# Patient Record
Sex: Female | Born: 1965 | Hispanic: Yes | Marital: Married | Smoking: Never smoker
Health system: Southern US, Community
[De-identification: ages and names within clinical notes are randomized; demographics above are authoritative.]

## PROBLEM LIST (undated history)

## (undated) DIAGNOSIS — M329 Systemic lupus erythematosus, unspecified: Secondary | ICD-10-CM

## (undated) DIAGNOSIS — N289 Disorder of kidney and ureter, unspecified: Secondary | ICD-10-CM

## (undated) DIAGNOSIS — IMO0002 Reserved for concepts with insufficient information to code with codable children: Secondary | ICD-10-CM

## (undated) DIAGNOSIS — Z970 Presence of artificial eye: Secondary | ICD-10-CM

---

## 2007-12-07 DIAGNOSIS — E669 Obesity, unspecified: Secondary | ICD-10-CM | POA: Insufficient documentation

## 2007-12-07 DIAGNOSIS — I10 Essential (primary) hypertension: Secondary | ICD-10-CM | POA: Insufficient documentation

## 2008-02-12 ENCOUNTER — Ambulatory Visit: Payer: Self-pay

## 2010-03-20 ENCOUNTER — Other Ambulatory Visit: Payer: Self-pay | Admitting: Family Medicine

## 2018-07-11 ENCOUNTER — Other Ambulatory Visit: Payer: Self-pay | Admitting: Internal Medicine

## 2018-07-11 DIAGNOSIS — Z1231 Encounter for screening mammogram for malignant neoplasm of breast: Secondary | ICD-10-CM

## 2019-09-20 ENCOUNTER — Other Ambulatory Visit: Payer: Self-pay | Admitting: Internal Medicine

## 2019-09-20 DIAGNOSIS — Z1231 Encounter for screening mammogram for malignant neoplasm of breast: Secondary | ICD-10-CM

## 2019-10-07 DIAGNOSIS — N184 Chronic kidney disease, stage 4 (severe): Secondary | ICD-10-CM | POA: Insufficient documentation

## 2019-11-01 ENCOUNTER — Other Ambulatory Visit: Payer: Self-pay

## 2019-11-01 ENCOUNTER — Ambulatory Visit
Admission: RE | Admit: 2019-11-01 | Discharge: 2019-11-01 | Disposition: A | Discharge status: Home / Self Care | Payer: BC Managed Care – PPO | Source: Ambulatory Visit | Attending: Internal Medicine | Admitting: Internal Medicine

## 2019-11-01 DIAGNOSIS — Z1231 Encounter for screening mammogram for malignant neoplasm of breast: Secondary | ICD-10-CM | POA: Diagnosis present

## 2020-01-17 ENCOUNTER — Other Ambulatory Visit: Payer: Self-pay | Admitting: Nephrology

## 2020-01-17 DIAGNOSIS — N184 Chronic kidney disease, stage 4 (severe): Secondary | ICD-10-CM

## 2020-01-28 ENCOUNTER — Other Ambulatory Visit: Payer: Self-pay

## 2020-01-28 DIAGNOSIS — Z1231 Encounter for screening mammogram for malignant neoplasm of breast: Secondary | ICD-10-CM

## 2020-05-07 DIAGNOSIS — N049 Nephrotic syndrome with unspecified morphologic changes: Secondary | ICD-10-CM | POA: Insufficient documentation

## 2020-06-09 ENCOUNTER — Other Ambulatory Visit: Payer: Self-pay

## 2020-06-09 ENCOUNTER — Inpatient Hospital Stay
Admission: EM | Admit: 2020-06-09 | Discharge: 2020-06-22 | DRG: 640 | Disposition: A | Discharge status: Home Health | Payer: BC Managed Care – PPO | Attending: Internal Medicine | Admitting: Internal Medicine

## 2020-06-09 ENCOUNTER — Emergency Department: Payer: BC Managed Care – PPO

## 2020-06-09 DIAGNOSIS — G4700 Insomnia, unspecified: Secondary | ICD-10-CM | POA: Diagnosis present

## 2020-06-09 DIAGNOSIS — N185 Chronic kidney disease, stage 5: Secondary | ICD-10-CM | POA: Diagnosis not present

## 2020-06-09 DIAGNOSIS — J181 Lobar pneumonia, unspecified organism: Secondary | ICD-10-CM | POA: Diagnosis not present

## 2020-06-09 DIAGNOSIS — Z970 Presence of artificial eye: Secondary | ICD-10-CM | POA: Diagnosis not present

## 2020-06-09 DIAGNOSIS — R569 Unspecified convulsions: Secondary | ICD-10-CM | POA: Diagnosis present

## 2020-06-09 DIAGNOSIS — D631 Anemia in chronic kidney disease: Secondary | ICD-10-CM | POA: Diagnosis present

## 2020-06-09 DIAGNOSIS — R739 Hyperglycemia, unspecified: Secondary | ICD-10-CM | POA: Diagnosis present

## 2020-06-09 DIAGNOSIS — N179 Acute kidney failure, unspecified: Secondary | ICD-10-CM | POA: Diagnosis present

## 2020-06-09 DIAGNOSIS — M329 Systemic lupus erythematosus, unspecified: Secondary | ICD-10-CM | POA: Diagnosis not present

## 2020-06-09 DIAGNOSIS — Y92003 Bedroom of unspecified non-institutional (private) residence as the place of occurrence of the external cause: Secondary | ICD-10-CM

## 2020-06-09 DIAGNOSIS — D638 Anemia in other chronic diseases classified elsewhere: Secondary | ICD-10-CM | POA: Diagnosis not present

## 2020-06-09 DIAGNOSIS — D72829 Elevated white blood cell count, unspecified: Secondary | ICD-10-CM | POA: Diagnosis not present

## 2020-06-09 DIAGNOSIS — J189 Pneumonia, unspecified organism: Secondary | ICD-10-CM | POA: Diagnosis present

## 2020-06-09 DIAGNOSIS — M3214 Glomerular disease in systemic lupus erythematosus: Secondary | ICD-10-CM | POA: Diagnosis present

## 2020-06-09 DIAGNOSIS — I248 Other forms of acute ischemic heart disease: Secondary | ICD-10-CM | POA: Diagnosis present

## 2020-06-09 DIAGNOSIS — Z6830 Body mass index (BMI) 30.0-30.9, adult: Secondary | ICD-10-CM

## 2020-06-09 DIAGNOSIS — N186 End stage renal disease: Secondary | ICD-10-CM | POA: Diagnosis present

## 2020-06-09 DIAGNOSIS — W19XXXA Unspecified fall, initial encounter: Secondary | ICD-10-CM | POA: Diagnosis present

## 2020-06-09 DIAGNOSIS — E878 Other disorders of electrolyte and fluid balance, not elsewhere classified: Secondary | ICD-10-CM | POA: Diagnosis present

## 2020-06-09 DIAGNOSIS — E871 Hypo-osmolality and hyponatremia: Principal | ICD-10-CM | POA: Diagnosis present

## 2020-06-09 DIAGNOSIS — F13288 Sedative, hypnotic or anxiolytic dependence with other sedative, hypnotic or anxiolytic-induced disorder: Secondary | ICD-10-CM | POA: Diagnosis not present

## 2020-06-09 DIAGNOSIS — T502X5A Adverse effect of carbonic-anhydrase inhibitors, benzothiadiazides and other diuretics, initial encounter: Secondary | ICD-10-CM | POA: Diagnosis present

## 2020-06-09 DIAGNOSIS — I120 Hypertensive chronic kidney disease with stage 5 chronic kidney disease or end stage renal disease: Secondary | ICD-10-CM | POA: Diagnosis present

## 2020-06-09 DIAGNOSIS — E872 Acidosis: Secondary | ICD-10-CM | POA: Diagnosis present

## 2020-06-09 DIAGNOSIS — Z20822 Contact with and (suspected) exposure to covid-19: Secondary | ICD-10-CM | POA: Diagnosis present

## 2020-06-09 DIAGNOSIS — Y999 Unspecified external cause status: Secondary | ICD-10-CM | POA: Diagnosis not present

## 2020-06-09 DIAGNOSIS — R630 Anorexia: Secondary | ICD-10-CM | POA: Diagnosis present

## 2020-06-09 DIAGNOSIS — R410 Disorientation, unspecified: Secondary | ICD-10-CM | POA: Diagnosis not present

## 2020-06-09 DIAGNOSIS — Z8616 Personal history of COVID-19: Secondary | ICD-10-CM | POA: Diagnosis not present

## 2020-06-09 DIAGNOSIS — N2581 Secondary hyperparathyroidism of renal origin: Secondary | ICD-10-CM | POA: Diagnosis present

## 2020-06-09 DIAGNOSIS — R011 Cardiac murmur, unspecified: Secondary | ICD-10-CM | POA: Diagnosis not present

## 2020-06-09 DIAGNOSIS — F13239 Sedative, hypnotic or anxiolytic dependence with withdrawal, unspecified: Secondary | ICD-10-CM | POA: Diagnosis not present

## 2020-06-09 HISTORY — DX: Presence of artificial eye: Z97.0

## 2020-06-09 HISTORY — DX: Systemic lupus erythematosus, unspecified: M32.9

## 2020-06-09 HISTORY — DX: Reserved for concepts with insufficient information to code with codable children: IMO0002

## 2020-06-09 HISTORY — DX: Disorder of kidney and ureter, unspecified: N28.9

## 2020-06-09 LAB — CBC WITH DIFFERENTIAL/PLATELET
Abs Immature Granulocytes: 0.14 10*3/uL — ABNORMAL HIGH (ref 0.00–0.07)
Basophils Absolute: 0 10*3/uL (ref 0.0–0.1)
Basophils Relative: 0 %
Eosinophils Absolute: 0 10*3/uL (ref 0.0–0.5)
Eosinophils Relative: 0 %
HCT: 24 % — ABNORMAL LOW (ref 36.0–46.0)
Hemoglobin: 8.8 g/dL — ABNORMAL LOW (ref 12.0–15.0)
Immature Granulocytes: 1 %
Lymphocytes Relative: 3 %
Lymphs Abs: 0.4 10*3/uL — ABNORMAL LOW (ref 0.7–4.0)
MCH: 27.3 pg (ref 26.0–34.0)
MCHC: 36.7 g/dL — ABNORMAL HIGH (ref 30.0–36.0)
MCV: 74.5 fL — ABNORMAL LOW (ref 80.0–100.0)
Monocytes Absolute: 0.5 10*3/uL (ref 0.1–1.0)
Monocytes Relative: 4 %
Neutro Abs: 11.7 10*3/uL — ABNORMAL HIGH (ref 1.7–7.7)
Neutrophils Relative %: 92 %
Platelets: 440 10*3/uL — ABNORMAL HIGH (ref 150–400)
RBC: 3.22 MIL/uL — ABNORMAL LOW (ref 3.87–5.11)
RDW: 11.5 % (ref 11.5–15.5)
WBC: 12.7 10*3/uL — ABNORMAL HIGH (ref 4.0–10.5)
nRBC: 0 % (ref 0.0–0.2)

## 2020-06-09 LAB — COMPREHENSIVE METABOLIC PANEL
ALT: 12 U/L (ref 0–44)
AST: 28 U/L (ref 15–41)
Albumin: 2.3 g/dL — ABNORMAL LOW (ref 3.5–5.0)
Alkaline Phosphatase: 87 U/L (ref 38–126)
Anion gap: 18 — ABNORMAL HIGH (ref 5–15)
BUN: 111 mg/dL — ABNORMAL HIGH (ref 6–20)
CO2: 18 mmol/L — ABNORMAL LOW (ref 22–32)
Calcium: 6.9 mg/dL — ABNORMAL LOW (ref 8.9–10.3)
Chloride: 69 mmol/L — ABNORMAL LOW (ref 98–111)
Creatinine, Ser: 6.24 mg/dL — ABNORMAL HIGH (ref 0.44–1.00)
GFR, Estimated: 7 mL/min — ABNORMAL LOW (ref >60)
Glucose, Bld: 150 mg/dL — ABNORMAL HIGH (ref 70–99)
Potassium: 4.4 mmol/L (ref 3.5–5.1)
Sodium: 105 mmol/L — CL (ref 135–145)
Total Bilirubin: 0.6 mg/dL (ref 0.3–1.2)
Total Protein: 6.5 g/dL (ref 6.5–8.1)

## 2020-06-09 LAB — RESP PANEL BY RT-PCR (FLU A&B, COVID) ARPGX2
Influenza A by PCR: NEGATIVE
Influenza B by PCR: NEGATIVE
SARS Coronavirus 2 by RT PCR: NEGATIVE

## 2020-06-09 LAB — TROPONIN I (HIGH SENSITIVITY)
Troponin I (High Sensitivity): 20 ng/L — ABNORMAL HIGH (ref <18)
Troponin I (High Sensitivity): 16 ng/L (ref <18)

## 2020-06-09 LAB — CBC
HCT: 22.3 % — ABNORMAL LOW (ref 36.0–46.0)
Hemoglobin: 8.1 g/dL — ABNORMAL LOW (ref 12.0–15.0)
MCH: 27.5 pg (ref 26.0–34.0)
MCHC: 36.3 g/dL — ABNORMAL HIGH (ref 30.0–36.0)
MCV: 75.6 fL — ABNORMAL LOW (ref 80.0–100.0)
Platelets: 416 10*3/uL — ABNORMAL HIGH (ref 150–400)
RBC: 2.95 MIL/uL — ABNORMAL LOW (ref 3.87–5.11)
RDW: 11.6 % (ref 11.5–15.5)
WBC: 12.1 10*3/uL — ABNORMAL HIGH (ref 4.0–10.5)
nRBC: 0 % (ref 0.0–0.2)

## 2020-06-09 LAB — SODIUM: Sodium: 107 mmol/L — CL (ref 135–145)

## 2020-06-09 LAB — LACTIC ACID, PLASMA: Lactic Acid, Venous: 1.9 mmol/L (ref 0.5–1.9)

## 2020-06-09 LAB — OSMOLALITY: Osmolality: 259 mOsm/kg — ABNORMAL LOW (ref 275–295)

## 2020-06-09 LAB — CREATININE, SERUM
Creatinine, Ser: 5.88 mg/dL — ABNORMAL HIGH (ref 0.44–1.00)
GFR, Estimated: 8 mL/min — ABNORMAL LOW (ref >60)

## 2020-06-09 MED ORDER — HEPARIN SODIUM (PORCINE) 5000 UNIT/ML IJ SOLN
5000.0000 [IU] | Freq: Three times a day (TID) | INTRAMUSCULAR | Status: DC
  Administered 2020-06-09 – 2020-06-22 (×36): 5000 [IU] via SUBCUTANEOUS
  Filled 2020-06-09 (×36): qty 1

## 2020-06-09 MED ORDER — SODIUM CHLORIDE 3 % IV BOLUS
250.0000 mL | Freq: Once | INTRAVENOUS | Status: DC
  Filled 2020-06-09: qty 500

## 2020-06-09 MED ORDER — POLYETHYLENE GLYCOL 3350 17 G PO PACK
17.0000 g | PACK | Freq: Every day | ORAL | Status: DC | PRN
  Administered 2020-06-13 – 2020-06-14 (×2): 17 g via ORAL
  Filled 2020-06-09 (×2): qty 1

## 2020-06-09 MED ORDER — LORAZEPAM 2 MG/ML IJ SOLN: 1.0000 mg | INTRAMUSCULAR | Status: DC | PRN

## 2020-06-09 MED ORDER — SODIUM CHLORIDE 0.9 % IV SOLN: INTRAVENOUS | Status: DC

## 2020-06-09 MED ORDER — SODIUM CHLORIDE 3 % IV BOLUS
100.0000 mL | Freq: Once | INTRAVENOUS | Status: AC
  Administered 2020-06-09: 100 mL via INTRAVENOUS
  Filled 2020-06-09: qty 500

## 2020-06-09 MED ORDER — ACETAMINOPHEN 325 MG PO TABS
650.0000 mg | ORAL_TABLET | ORAL | Status: DC | PRN
  Administered 2020-06-12: 650 mg via ORAL
  Filled 2020-06-09: qty 2

## 2020-06-09 MED ORDER — DOCUSATE SODIUM 100 MG PO CAPS
100.0000 mg | ORAL_CAPSULE | Freq: Two times a day (BID) | ORAL | Status: DC | PRN
  Administered 2020-06-12 – 2020-06-13 (×2): 100 mg via ORAL
  Filled 2020-06-09 (×2): qty 1

## 2020-06-09 MED ORDER — ONDANSETRON HCL 4 MG/2ML IJ SOLN
4.0000 mg | Freq: Four times a day (QID) | INTRAMUSCULAR | Status: DC | PRN
  Administered 2020-06-13: 4 mg via INTRAVENOUS
  Filled 2020-06-09: qty 2

## 2020-06-09 MED ORDER — LACTATED RINGERS IV BOLUS
1000.0000 mL | Freq: Once | INTRAVENOUS | Status: AC
  Administered 2020-06-09: 1000 mL via INTRAVENOUS

## 2020-06-09 NOTE — ED Triage Notes (Addendum)
Pt arrived via [facility] from home with c/o seizure. Per EMS, pt seen recently for respiratory infection and was given new medication. Per EMS, pt daughter states pt has not been acting the same since taking it (2 weeks).   Per pt daughter, pt was getting ready to get up and had a seizure that lasted 2-3 minutes from a sitting up position. Per pt daughter, pt eyes rolled back and both arms started shaking. Per daughter, pt was very sleepy afterwards. Per pt daughter, pt is acting more normal at this time  Per EMS, VSS  Per EMS, pt has hx of lupus and kidney disease.

## 2020-06-09 NOTE — ED Provider Notes (Signed)
[facility] Emergency Department Provider Note ____________________________________________   Event Date/Time   First MD Initiated Contact with Patient 06/09/20 1751     (approximate)  I have reviewed the triage vital signs and the nursing notes.  HISTORY  Chief Complaint Seizures   HPI Carol Hicks is a 55 y.o. femalewho presents to the ED for evaluation of seizure versus syncope.  Chart review indicates recent admission 1 month ago at [facility] due to increased swelling, found to have significant proteinuria, and had a renal biopsy with subsequent diagnosis of lupus.  She was started on Bumex, CellCept Diagnosed with an acute COVID-19 infection during this hospitalization. Later started on prednisone (10 days) and Plaquenil 3-4 days ago by nephrologist due to patient calling in with polyarthralgias and swelling.  Patient presents to the ED via EMS from home due to seizure versus syncope.  History obtained by daughter at the bedside, and patient.  Daughter reports that patient was lying supine, sat up on the side of the side of the bed and promptly fell backwards, trembling/shaking to her bilateral upper extremities symmetrically, and her eyes rolled back in her head.  Daughter reports this lasted 2-3 minutes, self resolved, and patient was sleepy afterwards for about 5-10 minutes, before returning to baseline.  Daughter reports that patient is at baseline now.  Patient reports that she has no recollection of what happened.  She reports that she has had emesis and loose stools over the past 24-36 hours.  She reports poor sleep, sleeping 1-2 hours per night for the past 2 weeks.  Denies fevers.  This has never happened before.  No past medical history on file.  Patient Active Problem List   Diagnosis Date Noted  . Hyponatremia 06/09/2020    No past surgical history on file.  Prior to Admission medications   Not on File    Allergies Patient has no known  allergies.  No family history on file.  Social History    Review of Systems  Constitutional: No fever/chills Eyes: No visual changes. ENT: No sore throat. Cardiovascular: Denies chest pain. Respiratory: Denies shortness of breath. Gastrointestinal: No abdominal pain.  No nausea, no vomiting.  No diarrhea.  No constipation. Genitourinary: Negative for dysuria. Musculoskeletal: Negative for back pain. Skin: Negative for rash. Neurological: Negative for headaches, focal weakness or numbness.  Positive for seizure versus syncope.   ____________________________________________   PHYSICAL EXAM:  VITAL SIGNS: Vitals:   06/09/20 1748 06/09/20 1830  BP: 137/75 134/63  Pulse:  87  Resp:  (!) 25  Temp:    SpO2:  100%     Constitutional: Alert and oriented. Well appearing and in no acute distress. Eyes: Conjunctivae are normal. PERRL. EOMI. Head: Atraumatic. Nose: No congestion/rhinnorhea. Mouth/Throat: Mucous membranes are dry.  Oropharynx non-erythematous. Small amount of blood noted in the right side of the tongue, but no significant laceration or active bleeding. Neck: No stridor. No cervical spine tenderness to palpation. Cardiovascular: Normal rate, regular rhythm. Grossly normal heart sounds.  Good peripheral circulation. Respiratory: Normal respiratory effort.  No retractions. Lungs CTAB. Gastrointestinal: Soft , nondistended, nontender to palpation. No CVA tenderness. Musculoskeletal:  No joint effusions. No signs of acute trauma. Pitting edema to bilateral lower extremities up to the knees symmetrically, no overlying skin changes. Neurologic:   No gross focal neurologic deficits are appreciated.  Cranial nerves II through XII intact 5/5 strength and sensation in all 4 extremities Skin:  Skin is warm, dry and intact. No  rash noted. Psychiatric: Mood and affect are normal. Speech and behavior are normal.  ____________________________________________   LABS (all  labs ordered are listed, but only abnormal results are displayed)  Labs Reviewed  COMPREHENSIVE METABOLIC PANEL - Abnormal; Notable for the following components:      Result Value   Sodium 105 (*)    Chloride 69 (*)    CO2 18 (*)    Glucose, Bld 150 (*)    BUN 111 (*)    Creatinine, Ser 6.24 (*)    Calcium 6.9 (*)    Albumin 2.3 (*)    GFR, Estimated 7 (*)    Anion gap 18 (*)    All other components within normal limits  CBC WITH DIFFERENTIAL/PLATELET - Abnormal; Notable for the following components:   WBC 12.7 (*)    RBC 3.22 (*)    Hemoglobin 8.8 (*)    HCT 24.0 (*)    MCV 74.5 (*)    MCHC 36.7 (*)    Platelets 440 (*)    Neutro Abs 11.7 (*)    Lymphs Abs 0.4 (*)    Abs Immature Granulocytes 0.14 (*)    All other components within normal limits  TROPONIN I (HIGH SENSITIVITY) - Abnormal; Notable for the following components:   Troponin I (High Sensitivity) 20 (*)    All other components within normal limits  RESP PANEL BY RT-PCR (FLU A&B, COVID) ARPGX2  LACTIC ACID, PLASMA  URINALYSIS, COMPLETE (UACMP) WITH MICROSCOPIC  HIV ANTIBODY (ROUTINE TESTING W REFLEX)  CBC  CREATININE, SERUM  CBC  BASIC METABOLIC PANEL  MAGNESIUM  PHOSPHORUS  SODIUM  OSMOLALITY  OSMOLALITY, URINE  SODIUM, URINE, RANDOM  TROPONIN I (HIGH SENSITIVITY)   ____________________________________________  12 Lead EKG   ____________________________________________  RADIOLOGY  ED MD interpretation: No evidence of acute intracranial pathology.  Official radiology report(s): CT Head Wo Contrast  Result Date: 06/09/2020 CLINICAL DATA:  Mental status change. EXAM: CT HEAD WITHOUT CONTRAST TECHNIQUE: Contiguous axial images were obtained from the base of the skull through the vertex without intravenous contrast. COMPARISON:  None. FINDINGS: Brain: The ventricles are in the midline without mass effect or shift. No extra-axial fluid collections are identified. The gray-white differentiation is  maintained. No findings for acute intracranial process such as hemorrhage or infarction. No mass lesions. The brainstem and cerebellum are unremarkable. Are few tiny scattered parenchymal calcifications which could be due to remote inflammation or infection. Vascular: Scattered age advanced vascular calcifications. No aneurysm or hyperdense vessels. Skull: No skull fracture or bone lesions. Sinuses/Orbits: Chronic appearing right-sided maxillary sinusitis with a thickened sinus wall. There is also mild right-sided ethmoid sinus disease. 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 Other: No scalp lesions or scalp hematoma. IMPRESSION: 1. No acute intracranial findings or mass lesions. 2. Chronic appearing right-sided maxillary sinusitis. Electronically Signed   By: Marijo Sanes M.D.   On: 06/09/2020 18:40    ____________________________________________   PROCEDURES and INTERVENTIONS  Procedure(s) performed (including Critical Care):  .1-3 Lead EKG Interpretation Performed by: Vladimir Crofts, MD Authorized by: Vladimir Crofts, MD     Interpretation: normal     ECG rate:  80   ECG rate assessment: normal     Rhythm: sinus rhythm     Ectopy: none     Conduction: normal    Ultrasound ED Peripheral IV (Provider)  Date/Time: 06/09/2020 7:38 PM Performed by: Vladimir Crofts, MD Authorized by: Vladimir Crofts, MD  Procedure details:    Indications: hydration and poor IV access     Skin Prep: chlorhexidine gluconate     Location: Left basilic vein.   Angiocath:  20 G   Bedside Ultrasound Guided: Yes     Images: not archived     Patient tolerated procedure without complications: Yes     Dressing applied: Yes   .Critical Care Performed by: Vladimir Crofts, MD Authorized by: Vladimir Crofts, MD   Critical care provider statement:    Critical care time (minutes):  45   Critical care was necessary to treat or prevent imminent or life-threatening  deterioration of the following conditions:  CNS failure or compromise, dehydration and metabolic crisis   Critical care was time spent personally by me on the following activities:  Discussions with consultants, evaluation of patient's response to treatment, examination of patient, ordering and performing treatments and interventions, ordering and review of laboratory studies, ordering and review of radiographic studies, pulse oximetry, re-evaluation of patient's condition, obtaining history from patient or surrogate and review of old charts    Medications  sodium chloride 3% (hypertonic) IV bolus 100 mL (100 mLs Intravenous New Bag/Given 06/09/20 1935)  docusate sodium (COLACE) capsule 100 mg (has no administration in time range)  polyethylene glycol (MIRALAX / GLYCOLAX) packet 17 g (has no administration in time range)  heparin injection 5,000 Units (has no administration in time range)  acetaminophen (TYLENOL) tablet 650 mg (has no administration in time range)  ondansetron (ZOFRAN) injection 4 mg (has no administration in time range)  lactated ringers bolus 1,000 mL (1,000 mLs Intravenous New Bag/Given 06/09/20 1809)   ____________________________________________   MDM / ED COURSE   55 year old woman with recent diagnosis of lupus presents to the ED after witnessed seizure-like activity, found to have sodium of 105 and acute renal failure requiring hypertonic saline and ICU admission.  Patient presents hemodynamically stable with normal vitals.  Exam demonstrates evidence of volume overload, but no neurovascular deficits or signs of trauma.  CT imaging of the brain without evidence of ICH, CVA or mass.  Blood work with significant metabolic derangements, most significant for acute renal failure that appears prerenal with elevated BUN, and a sodium of 105.  This is the likely source of her seizure-like activity, and she did likely have a seizure.  I discussed the case with nephrology on-call, who  recommends a small amount of hypertonic saline (50-100cc) and close monitoring of BMPs.  I then discussed the case with ICU overnight NP, who agrees to admit.  Ultrasound IV placed by me, patient received 1 L of LR and then 100 cc of hypertonic saline while in the ED.  No seizure activity while in the ED.  Clinical Course as of 06/09/20 1941  Tue Jun 09, 2020  [place] Nurse informs me of sodium of 105.  I ordered hypertonic saline.  Nurse indicates that patient does not have good IV access.  I request ultrasound machine immediately brought to the room so I can place access [DS]  1907 USIV to left upper arm [DS]  1916 I speak with Dr. Candiss Norse, nephro [DS]  1917 3% saline only 50-100cc Then NS gtt  BMP Q2H [DS]  1922 I speak with Hinton Dyer, ICU NP, who agrees to admit [DS]    Clinical Course User Index [DS] Vladimir Crofts, MD    ____________________________________________   FINAL CLINICAL IMPRESSION(S) / ED DIAGNOSES  Final diagnoses:  Seizure ([place])  Hyponatremia  Acute renal failure, unspecified acute renal failure  type [facility])     ED Discharge Orders    None       Randel Hargens Tamala Julian   Note:  This document was prepared using Dragon voice recognition software and may include unintentional dictation errors.   Vladimir Crofts, MD 06/09/20 [phone]

## 2020-06-09 NOTE — H&P (Addendum)
NAME:  Carol Hicks, MRN:  [id number], DOB:  November 29, 1965, LOS: 0 ADMISSION DATE:  06/09/2020, CONSULTATION DATE: 06/09/2020  REFERRING MD: Dr. Tamala Julian, CHIEF COMPLAINT: Seizures   History of Present Illness:  This is a 55 yo female who presented to [facility] ER on 04/5 from home with seizure activity.  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  There is concern pt may be close to ESRD and dialysis and/or kidney transplant.  During a previous hospitalization she was started on bumex 2 mg bid and sodium bicarb.  She was recently diagnosed with a viral URI with recommendations of supportive care and close outpatient follow-up on 03/25.  Pts daughter reported the pt has not been "acting the same" for about 2 weeks.  She also reported the pt went from a lying position to a seated position she had a seizure today that lasted for about 2-3 minutes. During the seizure the pts eyes rolled back and both her arms were shaking.  Following the seizure the pt appeared very sleepy for about 5-10 minutes.  EMS reported the pts vital signs were stable when they arrived.  ED Course Upon arrival to the ER pts daughter reported the pt appeared at her baseline, but the pt had no recollection of what happened prior to arrival in the ER.  Pt reported she has had emesis when taking her sodium bicarb tablet and loose stools over the past 24-36 hrs.  She also reported insomnia, on average only able to sleep 1-2 hrs per night over the past 2 weeks.  Lab results revealed Na+ 105, chloride 69, CO2 18, glucose 150, BUN 111, creatinine 6.24, calcium 6.9, anion gap 18, albumin 2.3, troponin 20, wbc 12.7, and hgb 8.8, platelets 440.  CT Head negative for acute intracranial findings or mass lesions.  She received 500 ml LR bolus.  ER physician contacted on call nephrologist Dr. Candiss Norse and he recommended the following:  3% saline 50 ml bolus x1 to raise Na+ above 110 with slow correction thereafter (goal of ~8 Meq correction in 24hrs).  Received 100 ml bolus of hypertonic saline per ER physician.  Pertinent  Medical History  COVID-19 dx Oct 2021 (received monoclonal antibody infusion) HTN  Stage IV CKD  Nephrotic Syndrome  Lupus  Pelvic Lymphadenopathy  Adiposity    Significant Hospital Events: Including procedures, antibiotic start and stop dates in addition to other pertinent events   . Pt admitted to ICU with hyponatremia Na 105 received 3% saline 50 ml bolus   Interim History / Subjective:  Resting in bed slightly confused   Objective   Blood pressure 134/63, pulse 87, temperature 98.2 F (36.8 C), temperature source Oral, resp. rate (!) 25, SpO2 100 %.       No intake or output data in the 24 hours ending 06/09/20 1930 There were no vitals filed for this visit.  Examination: General: acutely ill appearing female, resting in bed NAD HENT: supple, no JVD  Lungs: clear throughout, even, non labored  Cardiovascular: sinus rhythm, no R/G, 2+ radial/1+ distal pulses, 2+ bilateral lower extremity pitting edema  Abdomen: +BS x4, obese, soft, non tender, non distended  Extremities: normal tone, moves all extremities Neuro: confused, follows commands, awake, right pupil 2 mm reactive, left prosthetic eye  Labs/imaging that I havepersonally reviewed  (right click and "Reselect all SmartList  Selections" daily)  Lab results reviewed 06/09/2020: Na+ 105, chloride 69, CO2 18, glucose 150, BUN 111, creatinine 6.24, calcium 6.9, anion gap 18, albumin 2.3, troponin 20, wbc 12.7, and hgb 8.8, platelets 440 CT Head 06/09/2020: negative for acute intracranial findings or mass lesions  Resolved Hospital Problem list   N/A  Assessment & Plan:   Severe hyponatremia and hypochloremia likely diuretic induced Anion gap metabolic acidosis  Chronic 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   Continuous telemetry monitoring  Serial Na+ monitoring  Nephrology consulted appreciate input~3% saline 50 ml bolus x1 to raise Na+ above 110 with slow correction thereafter with goal of ~8 Meq correction in 24 hrs to avoid correcting too fast  Urine sodium, serum osmolality, and urine osmolality results pending Seizure precautions  Trend BMP  Replace electrolytes as indicated  Monitor UOP Avoid nephrotoxic medications  Hold outpatient bumex   Mildly elevated troponin likely secondary to demand ischemia in the setting of severe hyponatremia  Trend troponin   Anemia without obvious acute blood loss  Trend CBC  Will resume outpatient ferrous sulfate  Monitor for s/sx of bleeding and transfuse for hgb <7  Leukocytosis without obvious signs of infections Trend WBC and monitor fever curve  No indication for empiric abx at this time   Lupus Resume outpatient cellcept once able to tolerate po's  Hyperglycemia  CBG's ac/hs SSI   Seizure activity suspected secondary to severe hyponatremia~CT Head 04/5 negative  Acute encephalopathy  Seizure precautions  Prn ativan for seizure activity  Frequent reorientation  Best practice (right click and "Reselect all SmartList Selections" daily)  Diet:  Oral Pain/Anxiety/Delirium protocol (if indicated): No VAP protocol (if indicated): Yes DVT prophylaxis: Subcutaneous Heparin GI prophylaxis: H2B Glucose control:  SSI Yes Central venous access:  N/A Arterial line:  N/A Foley:  N/A Mobility:  bed rest  PT consulted: N/A Last date of multidisciplinary goals of care discussion [N/A] Code Status:  full code Disposition: ICU   Labs   CBC: Recent Labs  Lab 06/09/20 1747  WBC 12.7*  NEUTROABS 11.7*  HGB 8.8*  HCT 24.0*  MCV 74.5*  PLT 440*    Basic Metabolic Panel: Recent Labs  Lab 06/09/20 1747  NA 105*  K 4.4  CL 69*  CO2 18*  GLUCOSE 150*  BUN 111*  CREATININE 6.24*  CALCIUM 6.9*   GFR: CrCl cannot be calculated  (Unknown ideal weight.). Recent Labs  Lab 06/09/20 1747  WBC 12.7*  LATICACIDVEN 1.9    Liver Function Tests: Recent Labs  Lab 06/09/20 1747  AST 28  ALT 12  ALKPHOS 87  BILITOT 0.6  PROT 6.5  ALBUMIN 2.3*   No results for input(s): LIPASE, AMYLASE in the last 168 hours. No results for input(s): AMMONIA in the last 168 hours.  ABG No results found for: PHART, PCO2ART, PO2ART, HCO3, TCO2, ACIDBASEDEF, O2SAT   Coagulation Profile: No results for input(s): INR, PROTIME in the last 168 hours.  Cardiac Enzymes: No results for input(s): CKTOTAL, CKMB, CKMBINDEX, TROPONINI in the last 168 hours.  HbA1C: No results found for: HGBA1C  CBG: No results for input(s): GLUCAP in the last 168 hours.  Review of Systems: Positives in BOLD  Gen: Denies fever, chills, weight change, fatigue, night sweats HEENT: Denies blurred vision, double vision, hearing loss, tinnitus, sinus congestion, rhinorrhea, sore throat, neck stiffness, dysphagia PULM: Denies shortness of breath, cough, sputum production, hemoptysis, wheezing CV: Denies chest pain, edema, orthopnea, paroxysmal nocturnal dyspnea, palpitations GI: abdominal  pain, nausea, vomiting, loose stools, diarrhea, hematochezia, melena, constipation, change in bowel habits GU: Denies dysuria, hematuria, polyuria, oliguria, urethral discharge Endocrine: Denies hot or cold intolerance, polyuria, polyphagia or appetite change Derm: Denies rash, dry skin, scaling or peeling skin change Heme: Denies easy bruising, bleeding, bleeding gums Neuro: seizures, headache, numbness, weakness, slurred speech, loss of memory or consciousness  Past Medical History:  She,  has no past medical history on file.   Surgical History:  No past surgical history on file.   Social History:      Family History:  Her family history is not on file.   Allergies No Known Allergies   Home Medications  Prior to Admission medications   Not on File      Critical care time: 45 minutes    -Updated pts daughter at bedside regarding plan of care and pt condition all questions were answered.  Marda Stalker, [place] Pager [phone] (please enter 7 digits) PCCM Consult Pager [phone] (please enter 7 digits)

## 2020-06-09 NOTE — Consult Note (Signed)
[facility] for Electrolyte Monitoring and Replacement   Recent Labs: Potassium (mmol/L)  Date Value  06/09/2020 4.4   Calcium (mg/dL)  Date Value  06/09/2020 6.9 (L)   Albumin (g/dL)  Date Value  06/09/2020 2.3 (L)   Sodium (mmol/L)  Date Value  06/09/2020 105 (LL)    Assessment: Patient is a 55 y/o F with medical history including CKD with recent diagnosis of lupus who presented to the ED 4/5 with concern for seizure activity. Labs on admission notable for several abnormalities including severe hyponatremia with a sodium of 105, Scr 6.24. Per outside records, last labs with Na 130, Scr 5.14 on 05/29/20.   Goal of Therapy:  Electrolytes within normal limits Correct sodium no faster than 12 mmol/L/day  Plan:  --No electrolyte replacement warranted at this time --Will continue to follow along  Benita Gutter 06/09/2020 7:43 PM

## 2020-06-09 NOTE — Progress Notes (Signed)
Case discussed with Dr Tamala Julian in the ER. 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 She is followed at [facility] nephrology. Review of outpatient notes in care everywhere suggest that Carol Hicks may be close to ESRD and DIALYSIS and/or kidney transplant are being contemplated.  Carol Hicks presented to the ER for seizures. Found to have severe hyponatremia. Given 500 mL of LR so far in the emergency room.  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 Goal of ~ 8 Meq correction in 24 hrs Carol Hicks's history suggests diuretic induced hyponatremia - Na expected to correct fast with iv fluids  Full consult to follow in AM

## 2020-06-09 NOTE — ED Notes (Signed)
MD Tamala Julian notified of critical sodium level of 105. MD Tamala Julian at bedside to place ultrasound IV at this time.

## 2020-06-09 NOTE — ED Notes (Signed)
Pt unable to void on bedpan. Pt was bladder scanned. 177 ml noted in bladder. Pt was educated on possibly draining bladder with an in/out cath in order to collect specimen. Pt refused and stated "I rather wait". ICU NP and ICU RN notified.

## 2020-06-10 ENCOUNTER — Encounter: Payer: Self-pay | Admitting: Internal Medicine

## 2020-06-10 DIAGNOSIS — E871 Hypo-osmolality and hyponatremia: Secondary | ICD-10-CM | POA: Diagnosis not present

## 2020-06-10 LAB — URINALYSIS, COMPLETE (UACMP) WITH MICROSCOPIC
Bilirubin Urine: NEGATIVE
Glucose, UA: 50 mg/dL — AB
Ketones, ur: NEGATIVE mg/dL
Leukocytes,Ua: NEGATIVE
Nitrite: NEGATIVE
Protein, ur: >=300 mg/dL — AB
Specific Gravity, Urine: 1.011 (ref 1.005–1.030)
pH: 5 (ref 5.0–8.0)

## 2020-06-10 LAB — SODIUM
Sodium: 107 mmol/L — CL (ref 135–145)
Sodium: 106 mmol/L — CL (ref 135–145)
Sodium: 107 mmol/L — CL (ref 135–145)
Sodium: 109 mmol/L — CL (ref 135–145)
Sodium: 112 mmol/L — CL (ref 135–145)
Sodium: 111 mmol/L — CL (ref 135–145)
Sodium: 109 mmol/L — CL (ref 135–145)

## 2020-06-10 LAB — CBC
HCT: 21.4 % — ABNORMAL LOW (ref 36.0–46.0)
Hemoglobin: 7.9 g/dL — ABNORMAL LOW (ref 12.0–15.0)
MCH: 27.6 pg (ref 26.0–34.0)
MCHC: 36.9 g/dL — ABNORMAL HIGH (ref 30.0–36.0)
MCV: 74.8 fL — ABNORMAL LOW (ref 80.0–100.0)
Platelets: 377 10*3/uL (ref 150–400)
RBC: 2.86 MIL/uL — ABNORMAL LOW (ref 3.87–5.11)
RDW: 11.5 % (ref 11.5–15.5)
WBC: 11.7 10*3/uL — ABNORMAL HIGH (ref 4.0–10.5)
nRBC: 0 % (ref 0.0–0.2)

## 2020-06-10 LAB — OSMOLALITY, URINE: Osmolality, Ur: 252 mOsm/kg — ABNORMAL LOW (ref 300–900)

## 2020-06-10 LAB — BASIC METABOLIC PANEL
Anion gap: 17 — ABNORMAL HIGH (ref 5–15)
BUN: 104 mg/dL — ABNORMAL HIGH (ref 6–20)
CO2: 19 mmol/L — ABNORMAL LOW (ref 22–32)
Calcium: 6.8 mg/dL — ABNORMAL LOW (ref 8.9–10.3)
Chloride: 72 mmol/L — ABNORMAL LOW (ref 98–111)
Creatinine, Ser: 5.82 mg/dL — ABNORMAL HIGH (ref 0.44–1.00)
GFR, Estimated: 8 mL/min — ABNORMAL LOW (ref >60)
Glucose, Bld: 98 mg/dL (ref 70–99)
Potassium: 4.1 mmol/L (ref 3.5–5.1)
Sodium: 108 mmol/L — CL (ref 135–145)

## 2020-06-10 LAB — MAGNESIUM: Magnesium: 1.9 mg/dL (ref 1.7–2.4)

## 2020-06-10 LAB — SODIUM, URINE, RANDOM: Sodium, Ur: 15 mmol/L

## 2020-06-10 LAB — HIV ANTIBODY (ROUTINE TESTING W REFLEX)
HIV Screen 4th Generation wRfx: NONREACTIVE
HIV Screen 4th Generation wRfx: NONREACTIVE

## 2020-06-10 LAB — GLUCOSE, CAPILLARY: Glucose-Capillary: 121 mg/dL — ABNORMAL HIGH (ref 70–99)

## 2020-06-10 LAB — MRSA PCR SCREENING: MRSA by PCR: NEGATIVE

## 2020-06-10 LAB — PHOSPHORUS: Phosphorus: 10.1 mg/dL — ABNORMAL HIGH (ref 2.5–4.6)

## 2020-06-10 MED ORDER — SODIUM CHLORIDE 3 % IV SOLN
INTRAVENOUS | Status: AC
  Filled 2020-06-10 (×2): qty 500

## 2020-06-10 MED ORDER — MYCOPHENOLATE MOFETIL 250 MG PO CAPS
500.0000 mg | ORAL_CAPSULE | Freq: Two times a day (BID) | ORAL | Status: DC
  Administered 2020-06-10 – 2020-06-22 (×24): 500 mg via ORAL
  Filled 2020-06-10 (×26): qty 2

## 2020-06-10 MED ORDER — CHLORHEXIDINE GLUCONATE CLOTH 2 % EX PADS
6.0000 | MEDICATED_PAD | Freq: Every day | CUTANEOUS | Status: DC
  Administered 2020-06-10 – 2020-06-22 (×10): 6 via TOPICAL

## 2020-06-10 MED ORDER — SODIUM CHLORIDE 0.9 % IV SOLN: INTRAVENOUS | Status: DC

## 2020-06-10 MED ORDER — SODIUM CHLORIDE 3 % IV BOLUS
100.0000 mL | Freq: Once | INTRAVENOUS | Status: AC
  Administered 2020-06-10: 100 mL via INTRAVENOUS
  Filled 2020-06-10: qty 500

## 2020-06-10 NOTE — Progress Notes (Signed)
Carol Hicks notified of patients sodium of 109.

## 2020-06-10 NOTE — Progress Notes (Signed)
Jearld Adjutant Notified of patients sodium level of 112. Awaiting for orders. He will speak with Dr Candiss Norse.

## 2020-06-10 NOTE — Progress Notes (Signed)
Called lab regarding 17:00 sodium check. Per lab they will come up here to draw now.

## 2020-06-10 NOTE — Progress Notes (Signed)
PHARMACY CONSULT NOTE - FOLLOW UP  Pharmacy Consult for Electrolyte Monitoring and Replacement   Recent Labs: Potassium (mmol/L)  Date Value  06/10/2020 4.1   Magnesium (mg/dL)  Date Value  06/10/2020 1.9   Calcium (mg/dL)  Date Value  06/10/2020 6.8 (L)   Albumin (g/dL)  Date Value  06/09/2020 2.3 (L)   Phosphorus (mg/dL)  Date Value  06/10/2020 10.1 (H)   Sodium (mmol/L)  Date Value  06/10/2020 107 (LL)   Corrected Ca: 8.16  Assessment: 55 yo F presented with c/o seizure. Pt Na was found to be 105. Pharmacy has been consulted to monitor Na levels while on 3% NaCl infusion.   Goal of Therapy:  --Increase in sodium of 8 mEq in 24 hr period --If Na increases > 4 mEq/L in 2 hours or > 6 mEq/L in 4 hours call RN to stop infusion and notify MD   Date Time Na level 4/5 1747 105 4/5 2040 3% NaCl bolus given 4/5 2243 107 4/6 0032 107 4/6 0253 106 4/6 0445 3% NaCl bolus given  4/6 0454 108 4/6 0619 107 4/6 0745 3% NaCl continuous infusion started @ 0745 4/6 0855 109 4/6 1251 112  Plan:  --Na 109>112 - Continue 3% NaCl continuous infusion --Monitor sodium levels every 4 hours to ensure level does not increase by more than > 4 mEq/L in 2 hours or > 6 mEq/L in 4 hours --No additional replacement needed at this time --Monitor electrolyte levels with AM labs  Benn Moulder, PharmD Pharmacy Resident  06/10/2020 12:48 PM

## 2020-06-10 NOTE — Plan of Care (Signed)
Nutrition Education Note  RD consulted for Renal Education.   Case discussed with RN; pt and family very overwhelmed with renal diet and have multiple diet related questions.   Spoke with pt daughter and husband at bedside. They report pt was recently diagnosed with lupus and CD last month. They have been trying to follow a renal diet at home and admittedly have been restricting her diet to help with sodium, potassium, and phosphorus intake. Commonly consumed foods include [place], broccoli, chicken, rice, chicken and rice soup with tomatoes and peppers, and enchiladas. Reviewed diet recall and provided specific examples of ways to decrease sodium, potassium, and phosphorus in diet. Provided family with a lot of emotional support and reassurance. RD spent approximately 45 minutes educating family.    Provided "Chronic Kidney Disease Stages 3-5 Nutrition Therapy" to patient/family. Reviewed food groups and provided written recommended serving sizes specifically determined for patient's current nutritional status.   Explained why diet restrictions are needed and provided lists of foods to limit/avoid that are high potassium, sodium, and phosphorus. Provided specific recommendations on safer alternatives of these foods. Strongly encouraged compliance of this diet.   Discussed importance of protein intake at each meal and snack. Provided examples of how to maximize protein intake throughout the day. Discussed renal-friendly beverage options.  Encouraged pt and family to request additional RD visit as needed. RD will also refer pt for outpatient education for further support ([facility]). Teach back method used.  Expect fair to good compliance.  Current diet order is renal with 1.2 L fluid restriction, patient is consuming approximately n/a% of meals at this time. Labs and medications reviewed. No further nutrition interventions warranted at this  time. RD contact information provided. If additional nutrition issues arise, please re-consult RD.  Loistine Chance, RD, LDN, [place] Registered Dietitian II Certified Diabetes Care and Education Specialist Please refer to [facility] for RD and/or RD on-call/weekend/after hours pager

## 2020-06-10 NOTE — Progress Notes (Signed)
Order requisition for prayer. Patient was awake and daughter Wells Guiles bedside. Patient is spanish speaking, but I was able to pray with and for patient and daughter as she translated.

## 2020-06-10 NOTE — Consult Note (Signed)
[facility] Consult Note:    Date of Admission:  06/09/2020           Reason for Consult:  Hyponatremia   Referring Provider: Etheleen Nicks, MD Primary Care Provider: Pcp, No   History of Presenting Illness:  Carol Hicks is a 55 y.o. 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 She is followed at [facility] nephrology. Review of outpatient notes in care everywhere suggest that patient may be close to ESRD and Dialysis and/or kidney transplant are being contemplated.  Patient presented to the ER for seizures at home. Found to have severe hyponatremia. Given 500 mL of LR and started on 3% saline  presenting sodium of 105 BUN 111, creatinine 6.24     Review of Systems: ROS- not reliable  Past Medical History:  Diagnosis Date  . Kidney disease   . Lupus ([place])   . Prosthetic eye globe     Social History   Tobacco Use  . Smoking status: Never Smoker  . Smokeless tobacco: Never Used  Vaping Use  . Vaping Use: Never used  Substance Use Topics  . Alcohol use: Not Currently  . Drug use: Never    No family history on file.   OBJECTIVE: Blood pressure 119/67, pulse 90, temperature 98.7 F (37.1 C), resp. rate 18, height '5\' 2"'$  (1.575 m), weight 69.5 kg, SpO2 97 %.  Physical Exam   Physical Exam: General:  No acute distress, laying in the bed  HEENT  anicteric, moist oral mucous membrane  Pulm/lungs  normal breathing effort, lungs are clear to auscultation  CVS/Heart  regular rhythm, no rub or gallop  Abdomen:   Soft, nontender  Extremities:  No peripheral edema  Neurologic:  Alert, oriented to self, able to follow commands  Skin:  No acute rashes     Lab Results Lab Results   Component Value Date   WBC 11.7 (H) 06/10/2020   HGB 7.9 (L) 06/10/2020   HCT 21.4 (L) 06/10/2020   MCV 74.8 (L) 06/10/2020   PLT 377 06/10/2020    Lab Results  Component Value Date   CREATININE 5.82 (H) 06/10/2020   BUN 104 (H) 06/10/2020   NA 107 (LL) 06/10/2020   K 4.1 06/10/2020   CL 72 (L) 06/10/2020   CO2 19 (L) 06/10/2020    Lab Results  Component Value Date   ALT 12 06/09/2020   AST 28 06/09/2020   ALKPHOS 87 06/09/2020   BILITOT 0.6 06/09/2020     Microbiology: Recent Results (from the past 240 hour(s))  Resp Panel by RT-PCR (Flu A&B, Covid) Nasopharyngeal Swab     Status: None   Collection Time: 06/09/20  7:37 PM   Specimen: Nasopharyngeal Swab; Nasopharyngeal(NP) swabs in vial transport medium  Result Value Ref Range Status   SARS Coronavirus 2 by RT PCR NEGATIVE NEGATIVE Final    Comment: (NOTE) SARS-CoV-2 target nucleic acids are NOT DETECTED.  The SARS-CoV-2 RNA is generally detectable in upper respiratory specimens during the acute phase of infection. The lowest concentration of SARS-CoV-2 viral copies this assay can detect is 138 copies/mL. A negative result does not preclude SARS-Cov-2 infection and should not be used as the sole basis for treatment or other patient management  decisions. A negative result may occur with  improper specimen collection/handling, submission of specimen other than nasopharyngeal swab, presence of viral mutation(s) within the areas targeted by this assay, and inadequate number of viral copies(<138 copies/mL). A negative result must be combined with clinical observations, patient history, and epidemiological information. The expected result is Negative.  Fact Sheet for Patients:  EntrepreneurPulse.com.au  Fact Sheet for Healthcare Providers:  IncredibleEmployment.be  This test is no t yet approved or cleared by the [place] FDA and  has been authorized for detection and/or  diagnosis of SARS-CoV-2 by FDA under an Emergency Use Authorization (EUA). This EUA will remain  in effect (meaning this test can be used) for the duration of the COVID-19 declaration under Section 564(b)(1) of the Act, 21 U.S.C.section 360bbb-3(b)(1), unless the authorization is terminated  or revoked sooner.       Influenza A by PCR NEGATIVE NEGATIVE Final   Influenza B by PCR NEGATIVE NEGATIVE Final    Comment: (NOTE) The Xpert Xpress SARS-CoV-2/FLU/RSV plus assay is intended as an aid in the diagnosis of influenza from Nasopharyngeal swab specimens and should not be used as a sole basis for treatment. Nasal washings and aspirates are unacceptable for Xpert Xpress SARS-CoV-2/FLU/RSV testing.  Fact Sheet for Patients: EntrepreneurPulse.com.au  Fact Sheet for Healthcare Providers: IncredibleEmployment.be  This test is not yet approved or cleared by the [place] FDA and has been authorized for detection and/or diagnosis of SARS-CoV-2 by FDA under an Emergency Use Authorization (EUA). This EUA will remain in effect (meaning this test can be used) for the duration of the COVID-19 declaration under Section 564(b)(1) of the Act, 21 U.S.C. section 360bbb-3(b)(1), unless the authorization is terminated or revoked.  Performed at [facility], [address] [place], [place] [address]   MRSA PCR Screening     Status: None   Collection Time: 06/10/20 12:24 AM   Specimen: Nasopharyngeal  Result Value Ref Range Status   MRSA by PCR NEGATIVE NEGATIVE Final    Comment:        The GeneXpert MRSA Assay (FDA approved for NASAL specimens only), is one component of a comprehensive MRSA colonization surveillance program. It is not intended to diagnose MRSA infection nor to guide or monitor treatment for MRSA infections. Performed at [facility], [place], [place] [address]     Medications: Scheduled  Meds: . Chlorhexidine Gluconate Cloth  6 each Topical Q0600  . heparin  5,000 Units Subcutaneous Q8H   Continuous Infusions: . sodium chloride (hypertonic) 30 mL/hr at 06/10/20 0745   PRN Meds:.acetaminophen, docusate sodium, LORazepam, ondansetron (ZOFRAN) IV, polyethylene glycol  No Known Allergies  Urinalysis: Recent Labs    06/10/20 0606  COLORURINE YELLOW*  LABSPEC 1.011  PHURINE 5.0  GLUCOSEU 50*  HGBUR MODERATE*  BILIRUBINUR NEGATIVE  KETONESUR NEGATIVE  PROTEINUR >=300*  NITRITE NEGATIVE  LEUKOCYTESUR NEGATIVE      Imaging: CT Head Wo Contrast  Result Date: 06/09/2020 CLINICAL DATA:  Mental status change. EXAM: CT HEAD WITHOUT CONTRAST TECHNIQUE: Contiguous axial images were obtained from the base of the skull through the vertex without intravenous contrast. COMPARISON:  None. FINDINGS: Brain: The ventricles are in the midline without mass effect or shift. No extra-axial fluid collections are identified. The gray-white differentiation is maintained. No findings for acute intracranial process such as hemorrhage or infarction. No mass lesions. The brainstem and cerebellum are unremarkable. Are few tiny scattered parenchymal calcifications which could be due to remote inflammation or infection.  Vascular: Scattered age advanced vascular calcifications. No aneurysm or hyperdense vessels. Skull: No skull fracture or bone lesions. Sinuses/Orbits: Chronic appearing right-sided maxillary sinusitis with a thickened sinus wall. There is also mild right-sided ethmoid sinus disease. 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 Other: No scalp lesions or scalp hematoma. IMPRESSION: 1. No acute intracranial findings or mass lesions. 2. Chronic appearing right-sided maxillary sinusitis. Electronically Signed   By: Marijo Sanes M.D.   On: 06/09/2020 18:40      Assessment/Plan:  BANDI RENSLOW is a 55 y.o. 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 EM with membranous pattern and extraglomerular immune deposits as well as some inflammation and fibrosis in the interstitium by EM. Favoring autoimmune process."  Was on Cell cept, plaquenil and prednisone as outpatient  -will restart cellcept and monitor No uremic symptoms Electrolytes and Volume status are acceptable No acute indication for Dialysis at present    Johnni Wunschel 06/10/20

## 2020-06-10 NOTE — Progress Notes (Signed)
NAME:  Carol Hicks, MRN:  [id number], DOB:  1965/05/24, LOS: 1 ADMISSION DATE:  06/09/2020, CONSULTATION DATE: 06/09/2020  REFERRING MD: Dr. Tamala Julian, CHIEF COMPLAINT: Seizures   History of Present Illness:  This is a 55 yo female who presented to [facility] ER on 04/5 from home with seizure activity.  Upon review of records pt has a hx of lupus and is currently being worked up for possible lupus nephritis at [facility], and recently started CellCept 250 mg 2 capsules bid on 03/18.  There is concern pt may be close to ESRD and dialysis and/or kidney transplant.  During a previous hospitalization she was started on bumex 2 mg bid and sodium bicarb.  She was recently diagnosed with a viral URI with recommendations of supportive care and close outpatient follow-up on 03/25.  Pts daughter reported the pt has not been "acting the same" for about 2 weeks.  She also reported the pt went from a lying position to a seated position she had a seizure today that lasted for about 2-3 minutes. During the seizure the pts eyes rolled back and both her arms were shaking.  Following the seizure the pt appeared very sleepy for about 5-10 minutes.  EMS reported the pts vital signs were stable when they arrived.  ED Course Upon arrival to the ER pts daughter reported the pt appeared at her baseline, but the pt had no recollection of what happened prior to arrival in the ER.  Pt reported she has had emesis when taking her sodium bicarb tablet and loose stools over the past 24-36 hrs.  She also reported insomnia, on average only able to sleep 1-2 hrs per night over the past 2 weeks.  Lab results revealed Na+ 105, chloride 69, CO2 18, glucose 150, BUN 111, creatinine 6.24, calcium 6.9, anion gap 18, albumin 2.3, troponin 20, wbc 12.7, and hgb 8.8, platelets 440.  CT Head negative for acute intracranial findings or mass lesions.  She received 500 ml LR bolus.  ER physician contacted on call nephrologist Dr. Candiss Norse and he recommended the following:  3% saline 50 ml bolus x1 to raise Na+ above 110 with slow correction thereafter (goal of ~8 Meq correction in 24hrs).  Received 100 ml bolus of hypertonic saline per ER physician.  Pertinent  Medical History  COVID-19 dx Oct 2021 (received monoclonal antibody infusion) HTN  Stage IV CKD  Nephrotic Syndrome  Lupus  Pelvic Lymphadenopathy  Adiposity    Significant Hospital Events: Including procedures, antibiotic start and stop dates in addition to other pertinent events   . Pt admitted to ICU with hyponatremia Na 105 received 3% saline 50 ml bolus  06/10/20: Remained on 3% Hypertonic saline, latest Na 112 this afternoon (Na 105 upon presentation @ 18:00 yesterday); per discussion with Nephrology will hold 3% Hypertonic saline and follow correction with PO intake  Interim History / Subjective:  -Remained on 3% Hypertonic saline this morning, latest Na 112 this afternoon (goal correction of ~ 8 mEq in 24 hrs) -Nephrology following, discussed with Dr. Candiss Norse, will hold 3% Hypertonic Saline for now and follow correction with PO intake -Pt denies all complaints, awake, oriented to self and place -Afebrile, Hemodynamically stable -Plan to restart Cellcept for PMHx of Lupus Nephritis -Pt, husband, and daughter were updated at bedside utilizing interpreter  Objective   Blood pressure 119/67, pulse 90, temperature 98.7 F (37.1 C), resp. rate 18, height '5\' 2"'$  (1.575 m), weight 69.5 kg, SpO2 97 %.  Intake/Output Summary (Last 24 hours) at 06/10/2020 1013 Last data filed at 06/10/2020 0600 Gross per 24 hour  Intake 363.07 ml  Output 1301 ml  Net -937.93 ml   Filed Weights   06/09/20 1952 06/10/20 0000 06/10/20 0500  Weight: 65.8 kg 69.5 kg 69.5 kg    Examination: General: Acutely ill appearing female, sitting up in bed, eating and drinking, in NAD HENT: Atraumatic, normocephalic, neck supple, no JVD Lungs: Clear to auscultation bilaterally, no wheezing or rales noted, even,  nonlabored, normal effort Cardiovascular: Regular rate and rhythm, S1-S2, no murmurs, rubs, gallops, 2+ radial pulses, 1+ distal pulses, 2+ bilateral lower extremity edema  Abdomen: Soft, nontender, nondistended, no guarding or rebound tenderness, bowel sounds positive x4 Extremities: Normal bulk and tone, no deformities, moves all extremities to command Neuro: Awake and alert, oriented to self and place, speech clear, no focal deficits, right pupil 2 mm reactive, left prosthetic eye  Labs/imaging that I havepersonally reviewed  (right click and "Reselect all SmartList Selections" daily)  Lab results reviewed 06/10/2020: Na 112, Bicarb 19, BUN 104, Cr. 5.82, Ca 6.8, WBC 11.7, Hgb 7.9 CT Head 06/09/2020: negative for acute intracranial findings or mass lesions  Resolved Hospital Problem list   N/A  Assessment & Plan:   Severe hyponatremia and hypochloremia likely diuretic induced Anion gap metabolic acidosis  Chronic 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  -Continuous telemetry monitoring  -Serial Na+ monitoring q4h -Nephrology consulted appreciate input -Goal Na correction is 8 mEq in 24 hrs  -Pt's Na has corrected up to 112 (7 point correction), dicussed with Dr. Candiss Norse, will hold 3% Hypertonic saline for now and follow correction with PO intake -Urine sodium, serum osmolality, and urine osmolality results pending -Seizure precautions  -Trend BMP  -Replace electrolytes as indicated  -Monitor UOP -Avoid nephrotoxic medications  -Hold outpatient bumex   Mildly elevated troponin likely secondary to demand ischemia in the setting of severe hyponatremia  -Trend troponin (20 ~ 16)  Anemia without obvious acute blood loss  -Trend CBC  -Will resume outpatient ferrous sulfate  -Monitor for s/sx of bleeding and transfuse for hgb <7  Leukocytosis without obvious signs of infections -Trend WBC and monitor fever curve  -No indication for empiric abx at this time  -Urinalysis  not consistent with UTI -CXR without evidence of pneumonia  Lupus -Resume outpatient cellcept once able to tolerate po's  Hyperglycemia  -CBG's ac/hs -SSI  -Follow ICU Hypo/hyperglycemia protocol  Seizure activity suspected secondary to severe hyponatremia~CT Head 04/5 negative  Acute encephalopathy  -Seizure precautions  -Prn ativan for seizure activity  -Frequent reorientation   Best practice (right click and "Reselect all SmartList Selections" daily)  Diet:  Oral Pain/Anxiety/Delirium protocol (if indicated): No VAP protocol (if indicated): No DVT prophylaxis: Subcutaneous Heparin GI prophylaxis: H2B Glucose control:  SSI Yes Central venous access:  N/A Arterial line:  N/A Foley:  N/A Mobility:  bed rest  PT consulted: N/A Last date of multidisciplinary goals of care discussion [06/10/20] Code Status:  full code Disposition: ICU   Updated pt's husband and daughter at bedside 06/10/20 utilizing interpreter  Labs   CBC: Recent Labs  Lab 06/09/20 1747 06/09/20 1937 06/10/20 0454  WBC 12.7* 12.1* 11.7*  NEUTROABS 11.7*  --   --   HGB 8.8* 8.1* 7.9*  HCT 24.0* 22.3* 21.4*  MCV 74.5* 75.6* 74.8*  PLT 440* 416* [id number]    Basic Metabolic Panel: Recent Labs  Lab 06/09/20 1747 06/09/20 1937 06/09/20 2243 06/10/20  [id number] 06/10/20 0253 06/10/20 0454 06/10/20 0619 06/10/20 0855  NA 105*  --    < > 107* 106* 108* 107* 109*  K 4.4  --   --   --   --  4.1  --   --   CL 69*  --   --   --   --  72*  --   --   CO2 18*  --   --   --   --  19*  --   --   GLUCOSE 150*  --   --   --   --  98  --   --   BUN 111*  --   --   --   --  104*  --   --   CREATININE 6.24* 5.88*  --   --   --  5.82*  --   --   CALCIUM 6.9*  --   --   --   --  6.8*  --   --   MG  --   --   --   --   --  1.9  --   --   PHOS  --   --   --   --   --  10.1*  --   --    < > = values in this interval not displayed.   GFR: Estimated Creatinine Clearance: 10.2 mL/min (A) (by C-G formula based on SCr of 5.82  mg/dL (H)). Recent Labs  Lab 06/09/20 1747 06/09/20 1937 06/10/20 0454  WBC 12.7* 12.1* 11.7*  LATICACIDVEN 1.9  --   --     Liver Function Tests: Recent Labs  Lab 06/09/20 1747  AST 28  ALT 12  ALKPHOS 87  BILITOT 0.6  PROT 6.5  ALBUMIN 2.3*   No results for input(s): LIPASE, AMYLASE in the last 168 hours. No results for input(s): AMMONIA in the last 168 hours.  ABG No results found for: PHART, PCO2ART, PO2ART, HCO3, TCO2, ACIDBASEDEF, O2SAT   Coagulation Profile: No results for input(s): INR, PROTIME in the last 168 hours.  Cardiac Enzymes: No results for input(s): CKTOTAL, CKMB, CKMBINDEX, TROPONINI in the last 168 hours.  HbA1C: No results found for: HGBA1C  CBG: Recent Labs  Lab 06/10/20 0019  GLUCAP 121*    Review of Systems: Positives in BOLD  Gen: Denies fever, chills, weight change, fatigue, night sweats Pt currently denies all complaints HEENT: Denies blurred vision, double vision, hearing loss, tinnitus, sinus congestion, rhinorrhea, sore throat, neck stiffness, dysphagia PULM: Denies shortness of breath, cough, sputum production, hemoptysis, wheezing CV: Denies chest pain, edema, orthopnea, paroxysmal nocturnal dyspnea, palpitations GI: abdominal pain, nausea, vomiting, loose stools, diarrhea, hematochezia, melena, constipation, change in bowel habits GU: Denies dysuria, hematuria, polyuria, oliguria, urethral discharge Endocrine: Denies hot or cold intolerance, polyuria, polyphagia or appetite change Derm: Denies rash, dry skin, scaling or peeling skin change Heme: Denies easy bruising, bleeding, bleeding gums Neuro: seizures, headache, numbness, weakness, slurred speech, loss of memory or consciousness  Past Medical History:  She,  has a past medical history of Kidney disease, Lupus ([place]), and Prosthetic eye globe.   Surgical History:  History reviewed. No pertinent surgical history.   Social History:   reports that she has never smoked. She  has never used smokeless tobacco. She reports previous alcohol use. She reports that she does not use drugs.   Family History:  Her family history is not on file.   Allergies No Known Allergies  Home Medications  Prior to Admission medications   Not on File     Critical care time: 40 minutes    Darel Hong, [facility] [place] Pager: [phone]

## 2020-06-10 NOTE — Progress Notes (Signed)
Pts Na+ 106 despite continuous NS '@50'$  ml/hr.  Therefore, will administer 100 ml bolus of 3% hypertonic saline x1 dose and continue serial Na+ rechecks q2hrs for now.  Marda Stalker, [place] Pager [phone] (please enter 7 digits) PCCM Consult Pager [phone] (please enter 7 digits)

## 2020-06-10 NOTE — Progress Notes (Signed)
Hinton Dyer NP aware of critical NA+ 106.

## 2020-06-10 NOTE — Progress Notes (Signed)
Bladder scanned patient 296 ml. Patient offered bedpan, 250 ml output.

## 2020-06-10 NOTE — Progress Notes (Signed)
Patient confused throughout the shift. She is on room air, no complaints of shortness of breath or pain. Patient placed on renal diet with 1200 fluid restriction per Dr. Candiss Norse. Dietician consult placed. Bladder scanned patient 310. Will re-scan at 18:00. Per Dr.Singh ordered to stop 3% saline. Sodium checks q4. Continue to assess.

## 2020-06-10 NOTE — Progress Notes (Signed)
Hinton Dyer NP aware of critical NA+ of 108.

## 2020-06-10 NOTE — Progress Notes (Signed)
Per Carol Hicks and Dr Candiss Norse stopped 3% saline. Patient eating and drinking. Will evaluate again during next sodium check. Continue to assess.

## 2020-06-10 NOTE — Plan of Care (Signed)
Patient will remain seizure free and Na will normalize.

## 2020-06-10 NOTE — Progress Notes (Signed)
Bladder scan 310. Per Jearld Adjutant. Check again at 18:00. In and out cath once bladder scan shows over 400 ml.

## 2020-06-11 ENCOUNTER — Inpatient Hospital Stay: Payer: BC Managed Care – PPO

## 2020-06-11 DIAGNOSIS — R569 Unspecified convulsions: Secondary | ICD-10-CM

## 2020-06-11 DIAGNOSIS — E871 Hypo-osmolality and hyponatremia: Secondary | ICD-10-CM | POA: Diagnosis not present

## 2020-06-11 LAB — GLUCOSE, CAPILLARY
Glucose-Capillary: 90 mg/dL (ref 70–99)
Glucose-Capillary: 100 mg/dL — ABNORMAL HIGH (ref 70–99)

## 2020-06-11 LAB — CBC
HCT: 20.8 % — ABNORMAL LOW (ref 36.0–46.0)
Hemoglobin: 7.6 g/dL — ABNORMAL LOW (ref 12.0–15.0)
MCH: 27.6 pg (ref 26.0–34.0)
MCHC: 36.5 g/dL — ABNORMAL HIGH (ref 30.0–36.0)
MCV: 75.6 fL — ABNORMAL LOW (ref 80.0–100.0)
Platelets: 321 10*3/uL (ref 150–400)
RBC: 2.75 MIL/uL — ABNORMAL LOW (ref 3.87–5.11)
RDW: 11.8 % (ref 11.5–15.5)
WBC: 22.9 10*3/uL — ABNORMAL HIGH (ref 4.0–10.5)
nRBC: 0 % (ref 0.0–0.2)

## 2020-06-11 LAB — RENAL FUNCTION PANEL
Albumin: 2 g/dL — ABNORMAL LOW (ref 3.5–5.0)
Anion gap: 16 — ABNORMAL HIGH (ref 5–15)
BUN: 117 mg/dL — ABNORMAL HIGH (ref 6–20)
CO2: 18 mmol/L — ABNORMAL LOW (ref 22–32)
Calcium: 6.6 mg/dL — ABNORMAL LOW (ref 8.9–10.3)
Chloride: 75 mmol/L — ABNORMAL LOW (ref 98–111)
Creatinine, Ser: 6.23 mg/dL — ABNORMAL HIGH (ref 0.44–1.00)
GFR, Estimated: 7 mL/min — ABNORMAL LOW (ref >60)
Glucose, Bld: 100 mg/dL — ABNORMAL HIGH (ref 70–99)
Phosphorus: 10 mg/dL — ABNORMAL HIGH (ref 2.5–4.6)
Potassium: 4 mmol/L (ref 3.5–5.1)
Sodium: 109 mmol/L — CL (ref 135–145)

## 2020-06-11 LAB — HEPATIC FUNCTION PANEL
ALT: 14 U/L (ref 0–44)
AST: 24 U/L (ref 15–41)
Albumin: 2.1 g/dL — ABNORMAL LOW (ref 3.5–5.0)
Alkaline Phosphatase: 82 U/L (ref 38–126)
Bilirubin, Direct: <0.1 mg/dL (ref 0.0–0.2)
Total Bilirubin: 0.6 mg/dL (ref 0.3–1.2)
Total Protein: 5.7 g/dL — ABNORMAL LOW (ref 6.5–8.1)

## 2020-06-11 LAB — SODIUM
Sodium: 110 mmol/L — CL (ref 135–145)
Sodium: 111 mmol/L — CL (ref 135–145)
Sodium: 111 mmol/L — CL (ref 135–145)
Sodium: 111 mmol/L — CL (ref 135–145)
Sodium: 112 mmol/L — CL (ref 135–145)
Sodium: 114 mmol/L — CL (ref 135–145)

## 2020-06-11 LAB — CBC WITH DIFFERENTIAL/PLATELET
Abs Immature Granulocytes: 0.1 10*3/uL — ABNORMAL HIGH (ref 0.00–0.07)
Basophils Absolute: 0 10*3/uL (ref 0.0–0.1)
Basophils Relative: 0 %
Eosinophils Absolute: 0 10*3/uL (ref 0.0–0.5)
Eosinophils Relative: 0 %
HCT: 21.3 % — ABNORMAL LOW (ref 36.0–46.0)
Hemoglobin: 7.8 g/dL — ABNORMAL LOW (ref 12.0–15.0)
Immature Granulocytes: 1 %
Lymphocytes Relative: 4 %
Lymphs Abs: 0.8 10*3/uL (ref 0.7–4.0)
MCH: 27.7 pg (ref 26.0–34.0)
MCHC: 36.6 g/dL — ABNORMAL HIGH (ref 30.0–36.0)
MCV: 75.5 fL — ABNORMAL LOW (ref 80.0–100.0)
Monocytes Absolute: 0.4 10*3/uL (ref 0.1–1.0)
Monocytes Relative: 2 %
Neutro Abs: 17.7 10*3/uL — ABNORMAL HIGH (ref 1.7–7.7)
Neutrophils Relative %: 93 %
Platelets: 365 10*3/uL (ref 150–400)
RBC: 2.82 MIL/uL — ABNORMAL LOW (ref 3.87–5.11)
RDW: 11.7 % (ref 11.5–15.5)
WBC: 19.1 10*3/uL — ABNORMAL HIGH (ref 4.0–10.5)
nRBC: 0 % (ref 0.0–0.2)

## 2020-06-11 LAB — MAGNESIUM: Magnesium: 2 mg/dL (ref 1.7–2.4)

## 2020-06-11 LAB — PROCALCITONIN: Procalcitonin: 4 ng/mL

## 2020-06-11 MED ORDER — SODIUM CHLORIDE 0.9 % IV SOLN
1.0000 g | INTRAVENOUS | Status: AC
  Administered 2020-06-11 – 2020-06-15 (×5): 1 g via INTRAVENOUS
  Filled 2020-06-11 (×5): qty 1

## 2020-06-11 MED ORDER — SODIUM CHLORIDE 0.9 % IV SOLN
500.0000 mg | INTRAVENOUS | Status: AC
  Administered 2020-06-11 – 2020-06-15 (×5): 500 mg via INTRAVENOUS
  Filled 2020-06-11 (×5): qty 500

## 2020-06-11 MED ORDER — SODIUM CHLORIDE 3 % IV SOLN
INTRAVENOUS | Status: DC
  Filled 2020-06-11: qty 500

## 2020-06-11 MED ORDER — SEVELAMER CARBONATE 800 MG PO TABS
800.0000 mg | ORAL_TABLET | Freq: Three times a day (TID) | ORAL | Status: DC
  Administered 2020-06-11 – 2020-06-22 (×25): 800 mg via ORAL
  Filled 2020-06-11 (×25): qty 1

## 2020-06-11 MED ORDER — SODIUM CHLORIDE 3 % IV BOLUS
50.0000 mL | Freq: Once | INTRAVENOUS | Status: AC
  Administered 2020-06-11: 50 mL via INTRAVENOUS
  Filled 2020-06-11: qty 500

## 2020-06-11 MED ORDER — SODIUM CHLORIDE 3 % IV SOLN
INTRAVENOUS | Status: DC
  Filled 2020-06-11 (×7): qty 500

## 2020-06-11 NOTE — Progress Notes (Signed)
PHARMACY CONSULT NOTE - FOLLOW UP  Pharmacy Consult for Electrolyte Monitoring and Replacement   Recent Labs: Potassium (mmol/L)  Date Value  06/11/2020 4.0   Magnesium (mg/dL)  Date Value  06/11/2020 2.0   Calcium (mg/dL)  Date Value  06/11/2020 6.6 (L)   Albumin (g/dL)  Date Value  06/11/2020 2.1 (L)   Phosphorus (mg/dL)  Date Value  06/11/2020 10.0 (H)   Sodium (mmol/L)  Date Value  06/11/2020 111 (LL)   Corrected Ca: 8.2  Assessment: 56 yo F presented with c/o seizure. Pt Na was found to be 105. Pharmacy has been consulted to monitor Na levels while on 3% NaCl infusion.   Goal of Therapy:  --Increase in sodium of 8 mEq in 24 hr period --If Na increases > 4 mEq/L in 2 hours or > 6 mEq/L in 4 hours call RN to stop infusion and notify MD   Date Time Na level 4/5 1747 105 4/5 2040 3% NaCl bolus given 4/5 2243 107 4/6 0032 107 4/6 0253 106 4/6 0445 3% NaCl bolus given  4/6 0454 108 4/6 0619 107 4/6 0745 3% NaCl continuous infusion started @ 0745 4/6 0855 109 4/6 1251 112 4/6 1939 111 4/6 2227 109 4/7 0257 110 4/7 0639 111 4/7 1054 111 4/7 - 3% NaCl continuous infusion was stopped sometime last night... restarted @ 1230 @ 20 mL/hr 4/7  1531 111  Plan:  --Na 111 - Continue 3% NaCl continuous infusion @ 20 mL/hr --Monitor sodium levels every 4 hours to ensure level does not increase by more than > 4 mEq/L in 2 hours or > 6 mEq/L in 4 hours --No additional replacement needed at this time --Monitor electrolyte levels with AM labs  Dorothe Pea, PharmD 06/11/2020 4:41 PM

## 2020-06-11 NOTE — Progress Notes (Signed)
Checked in with patient and family together today. They have requested a priest to come for " prayer for the sick" I have contacted Father Evette Doffing, Doctor, hospital). Will follow up.

## 2020-06-11 NOTE — Progress Notes (Signed)
PHARMACY CONSULT NOTE - FOLLOW UP  Pharmacy Consult for Electrolyte Monitoring and Replacement   Recent Labs: Potassium (mmol/L)  Date Value  06/11/2020 4.0   Magnesium (mg/dL)  Date Value  06/11/2020 2.0   Calcium (mg/dL)  Date Value  06/11/2020 6.6 (L)   Albumin (g/dL)  Date Value  06/11/2020 2.0 (L)   Phosphorus (mg/dL)  Date Value  06/11/2020 10.0 (H)   Sodium (mmol/L)  Date Value  06/11/2020 111 (LL)   Corrected Ca: 8.2  Assessment: 55 yo F presented with c/o seizure. Pt Na was found to be 105. Pharmacy has been consulted to monitor Na levels while on 3% NaCl infusion.   Goal of Therapy:  --Increase in sodium of 8 mEq in 24 hr period --If Na increases > 4 mEq/L in 2 hours or > 6 mEq/L in 4 hours call RN to stop infusion and notify MD   Date Time Na level 4/5 1747 105 4/5 2040 3% NaCl bolus given 4/5 2243 107 4/6 0032 107 4/6 0253 106 4/6 0445 3% NaCl bolus given  4/6 0454 108 4/6 0619 107 4/6 0745 3% NaCl continuous infusion started @ 0745 4/6 0855 109 4/6 1251 112 4/6 1939 111 4/6 2227 109 4/7 0257 110 4/7 0639 111 4/7 1054 111 4/7 - 3% NaCl continuous infusion was stopped sometime last night... restarted @ 1230 @ 20 mL/hr  Plan:  --Na 111 - Continue 3% NaCl continuous infusion @ 20 mL/hr --Monitor sodium levels every 4 hours to ensure level does not increase by more than > 4 mEq/L in 2 hours or > 6 mEq/L in 4 hours --No additional replacement needed at this time --Monitor electrolyte levels with AM labs  Benn Moulder, PharmD Pharmacy Resident  06/11/2020 7:31 AM

## 2020-06-11 NOTE — Progress Notes (Addendum)
NAME:  Carol Hicks, MRN:  [id number], DOB:  1966/02/19, LOS: 2 ADMISSION DATE:  06/09/2020, CONSULTATION DATE: 06/09/2020  REFERRING MD: Dr. Tamala Julian, CHIEF COMPLAINT: Seizures   History of Present Illness:  This is a 55 yo female who presented to [facility] ER on 04/5 from home with seizure activity.  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  There is concern pt may be close to ESRD and dialysis and/or kidney transplant.  During a previous hospitalization she was started on bumex 2 mg bid and sodium bicarb.  She was recently diagnosed with a viral URI with recommendations of supportive care and close outpatient follow-up on 03/25.  Pts daughter reported the pt has not been "acting the same" for about 2 weeks.  She also reported the pt went from a lying position to a seated position she had a seizure today that lasted for about 2-3 minutes. During the seizure the pts eyes rolled back and both her arms were shaking.  Following the seizure the pt appeared very sleepy for about 5-10 minutes.  EMS reported the pts vital signs were stable when they arrived.  ED Course Upon arrival to the ER pts daughter reported the pt appeared at her baseline, but the pt had no recollection of what happened prior to arrival in the ER.  Pt reported she has had emesis when taking her sodium bicarb tablet and loose stools over the past 24-36 hrs.  She also reported insomnia, on average only able to sleep 1-2 hrs per night over the past 2 weeks.  Lab results revealed Na+ 105, chloride 69, CO2 18, glucose 150, BUN 111, creatinine 6.24, calcium 6.9, anion gap 18, albumin 2.3, troponin 20, wbc 12.7, and hgb 8.8, platelets 440.  CT Head negative for acute intracranial findings or mass lesions.  She received 500 ml LR bolus.  ER physician contacted on call nephrologist Dr. Candiss Norse and he recommended the following:  3% saline 50 ml bolus x1 to raise Na+ above 110 with slow correction thereafter (goal of ~8 Meq correction in 24hrs).  Received 100 ml bolus of hypertonic saline per ER physician.  Pertinent  Medical History  COVID-19 dx Oct 2021 (received monoclonal antibody infusion) HTN  Stage IV CKD  Nephrotic Syndrome  Lupus  Pelvic Lymphadenopathy  Adiposity    Cultures:  06/11/20: Blood culture x2>> 06/11/20: Sputum>> 06/11/20: Strep pneumo urinary antigen>> 06/11/20: Legionella urinary antigen>> 06/11/20: Mycoplasma pneumoniae >> 06/11/20: Pneumocystis PCR>> 06/11/20: Urine>>  Antimicrobials:  Azithromycin 4/7>> Ceftriaxone 4/7>>  Significant Hospital Events: Including procedures, antibiotic start and stop dates in addition to other pertinent events   . Pt admitted to ICU with hyponatremia Na 105 received 3% saline 50 ml bolus  06/10/20: Remained on 3% Hypertonic saline, latest Na 112 this afternoon (Na 105 upon presentation @ 18:00 yesterday); per discussion with Nephrology will hold 3% Hypertonic saline and follow correction with PO intake 06/11/20: Serum Na remains low, placed on NS overnight. Placing back on 3% Saline by Nephrology, increasing WBC 22.9, CXR with left basilar opacity concerning for atelectasis vs. PNA, placed on empiric Azithromycin + Rocephin, cultures obtained  Interim History / Subjective:  -Serum Na remained low overnight (Na 109), was placed on NS  -Pt placed back on 3% Hypertonic saline this morning by Nephrology -Discussed with Dr. Candiss Norse, goal Na correction over next 24  hrs is 8-10 mEq (approx. 120) -Pt noted to have increasing Leukocytosis 22.9 (previously 11), T max overnight 100F; UA not concerning for UTI, CXR obtained and shows Left basilar infiltrate concerning for Atelectasis vs Pneumonia -Workup for PNA, place on empiric Azithromycin + Rocepin -On room air, hemodynamically stable. -Denies all complaints (no chest pain, SOB, cough, fever, chills, HA, dizziness) -Ate  breakfast this morning -Pt and husband interviewed/updated utilizing interpreter  Objective   Blood pressure 129/66, pulse 95, temperature 99.1 F (37.3 C), temperature source Oral, resp. rate 18, height '5\' 2"'$  (1.575 m), weight 73.2 kg, SpO2 98 %.        Intake/Output Summary (Last 24 hours) at 06/11/2020 0859 Last data filed at 06/11/2020 [id number] Gross per 24 hour  Intake 618.8 ml  Output 750 ml  Net -131.2 ml   Filed Weights   06/10/20 0000 06/10/20 0500 06/11/20 0500  Weight: 69.5 kg 69.5 kg 73.2 kg    Examination: General: Acutely ill-appearing female, sitting in bed, on room air, no acute distress HENT: Atraumatic, normal, neck supple, no JVD Lungs: Clear to auscultation bilaterally, no wheezing or rales noted, even, nonlabored, normal effort Cardiovascular: Regular rate and rhythm, S1-S2, no murmurs, rubs, gallops, 2+ radial pulses, 1+ distal pulses   Abdomen: Soft, nontender, nondistended, no guarding or rebound tenderness, bowel sounds positive x4 Extremities: Normal bulk and tone, no deformities, no edema Neuro: Awake and alert, oriented to self, place, time, follows commands, no focal deficits, speech clear, right pupil 2 mm reactive, left prosthetic eye  Labs/imaging that I havepersonally reviewed  (right click and "Reselect all SmartList Selections" daily)  Lab results reviewed 04/672022: Na 112, Bicarb 19, BUN 104, Cr. 5.82, Ca 6.8, WBC 11.7, Hgb 7.9 CXR 06/11/20>>Nonspecific left basilar airspace opacity obscures the left hemidiaphragm. Differential considerations include atelectasis, layering pleural effusion and/or infiltrate. 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 Candiss Norse, goal Na correction over next 24 hrs is 8-10 mEq (approx. 120 by tomorrow); placed back on 3% Hypertonic Saline @ 20 ml/hr -Seizure precautions  -Trend BMP  -Replace electrolytes as indicated  -Monitor UOP -Avoid nephrotoxic medications  -Hold outpatient bumex   Mildly elevated troponin likely secondary to demand ischemia in the setting of severe hyponatremia  -Trend troponin (20 ~ 16)  Anemia without obvious acute blood loss  -Trend CBC  -Will resume outpatient ferrous sulfate  -Monitor for s/sx of bleeding and transfuse for hgb <7  Leukocytosis Concern for Left Basilar Pneumonia on CXR 06/11/20 -Monitor fever curve  -Trend WBC's -Check Procalcitonin -Urinalysis not consistent with UTI -Obtain CXR>> shows nonspecific Left basilar opacity concerning for atelectasis vs. Infiltrate -Will obtain sputum culture, blood culture, Strep Pneumo and Legionella urinary antigens, Mycoplasma Pneumoniae, and Pneumocystis PCR -Start empiric Azithromycin + Rocephin pending cultures and sensitivities -Abdominal exam is benign  Lupus -Resume outpatient cellcept   Hyperglycemia  -CBG's ac/hs -SSI  -Follow ICU Hypo/hyperglycemia protocol  Seizure activity suspected secondary to severe hyponatremia~CT Head 04/5 negative  Acute encephalopathy  -Seizure precautions  -Prn ativan for seizure activity  -Frequent reorientation   Best practice (right click and "Reselect all SmartList Selections" daily)  Diet:  Oral Pain/Anxiety/Delirium protocol (if indicated):  No VAP protocol (if indicated): No DVT prophylaxis: Subcutaneous Heparin GI prophylaxis: H2B Glucose control:  SSI Yes Central venous access:  N/A Arterial line:  N/A Foley:  N/A Mobility:  bed rest  PT consulted: N/A Last date of multidisciplinary goals of care discussion [06/11/20] Code Status:  full code Disposition: ICU   Updated pt's husband at  bedside 06/11/20 utilizing interpreter  Labs   CBC: Recent Labs  Lab 06/09/20 1747 06/09/20 1937 06/10/20 0454 06/11/20 0257  WBC 12.7* 12.1* 11.7* 22.9*  NEUTROABS 11.7*  --   --   --   HGB 8.8* 8.1* 7.9* 7.6*  HCT 24.0* 22.3* 21.4* 20.8*  MCV 74.5* 75.6* 74.8* 75.6*  PLT 440* 416* 377 [id number]    Basic Metabolic Panel: Recent Labs  Lab 06/09/20 1747 06/09/20 1937 06/09/20 2243 06/10/20 0454 06/10/20 0619 06/10/20 1251 06/10/20 1939 06/10/20 2227 06/11/20 0257 06/11/20 0639  NA 105*  --    < > 108*   < > 112* 111* 109* 109*  110* 111*  K 4.4  --   --  4.1  --   --   --   --  4.0  --   CL 69*  --   --  72*  --   --   --   --  75*  --   CO2 18*  --   --  19*  --   --   --   --  18*  --   GLUCOSE 150*  --   --  98  --   --   --   --  100*  --   BUN 111*  --   --  104*  --   --   --   --  117*  --   CREATININE 6.24* 5.88*  --  5.82*  --   --   --   --  6.23*  --   CALCIUM 6.9*  --   --  6.8*  --   --   --   --  6.6*  --   MG  --   --   --  1.9  --   --   --   --  2.0  --   PHOS  --   --   --  10.1*  --   --   --   --  10.0*  --    < > = values in this interval not displayed.   GFR: Estimated Creatinine Clearance: 9.8 mL/min (A) (by C-G formula based on SCr of 6.23 mg/dL (H)). Recent Labs  Lab 06/09/20 1747 06/09/20 1937 06/10/20 0454 06/11/20 0257  WBC 12.7* 12.1* 11.7* 22.9*  LATICACIDVEN 1.9  --   --   --     Liver Function Tests: Recent Labs  Lab 06/09/20 1747 06/11/20 0257  AST 28  --   ALT 12  --   ALKPHOS 87  --   BILITOT 0.6  --   PROT 6.5  --   ALBUMIN 2.3* 2.0*   No results for input(s): LIPASE, AMYLASE in the last 168 hours. No results for input(s): AMMONIA in the last 168 hours.  ABG No results found for: PHART, PCO2ART, PO2ART, HCO3, TCO2, ACIDBASEDEF, O2SAT   Coagulation Profile: No results for input(s): INR, PROTIME in the last 168 hours.  Cardiac Enzymes: No results for input(s): CKTOTAL, CKMB, CKMBINDEX, TROPONINI in the last 168  hours.  HbA1C: No results found for: HGBA1C  CBG: Recent Labs  Lab 06/10/20 0019 06/11/20 0718  GLUCAP 121* 90    Review of Systems: Positives in BOLD  Gen: Denies fever, chills, weight change, fatigue, night sweats Pt currently denies all complaints HEENT: Denies blurred vision, double vision, hearing loss, tinnitus, sinus congestion, rhinorrhea, sore throat, neck stiffness, dysphagia PULM: Denies shortness of breath, cough, sputum production, hemoptysis, wheezing CV: Denies chest pain, edema, orthopnea, paroxysmal nocturnal dyspnea, palpitations GI: abdominal pain, nausea, vomiting, loose stools, diarrhea, hematochezia, melena, constipation, change in bowel habits GU: Denies dysuria, hematuria, polyuria, oliguria, urethral discharge Endocrine: Denies hot or cold intolerance, polyuria, polyphagia or appetite change Derm: Denies rash, dry skin, scaling or peeling skin change Heme: Denies easy bruising, bleeding, bleeding gums Neuro: seizures, headache, numbness, weakness, slurred speech, loss of memory or consciousness  Past Medical History:  She,  has a past medical history of Kidney disease, Lupus ([place]), and Prosthetic eye globe.   Surgical History:  History reviewed. No pertinent surgical history.   Social History:   reports that she has never smoked. She has never used smokeless tobacco. She reports previous alcohol use. She reports that she does not use drugs.   Family History:  Her family history is not on file.   Allergies No Known Allergies   Home Medications  Prior to Admission medications   Not on File     Critical care time: 35 minutes    Darel Hong, [facility] Pager: [phone]

## 2020-06-11 NOTE — Progress Notes (Signed)
[place], [place] 06/11/20  Subjective:   Hospital day # 2  Overall patient is feeling better.  Was able to eat breakfast lunch and dinner yesterday. This morning she denies any shortness of breath No leg edema  Serum sodium staying low.  Overnight team started normal saline which did not help improve serum sodium 04/06 0701 - 04/07 0700 In: 618.8 [I.V.:551.2; IV Piggyback:67.6] Out: 750 [Urine:750] Lab Results  Component Value Date   CREATININE 6.23 (H) 06/11/2020   CREATININE 5.82 (H) 06/10/2020   CREATININE 5.88 (H) 06/09/2020     Objective:  Vital signs in last 24 hours:  Temp:  [97.9 F (36.6 C)-100 F (37.8 C)] 99.1 F (37.3 C) (04/07 0400) Pulse Rate:  [85-96] 95 (04/07 0700) Resp:  [17-26] 18 (04/07 0700) BP: (113-138)/(60-76) 129/66 (04/07 0700) SpO2:  [96 %-100 %] 98 % (04/07 0700) Weight:  [73.2 kg] 73.2 kg (04/07 0500)  Weight change: 7.429 kg Filed Weights   06/10/20 0000 06/10/20 0500 06/11/20 0500  Weight: 69.5 kg 69.5 kg 73.2 kg    Intake/Output:    Intake/Output Summary (Last 24 hours) at 06/11/2020 0855 Last data filed at 06/11/2020 [id number] Gross per 24 hour  Intake 618.8 ml  Output 750 ml  Net -131.2 ml    Physical Exam: General:  No acute distress, laying in the bed  HEENT  anicteric, moist oral mucous membrane  Pulm/lungs  normal breathing effort, lungs are clear to auscultation  CVS/Heart  regular rhythm, no rub or gallop  Abdomen:   Soft, nontender  Extremities:  No peripheral edema  Neurologic:  Alert, oriented, able to follow commands  Skin:  No acute rashes    Basic Metabolic Panel:  Recent Labs  Lab 06/09/20 1747 06/09/20 1937 06/09/20 2243 06/10/20 0454 06/10/20 0619 06/10/20 1251 06/10/20 1939 06/10/20 2227 06/11/20 0257 06/11/20 0639  NA 105*  --    < > 108*   < > 112* 111* 109* 109*  110* 111*  K 4.4  --   --  4.1  --   --   --   --  4.0  --   CL 69*  --   --  72*  --   --   --   --   75*  --   CO2 18*  --   --  19*  --   --   --   --  18*  --   GLUCOSE 150*  --   --  98  --   --   --   --  100*  --   BUN 111*  --   --  104*  --   --   --   --  117*  --   CREATININE 6.24* 5.88*  --  5.82*  --   --   --   --  6.23*  --   CALCIUM 6.9*  --   --  6.8*  --   --   --   --  6.6*  --   MG  --   --   --  1.9  --   --   --   --  2.0  --   PHOS  --   --   --  10.1*  --   --   --   --  10.0*  --    < > = values in this interval not displayed.     CBC: Recent Labs  Lab 06/09/20  1747 06/09/20 1937 06/10/20 0454 06/11/20 0257  WBC 12.7* 12.1* 11.7* 22.9*  NEUTROABS 11.7*  --   --   --   HGB 8.8* 8.1* 7.9* 7.6*  HCT 24.0* 22.3* 21.4* 20.8*  MCV 74.5* 75.6* 74.8* 75.6*  PLT 440* 416* 377 321     No results found for: HEPBSAG, HEPBSAB, HEPBIGM    Microbiology:  Recent Results (from the past 240 hour(s))  Resp Panel by RT-PCR (Flu A&B, Covid) Nasopharyngeal Swab     Status: None   Collection Time: 06/09/20  7:37 PM   Specimen: Nasopharyngeal Swab; Nasopharyngeal(NP) swabs in vial transport medium  Result Value Ref Range Status   SARS Coronavirus 2 by RT PCR NEGATIVE NEGATIVE Final    Comment: (NOTE) SARS-CoV-2 target nucleic acids are NOT DETECTED.  The SARS-CoV-2 RNA is generally detectable in upper respiratory specimens during the acute phase of infection. The lowest concentration of SARS-CoV-2 viral copies this assay can detect is 138 copies/mL. A negative result does not preclude SARS-Cov-2 infection and should not be used as the sole basis for treatment or other patient management decisions. A negative result may occur with  improper specimen collection/handling, submission of specimen other than nasopharyngeal swab, presence of viral mutation(s) within the areas targeted by this assay, and inadequate number of viral copies(<138 copies/mL). A negative result must be combined with clinical observations, patient history, and epidemiological information. The  expected result is Negative.  Fact Sheet for Patients:  EntrepreneurPulse.com.au  Fact Sheet for Healthcare Providers:  IncredibleEmployment.be  This test is no t yet approved or cleared by the [place] FDA and  has been authorized for detection and/or diagnosis of SARS-CoV-2 by FDA under an Emergency Use Authorization (EUA). This EUA will remain  in effect (meaning this test can be used) for the duration of the COVID-19 declaration under Section 564(b)(1) of the Act, 21 U.S.C.section 360bbb-3(b)(1), unless the authorization is terminated  or revoked sooner.       Influenza A by PCR NEGATIVE NEGATIVE Final   Influenza B by PCR NEGATIVE NEGATIVE Final    Comment: (NOTE) The Xpert Xpress SARS-CoV-2/FLU/RSV plus assay is intended as an aid in the diagnosis of influenza from Nasopharyngeal swab specimens and should not be used as a sole basis for treatment. Nasal washings and aspirates are unacceptable for Xpert Xpress SARS-CoV-2/FLU/RSV testing.  Fact Sheet for Patients: EntrepreneurPulse.com.au  Fact Sheet for Healthcare Providers: IncredibleEmployment.be  This test is not yet approved or cleared by the [place] FDA and has been authorized for detection and/or diagnosis of SARS-CoV-2 by FDA under an Emergency Use Authorization (EUA). This EUA will remain in effect (meaning this test can be used) for the duration of the COVID-19 declaration under Section 564(b)(1) of the Act, 21 U.S.C. section 360bbb-3(b)(1), unless the authorization is terminated or revoked.  Performed at [facility], [address] [place], [place] [address]   MRSA PCR Screening     Status: None   Collection Time: 06/10/20 12:24 AM   Specimen: Nasopharyngeal  Result Value Ref Range Status   MRSA by PCR NEGATIVE NEGATIVE Final    Comment:        The GeneXpert MRSA Assay (FDA approved for NASAL specimens only),  is one component of a comprehensive MRSA colonization surveillance program. It is not intended to diagnose MRSA infection nor to guide or monitor treatment for MRSA infections. Performed at [facility], [address] [place], [place] [address]     Coagulation Studies: No  results for input(s): LABPROT, INR in the last 72 hours.  Urinalysis: Recent Labs    06/10/20 0606  COLORURINE YELLOW*  LABSPEC 1.011  PHURINE 5.0  GLUCOSEU 50*  HGBUR MODERATE*  BILIRUBINUR NEGATIVE  KETONESUR NEGATIVE  PROTEINUR >=300*  NITRITE NEGATIVE  LEUKOCYTESUR NEGATIVE      Imaging: CT Head Wo Contrast  Result Date: 06/09/2020 CLINICAL DATA:  Mental status change. EXAM: CT HEAD WITHOUT CONTRAST TECHNIQUE: Contiguous axial images were obtained from the base of the skull through the vertex without intravenous contrast. COMPARISON:  None. FINDINGS: Brain: The ventricles are in the midline without mass effect or shift. No extra-axial fluid collections are identified. The gray-white differentiation is maintained. No findings for acute intracranial process such as hemorrhage or infarction. No mass lesions. The brainstem and cerebellum are unremarkable. Are few tiny scattered parenchymal calcifications which could be due to remote inflammation or infection. Vascular: Scattered age advanced vascular calcifications. No aneurysm or hyperdense vessels. Skull: No skull fracture or bone lesions. Sinuses/Orbits: Chronic appearing right-sided maxillary sinusitis with a thickened sinus wall. There is also mild right-sided ethmoid sinus disease. 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 Other: No scalp lesions or scalp hematoma. IMPRESSION: 1. No acute intracranial findings or mass lesions. 2. Chronic appearing right-sided maxillary sinusitis. Electronically Signed   By: Marijo Sanes M.D.   On: 06/09/2020 18:40     Medications:   . sodium chloride  (hypertonic)     . Chlorhexidine Gluconate Cloth  6 each Topical Q0600  . heparin  5,000 Units Subcutaneous Q8H  . mycophenolate  500 mg Oral BID   acetaminophen, docusate sodium, LORazepam, ondansetron (ZOFRAN) IV, polyethylene glycol  Assessment/ Plan:  55 y.o. 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 EM with membranous pattern and extraglomerular immune deposits as well as some inflammation and fibrosis in the interstitium by EM. Favoring autoimmune process."  Was on Cell cept, plaquenil and prednisone as outpatient  -will restart cellcept and monitor No uremic symptoms Electrolytes and Volume status are acceptable No acute indication for Dialysis at present   #Hypertension  currently controlled Was on amlodipine, losartan and bumetanide at home  #Hyperphosphatemia likely due to secondary hyperparathyroidism We will restart sevelamer with meals   LOS: [place] 4/7/20228:55 AM  [place], [place]  Note: This note was prepared with Dragon dictation. Any transcription errors are unintentional

## 2020-06-12 DIAGNOSIS — E871 Hypo-osmolality and hyponatremia: Secondary | ICD-10-CM | POA: Diagnosis not present

## 2020-06-12 DIAGNOSIS — N179 Acute kidney failure, unspecified: Secondary | ICD-10-CM | POA: Diagnosis not present

## 2020-06-12 LAB — RENAL FUNCTION PANEL
Albumin: 2 g/dL — ABNORMAL LOW (ref 3.5–5.0)
Anion gap: 17 — ABNORMAL HIGH (ref 5–15)
BUN: 122 mg/dL — ABNORMAL HIGH (ref 6–20)
CO2: 17 mmol/L — ABNORMAL LOW (ref 22–32)
Calcium: 6.3 mg/dL — CL (ref 8.9–10.3)
Chloride: 80 mmol/L — ABNORMAL LOW (ref 98–111)
Creatinine, Ser: 5.94 mg/dL — ABNORMAL HIGH (ref 0.44–1.00)
GFR, Estimated: 8 mL/min — ABNORMAL LOW (ref >60)
Glucose, Bld: 96 mg/dL (ref 70–99)
Phosphorus: 9.9 mg/dL — ABNORMAL HIGH (ref 2.5–4.6)
Potassium: 3.9 mmol/L (ref 3.5–5.1)
Sodium: 114 mmol/L — CL (ref 135–145)

## 2020-06-12 LAB — CBC
HCT: 20 % — ABNORMAL LOW (ref 36.0–46.0)
Hemoglobin: 7.1 g/dL — ABNORMAL LOW (ref 12.0–15.0)
MCH: 27.5 pg (ref 26.0–34.0)
MCHC: 35.5 g/dL (ref 30.0–36.0)
MCV: 77.5 fL — ABNORMAL LOW (ref 80.0–100.0)
Platelets: 301 10*3/uL (ref 150–400)
RBC: 2.58 MIL/uL — ABNORMAL LOW (ref 3.87–5.11)
RDW: 11.8 % (ref 11.5–15.5)
WBC: 17.1 10*3/uL — ABNORMAL HIGH (ref 4.0–10.5)
nRBC: 0 % (ref 0.0–0.2)

## 2020-06-12 LAB — SODIUM
Sodium: 115 mmol/L — CL (ref 135–145)
Sodium: 117 mmol/L — CL (ref 135–145)
Sodium: 118 mmol/L — CL (ref 135–145)
Sodium: 123 mmol/L — ABNORMAL LOW (ref 135–145)
Sodium: 122 mmol/L — ABNORMAL LOW (ref 135–145)

## 2020-06-12 LAB — PROCALCITONIN: Procalcitonin: 3.7 ng/mL

## 2020-06-12 LAB — STREP PNEUMONIAE URINARY ANTIGEN: Strep Pneumo Urinary Antigen: NEGATIVE

## 2020-06-12 LAB — URINE CULTURE: Culture: NO GROWTH

## 2020-06-12 LAB — MAGNESIUM: Magnesium: 1.9 mg/dL (ref 1.7–2.4)

## 2020-06-12 MED ORDER — DEXMEDETOMIDINE HCL IN NACL 400 MCG/100ML IV SOLN: 0.4000 ug/kg/h | INTRAVENOUS | Status: DC

## 2020-06-12 NOTE — Progress Notes (Signed)
NAME:  Carol Hicks, MRN:  [id number], DOB:  29-Sep-1965, LOS: 3 ADMISSION DATE:  06/09/2020, CONSULTATION DATE: 06/09/2020  REFERRING MD: Dr. Tamala Julian, CHIEF COMPLAINT: Seizures   History of Present Illness:  This is a 55 yo female who presented to [facility] ER on 04/5 from home with seizure activity.  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  There is concern pt may be close to ESRD and dialysis and/or kidney transplant.  During a previous hospitalization she was started on bumex 2 mg bid and sodium bicarb.  She was recently diagnosed with a viral URI with recommendations of supportive care and close outpatient follow-up on 03/25.  Pts daughter reported the pt has not been "acting the same" for about 2 weeks.  She also reported the pt went from a lying position to a seated position she had a seizure today that lasted for about 2-3 minutes. During the seizure the pts eyes rolled back and both her arms were shaking.  Following the seizure the pt appeared very sleepy for about 5-10 minutes.  EMS reported the pts vital signs were stable when they arrived.  ED Course Upon arrival to the ER pts daughter reported the pt appeared at her baseline, but the pt had no recollection of what happened prior to arrival in the ER.  Pt reported she has had emesis when taking her sodium bicarb tablet and loose stools over the past 24-36 hrs.  She also reported insomnia, on average only able to sleep 1-2 hrs per night over the past 2 weeks.  Lab results revealed Na+ 105, chloride 69, CO2 18, glucose 150, BUN 111, creatinine 6.24, calcium 6.9, anion gap 18, albumin 2.3, troponin 20, wbc 12.7, and hgb 8.8, platelets 440.  CT Head negative for acute intracranial findings or mass lesions.  She received 500 ml LR bolus.  ER physician contacted on call nephrologist Dr. Candiss Norse and he recommended the following:  3% saline 50 ml bolus x1 to raise Na+ above 110 with slow correction thereafter (goal of ~8 Meq correction in 24hrs).  Received 100 ml bolus of hypertonic saline per ER physician.  Pertinent  Medical History  COVID-19 dx Oct 2021 (received monoclonal antibody infusion) HTN  Stage IV CKD  Nephrotic Syndrome  Lupus  Pelvic Lymphadenopathy  Adiposity    Cultures:  06/11/20: Blood culture x2>> 06/11/20: Sputum>> 06/11/20: Strep pneumo urinary antigen>> negative 06/11/20: Legionella urinary antigen>> 06/11/20: Mycoplasma pneumoniae >> 06/11/20: Pneumocystis PCR>> 06/11/20: Urine>>  Antimicrobials:  Azithromycin 4/7>> Ceftriaxone 4/7>>  Significant Hospital Events: Including procedures, antibiotic start and stop dates in addition to other pertinent events   . Pt admitted to ICU with hyponatremia Na 105 received 3% saline 50 ml bolus  06/10/20: Remained on 3% Hypertonic saline, latest Na 112 this afternoon (Na 105 upon presentation @ 18:00 yesterday); per discussion with Nephrology will hold 3% Hypertonic saline and follow correction with PO intake 06/11/20: Serum Na remains low, placed on NS overnight. Placing back on 3% Saline by Nephrology, increasing WBC 22.9, CXR with left basilar opacity concerning for atelectasis vs. PNA, placed on empiric Azithromycin + Rocephin, cultures obtained 06/11/20: Serum Na remains below 120 (115), Hypertonic saline increased by Nephrology, Strep pneumo urinary antigen negative, all other cultures still pending.   Interim History / Subjective:  -Serum Na remains low, latest check this morning 115 -  3% Hypertonic saline increased by Nephrology -Strep pneumo urinary antigen negative, all other cultures still pending, continue empiric PNA coverage with Azithromycin & Rocephin -Afebrile, hemodynamically stable, on room air, NO vasopressors -Urine output 250 ml over past 24 hrs (+590 ml since admit) -Creatinine slightly better today @ 5.94, was 6.23 yesterday -Pt resting  comfortably in bed, denies all complaints, oriented to person and place -Daughter is at bedside and feels her mother is doing better  Objective   Blood pressure (!) 105/54, pulse 76, temperature 98.3 F (36.8 C), temperature source Oral, resp. rate 15, height '5\' 2"'$  (1.575 m), weight 75 kg, SpO2 100 %.        Intake/Output Summary (Last 24 hours) at 06/12/2020 1121 Last data filed at 06/12/2020 0900 Gross per 24 hour  Intake 1766.36 ml  Output 250 ml  Net 1516.36 ml   Filed Weights   06/10/20 0500 06/11/20 0500 06/12/20 0500  Weight: 69.5 kg 73.2 kg 75 kg    Examination: General: Acutely ill-appearing female, sleeping in bed, on room air, no acute distress HENT: Atraumatic, normal, neck supple, no JVD Lungs: Clear to auscultation bilaterally, no wheezing or rales noted, even, nonlabored, normal effort Cardiovascular: Regular rate and rhythm, S1-S2, no murmurs, rubs, gallops, 2+ radial pulses, 1+ distal pulses   Abdomen: Soft, nontender, nondistended, no guarding or rebound tenderness, bowel sounds positive x4 Extremities: Normal bulk and tone, no deformities, no edema Neuro: Sleeping, arouses easily to voice, oriented to self and place, follows commands, no focal deficits, speech clear, right pupil 2 mm reactive, left prosthetic eye  Labs/imaging that I havepersonally reviewed  (right click and "Reselect all SmartList Selections" daily)  Lab results reviewed 04/8: Na 115>>117, BUN 122, Cr. 5.94, AG 17, Bicarb 17, PCT 3.70, WBC 17.1, Hgb 7.1, HCT 20, Strep pneumo urinary antigen negative CXR 06/11/20>>Nonspecific left basilar airspace opacity obscures the left hemidiaphragm. Differential considerations include atelectasis, layering pleural effusion and/or infiltrate. 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 Candiss Norse, goal Na correction over next 24 hrs is 8-10 mEq, continue 3% Hypertonic Saline until serum Na 125 -Seizure precautions  -Trend BMP  -Replace electrolytes as indicated  -Monitor UOP -Avoid nephrotoxic medications  -Hold outpatient bumex   Mildly elevated troponin likely secondary to demand ischemia in the setting of severe hyponatremia  -Trend troponin (20 ~ 16)  Anemia without obvious acute blood loss  -Trend CBC  -Will resume outpatient ferrous sulfate  -Monitor for s/sx of bleeding and transfuse for hgb <7  Leukocytosis Concern for Left Basilar Pneumonia on CXR 06/11/20 -Monitor fever curve  -Trend WBC's and Procalcitonin -Follow cultures as above (strep pneumo is negative, all other cultures still pending) -Continue Azithromycin + Rocephin pending cultures and sensitivities  Lupus -Resume outpatient cellcept   Hyperglycemia  -CBG's ac/hs -SSI  -Follow ICU Hypo/hyperglycemia protocol  Seizure activity suspected secondary to severe hyponatremia~CT Head 04/5 negative  Acute encephalopathy  -Seizure precautions  -Prn ativan for seizure activity  -Frequent reorientation   Best practice (right click and "Reselect all SmartList Selections" daily)  Diet:  Oral Pain/Anxiety/Delirium protocol (if indicated): No VAP protocol (if indicated): No DVT prophylaxis: Subcutaneous Heparin GI prophylaxis: H2B Glucose  control:  SSI Yes Central venous access:  N/A Arterial line:  N/A Foley:  N/A Mobility:  bed rest  PT consulted: N/A Last date of multidisciplinary goals of care discussion [06/12/20] Code Status:  full code Disposition: ICU   Updated pt's daughter at bedside 06/12/20   Labs   CBC: Recent Labs  Lab 06/09/20 1747 06/09/20 1937 06/10/20 0454 06/11/20 0257  06/11/20 1054 06/12/20 0313  WBC 12.7* 12.1* 11.7* 22.9* 19.1* 17.1*  NEUTROABS 11.7*  --   --   --  17.7*  --   HGB 8.8* 8.1* 7.9* 7.6* 7.8* 7.1*  HCT 24.0* 22.3* 21.4* 20.8* 21.3* 20.0*  MCV 74.5* 75.6* 74.8* 75.6* 75.5* 77.5*  PLT 440* 416* 377 321 365 [id number]    Basic Metabolic Panel: Recent Labs  Lab 06/09/20 1747 06/09/20 1937 06/09/20 2243 06/10/20 0454 06/10/20 0619 06/11/20 0257 06/11/20 0639 06/11/20 1531 06/11/20 1925 06/11/20 2223 06/12/20 0313 06/12/20 0634  NA 105*  --    < > 108*   < > 109*  110*   < > 111* 112* 114* 114* 115*  K 4.4  --   --  4.1  --  4.0  --   --   --   --  3.9  --   CL 69*  --   --  72*  --  75*  --   --   --   --  80*  --   CO2 18*  --   --  19*  --  18*  --   --   --   --  17*  --   GLUCOSE 150*  --   --  98  --  100*  --   --   --   --  96  --   BUN 111*  --   --  104*  --  117*  --   --   --   --  122*  --   CREATININE 6.24* 5.88*  --  5.82*  --  6.23*  --   --   --   --  5.94*  --   CALCIUM 6.9*  --   --  6.8*  --  6.6*  --   --   --   --  6.3*  --   MG  --   --   --  1.9  --  2.0  --   --   --   --  1.9  --   PHOS  --   --   --  10.1*  --  10.0*  --   --   --   --  9.9*  --    < > = values in this interval not displayed.   GFR: Estimated Creatinine Clearance: 10.4 mL/min (A) (by C-G formula based on SCr of 5.94 mg/dL (H)). Recent Labs  Lab 06/09/20 1747 06/09/20 1937 06/10/20 0454 06/11/20 0257 06/11/20 1054 06/12/20 0313  PROCALCITON  --   --   --   --  4.00 3.70  WBC 12.7*   < > 11.7* 22.9* 19.1* 17.1*  LATICACIDVEN 1.9  --   --   --   --   --    < > = values in this interval not displayed.    Liver Function Tests: Recent Labs  Lab 06/09/20 1747 06/11/20 0257 06/11/20 1054 06/12/20 0313  AST 28  --  24  --   ALT 12  --  14  --  ALKPHOS 87  --  82  --   BILITOT 0.6  --  0.6  --   PROT 6.5  --  5.7*  --   ALBUMIN 2.3* 2.0* 2.1* 2.0*   No results for input(s): LIPASE, AMYLASE in the last 168 hours. No results  for input(s): AMMONIA in the last 168 hours.  ABG No results found for: PHART, PCO2ART, PO2ART, HCO3, TCO2, ACIDBASEDEF, O2SAT   Coagulation Profile: No results for input(s): INR, PROTIME in the last 168 hours.  Cardiac Enzymes: No results for input(s): CKTOTAL, CKMB, CKMBINDEX, TROPONINI in the last 168 hours.  HbA1C: No results found for: HGBA1C  CBG: Recent Labs  Lab 06/10/20 0019 06/11/20 0718 06/11/20 2144  GLUCAP 121* 90 100*    Review of Systems: Positives in BOLD  Gen: Denies fever, chills, weight change, fatigue, night sweats Pt currently denies all complaints HEENT: Denies blurred vision, double vision, hearing loss, tinnitus, sinus congestion, rhinorrhea, sore throat, neck stiffness, dysphagia PULM: Denies shortness of breath, cough, sputum production, hemoptysis, wheezing CV: Denies chest pain, edema, orthopnea, paroxysmal nocturnal dyspnea, palpitations GI: abdominal pain, nausea, vomiting, loose stools, diarrhea, hematochezia, melena, constipation, change in bowel habits GU: Denies dysuria, hematuria, polyuria, oliguria, urethral discharge Endocrine: Denies hot or cold intolerance, polyuria, polyphagia or appetite change Derm: Denies rash, dry skin, scaling or peeling skin change Heme: Denies easy bruising, bleeding, bleeding gums Neuro: seizures, headache, numbness, weakness, slurred speech, loss of memory or consciousness  Past Medical History:  She,  has a past medical history of Kidney disease, Lupus ([place]), and Prosthetic eye globe.   Surgical History:  History reviewed. No pertinent surgical history.   Social History:   reports that she has never smoked. She has never used smokeless tobacco. She reports previous alcohol use. She reports that she does not use drugs.   Family History:  Her family history is not on file.   Allergies No Known Allergies   Home Medications  Prior to Admission medications   Not on File     Critical care time: 33  minutes    Darel Hong, [facility] Pager: [phone]

## 2020-06-12 NOTE — Progress Notes (Addendum)
PHARMACY CONSULT NOTE - FOLLOW UP  Pharmacy Consult for Electrolyte Monitoring and Replacement   Recent Labs: Potassium (mmol/L)  Date Value  06/12/2020 3.9   Magnesium (mg/dL)  Date Value  06/12/2020 1.9   Calcium (mg/dL)  Date Value  06/12/2020 6.3 (LL)   Albumin (g/dL)  Date Value  06/12/2020 2.0 (L)   Phosphorus (mg/dL)  Date Value  06/12/2020 9.9 (H)   Sodium (mmol/L)  Date Value  06/12/2020 115 (LL)   Corrected Ca: 7.9  Assessment: 55 yo F presented with c/o seizure. Pt Na was found to be 105. Pharmacy has been consulted to monitor Na levels while on 3% NaCl infusion.   Goal of Therapy:  --Increase in sodium of 8 mEq in 24 hr period --If Na increases > 4 mEq/L in 2 hours or > 6 mEq/L in 4 hours call RN to stop infusion and notify MD  --All other electrolytes WNL  Date Time Na level 4/5 1747 105 4/5 2040 3% NaCl bolus given 4/5 2243 107 4/6 0032 107 4/6 0253 106 4/6 0445 3% NaCl bolus given  4/6 0454 108 4/6 0619 107 4/6 0745 3% NaCl continuous infusion started @ 0745 4/6 0855 109 4/6 1251 112 4/6 1939 111 4/6 2227 109 4/7 0257 110 4/7 0639 111 4/7 1054 111 4/7 - 3% NaCl continuous infusion was stopped sometime last night... restarted @ 1230 @ 20 mL/hr 4/7  1531 111 4/7 - 3% NaCl rate increased to 45 mL/hr 4/7 1925 112 4/7 2223 114 4/8 0313 114 4/8 0634 115 4/8 1054 117 4/8 1450 118  Plan:  --Na 115>117 - Continue 3% NaCl continuous infusion @ 45 mL/hr --Monitor sodium levels every 4 hours to ensure level does not increase by more than > 4 mEq/L in 2 hours or > 6 mEq/L in 4 hours --No additional replacement needed at this time --Monitor electrolyte levels with AM labs  Benn Moulder, PharmD Pharmacy Resident  06/12/2020 8:04 AM

## 2020-06-12 NOTE — Progress Notes (Signed)
Follow up with medical staff to make sure communion was acceptable, confirmed a yes. Spoke with daughter this morning to let her know Father Evette Doffing will visit today.

## 2020-06-12 NOTE — Progress Notes (Signed)
PHARMACY CONSULT NOTE - FOLLOW UP  Pharmacy Consult for Electrolyte Monitoring and Replacement   Recent Labs: Potassium (mmol/L)  Date Value  06/12/2020 3.9   Magnesium (mg/dL)  Date Value  06/12/2020 1.9   Calcium (mg/dL)  Date Value  06/12/2020 6.3 (LL)   Albumin (g/dL)  Date Value  06/12/2020 2.0 (L)   Phosphorus (mg/dL)  Date Value  06/12/2020 9.9 (H)   Sodium (mmol/L)  Date Value  06/12/2020 123 (L)   Corrected Ca: 7.9  Assessment: 55 yo F presented with c/o seizure. Pt Na was found to be 105. Pharmacy has been consulted to monitor Na levels while on 3% NaCl infusion.   Goal of Therapy:  --Increase in sodium of 8 mEq in 24 hr period --If Na increases > 4 mEq/L in 2 hours or > 6 mEq/L in 4 hours call RN to stop infusion and notify MD  --All other electrolytes WNL  Date Time Na level 4/5 1747 105 4/5 2040 3% NaCl bolus given 4/5 2243 107 4/6 0032 107 4/6 0253 106 4/6 0445 3% NaCl bolus given  4/6 0454 108 4/6 0619 107 4/6 0745 3% NaCl continuous infusion started @ 0745 4/6 0855 109 4/6 1251 112 4/6 1939 111 4/6 2227 109 4/7 0257 110 4/7 0639 111 4/7 1054 111 4/7 - 3% NaCl continuous infusion was stopped sometime last night... restarted @ 1230 @ 20 mL/hr 4/7  1531 111 4/7 - 3% NaCl rate increased to 45 mL/hr 4/7 1925 112 4/7 2223 114 4/8 0313 114 4/8 0634 115 4/8 1054 117 4/8 1450 118 4/8       1945    123  Plan:  --Na 118>123 in last 4hr, 112>123 in last 24hr - Pausing 3% NaCl and rechecking Na at 22:00. Follow up on next level. If 3% NaCl is resumed may need to cut rate in half.   Vira Blanco, PharmD Pharmacy Resident  06/12/2020 9:20 PM

## 2020-06-12 NOTE — TOC Initial Note (Addendum)
Transition of Care [facility]) - Initial/Assessment Note    Patient Details  Name: Carol Hicks MRN: [id number] Date of Birth: 16-May-1965  Transition of Care [facility]) CM/SW Contact:    Ova Freshwater Phone Number: [phone] 06/12/2020, 4:15 PM  Clinical Narrative:                  CSW left voicemail for Tytiana Sahli (daughter) [phone].  Patient remains critically ill, cultures still pending. TOC following.        Patient Goals and CMS Choice        Expected Discharge Plan and Services                                                Prior Living Arrangements/Services                       Activities of Daily Living Home Assistive Devices/Equipment: Shower chair with back,Walker (specify type) ADL Screening (condition at time of admission) Patient's cognitive ability adequate to safely complete daily activities?: Yes Is the patient deaf or have difficulty hearing?: No Does the patient have difficulty seeing, even when wearing glasses/contacts?: No Does the patient have difficulty concentrating, remembering, or making decisions?: Yes Patient able to express need for assistance with ADLs?: Yes Does the patient have difficulty dressing or bathing?: Yes Independently performs ADLs?: No Communication: Independent Dressing (OT): Independent,Needs assistance Grooming: Independent,Needs assistance Is this a change from baseline?: Pre-admission baseline Feeding: Independent Bathing: Needs assistance Is this a change from baseline?: Pre-admission baseline Toileting: Independent In/Out Bed: Independent with device (comment) Walks in Home: Independent with device (comment) Does the patient have difficulty walking or climbing stairs?: No Weakness of Legs: Both Weakness of Arms/Hands: None  Permission Sought/Granted                  Emotional Assessment              Admission diagnosis:  Hyponatremia [E87.1] Seizure ([place])  [R56.9] Acute renal failure, unspecified acute renal failure type ([place]) [N17.9] Patient Active Problem List   Diagnosis Date Noted  . Hyponatremia 06/09/2020   PCP:  Pcp, No Pharmacy:  No Pharmacies Listed    Social Determinants of Health (SDOH) Interventions    Readmission Risk Interventions No flowsheet data found.

## 2020-06-12 NOTE — Progress Notes (Signed)
[place], [place] 06/12/20  Subjective:   Hospital day # 3  Daughter reports that patient had some confusion yesterday in the form of repetition of conversation.  Able to eat ok without nausea or vomiting S Na improving with 3%    04/07 0701 - 04/08 0700 In: 1911 [P.O.:720; I.V.:831.1; IV Piggyback:359.8] Out: 250 [Urine:250] Lab Results  Component Value Date   CREATININE 5.94 (H) 06/12/2020   CREATININE 6.23 (H) 06/11/2020   CREATININE 5.82 (H) 06/10/2020     Objective:  Vital signs in last 24 hours:  Temp:  [98.2 F (36.8 C)-98.8 F (37.1 C)] 98.3 F (36.8 C) (04/08 0400) Pulse Rate:  [73-109] 76 (04/08 0900) Resp:  [15-26] 15 (04/08 0900) BP: (94-132)/(50-94) 105/54 (04/08 0900) SpO2:  [95 %-100 %] 100 % (04/08 0900) Weight:  [75 kg] 75 kg (04/08 0500)  Weight change: 1.8 kg Filed Weights   06/10/20 0500 06/11/20 0500 06/12/20 0500  Weight: 69.5 kg 73.2 kg 75 kg    Intake/Output:    Intake/Output Summary (Last 24 hours) at 06/12/2020 0955 Last data filed at 06/12/2020 0900 Gross per 24 hour  Intake 1841.36 ml  Output 250 ml  Net 1591.36 ml    Physical Exam: General:  No acute distress, laying in the bed  HEENT  moist oral mucous membrane  Pulm/lungs  normal breathing effort, lungs are clear to auscultation  CVS/Heart  regular rhythm, no rub or gallop  Abdomen:   Soft, nontender  Extremities:  No peripheral edema  Neurologic:  resting quietly  Skin:  No acute rashes    Basic Metabolic Panel:  Recent Labs  Lab 06/09/20 1747 06/09/20 1937 06/09/20 2243 06/10/20 0454 06/10/20 0619 06/11/20 0257 06/11/20 0639 06/11/20 1531 06/11/20 1925 06/11/20 2223 06/12/20 0313 06/12/20 0634  NA 105*  --    < > 108*   < > 109*  110*   < > 111* 112* 114* 114* 115*  K 4.4  --   --  4.1  --  4.0  --   --   --   --  3.9  --   CL 69*  --   --  72*  --  75*  --   --   --   --  80*  --   CO2 18*  --   --  19*  --  18*  --   --    --   --  17*  --   GLUCOSE 150*  --   --  98  --  100*  --   --   --   --  96  --   BUN 111*  --   --  104*  --  117*  --   --   --   --  122*  --   CREATININE 6.24* 5.88*  --  5.82*  --  6.23*  --   --   --   --  5.94*  --   CALCIUM 6.9*  --   --  6.8*  --  6.6*  --   --   --   --  6.3*  --   MG  --   --   --  1.9  --  2.0  --   --   --   --  1.9  --   PHOS  --   --   --  10.1*  --  10.0*  --   --   --   --  9.9*  --    < > = values in this interval not displayed.     CBC: Recent Labs  Lab 06/09/20 1747 06/09/20 1937 06/10/20 0454 06/11/20 0257 06/11/20 1054 06/12/20 0313  WBC 12.7* 12.1* 11.7* 22.9* 19.1* 17.1*  NEUTROABS 11.7*  --   --   --  17.7*  --   HGB 8.8* 8.1* 7.9* 7.6* 7.8* 7.1*  HCT 24.0* 22.3* 21.4* 20.8* 21.3* 20.0*  MCV 74.5* 75.6* 74.8* 75.6* 75.5* 77.5*  PLT 440* 416* 377 321 365 301     No results found for: HEPBSAG, HEPBSAB, HEPBIGM    Microbiology:  Recent Results (from the past 240 hour(s))  Resp Panel by RT-PCR (Flu A&B, Covid) Nasopharyngeal Swab     Status: None   Collection Time: 06/09/20  7:37 PM   Specimen: Nasopharyngeal Swab; Nasopharyngeal(NP) swabs in vial transport medium  Result Value Ref Range Status   SARS Coronavirus 2 by RT PCR NEGATIVE NEGATIVE Final    Comment: (NOTE) SARS-CoV-2 target nucleic acids are NOT DETECTED.  The SARS-CoV-2 RNA is generally detectable in upper respiratory specimens during the acute phase of infection. The lowest concentration of SARS-CoV-2 viral copies this assay can detect is 138 copies/mL. A negative result does not preclude SARS-Cov-2 infection and should not be used as the sole basis for treatment or other patient management decisions. A negative result may occur with  improper specimen collection/handling, submission of specimen other than nasopharyngeal swab, presence of viral mutation(s) within the areas targeted by this assay, and inadequate number of viral copies(<138 copies/mL). A negative  result must be combined with clinical observations, patient history, and epidemiological information. The expected result is Negative.  Fact Sheet for Patients:  EntrepreneurPulse.com.au  Fact Sheet for Healthcare Providers:  IncredibleEmployment.be  This test is no t yet approved or cleared by the [place] FDA and  has been authorized for detection and/or diagnosis of SARS-CoV-2 by FDA under an Emergency Use Authorization (EUA). This EUA will remain  in effect (meaning this test can be used) for the duration of the COVID-19 declaration under Section 564(b)(1) of the Act, 21 U.S.C.section 360bbb-3(b)(1), unless the authorization is terminated  or revoked sooner.       Influenza A by PCR NEGATIVE NEGATIVE Final   Influenza B by PCR NEGATIVE NEGATIVE Final    Comment: (NOTE) The Xpert Xpress SARS-CoV-2/FLU/RSV plus assay is intended as an aid in the diagnosis of influenza from Nasopharyngeal swab specimens and should not be used as a sole basis for treatment. Nasal washings and aspirates are unacceptable for Xpert Xpress SARS-CoV-2/FLU/RSV testing.  Fact Sheet for Patients: EntrepreneurPulse.com.au  Fact Sheet for Healthcare Providers: IncredibleEmployment.be  This test is not yet approved or cleared by the [place] FDA and has been authorized for detection and/or diagnosis of SARS-CoV-2 by FDA under an Emergency Use Authorization (EUA). This EUA will remain in effect (meaning this test can be used) for the duration of the COVID-19 declaration under Section 564(b)(1) of the Act, 21 U.S.C. section 360bbb-3(b)(1), unless the authorization is terminated or revoked.  Performed at [facility], [address] [place], [place] [address]   MRSA PCR Screening     Status: None   Collection Time: 06/10/20 12:24 AM   Specimen: Nasopharyngeal  Result Value Ref Range Status   MRSA by PCR  NEGATIVE NEGATIVE Final    Comment:        The GeneXpert MRSA Assay (FDA approved for NASAL specimens only), is one component  of a comprehensive MRSA colonization surveillance program. It is not intended to diagnose MRSA infection nor to guide or monitor treatment for MRSA infections. Performed at [facility], [address] [place], [place] [address]   CULTURE, BLOOD (ROUTINE X 2) w Reflex to ID Panel     Status: None (Preliminary result)   Collection Time: 06/11/20 10:54 AM   Specimen: BLOOD RIGHT HAND  Result Value Ref Range Status   Specimen Description BLOOD RIGHT HAND  Final   Special Requests   Final    BOTTLES DRAWN AEROBIC AND ANAEROBIC Blood Culture results may not be optimal due to an excessive volume of blood received in culture bottles   Culture   Final    NO GROWTH < 24 HOURS Performed at [facility], [address] [place], [place] [address]    Report Status PENDING  Incomplete  CULTURE, BLOOD (ROUTINE X 2) w Reflex to ID Panel     Status: None (Preliminary result)   Collection Time: 06/11/20 11:04 AM   Specimen: BLOOD RIGHT ARM  Result Value Ref Range Status   Specimen Description BLOOD RIGHT ARM  Final   Special Requests   Final    BOTTLES DRAWN AEROBIC AND ANAEROBIC Blood Culture results may not be optimal due to an excessive volume of blood received in culture bottles   Culture   Final    NO GROWTH < 24 HOURS Performed at [facility], [address] [place], [place] [address]    Report Status PENDING  Incomplete    Coagulation Studies: No results for input(s): LABPROT, INR in the last 72 hours.  Urinalysis: Recent Labs    06/10/20 0606  COLORURINE YELLOW*  LABSPEC 1.011  PHURINE 5.0  GLUCOSEU 50*  HGBUR MODERATE*  BILIRUBINUR NEGATIVE  KETONESUR NEGATIVE  PROTEINUR >=300*  NITRITE NEGATIVE  LEUKOCYTESUR NEGATIVE      Imaging: DG Chest Port 1 View  Result Date: 06/11/2020 CLINICAL DATA:  Leukocytosis,  history of systemic lupus erythematosus EXAM: PORTABLE CHEST 1 VIEW COMPARISON:  None. FINDINGS: The right lung is clear and negative for focal airspace consolidation, pulmonary edema or suspicious pulmonary nodule. Nonspecific left basilar airspace opacity resulting in obscuration of the left hemidiaphragm. No pleural effusion or pneumothorax. Cardiac and mediastinal contours are within normal limits. No acute fracture or lytic or blastic osseous lesions. The visualized upper abdominal bowel gas pattern is unremarkable. IMPRESSION: Nonspecific left basilar airspace opacity obscures the left hemidiaphragm. Differential considerations include atelectasis, layering pleural effusion and/or infiltrate. Recommend dedicated PA and lateral chest x-ray. Electronically Signed   By: Jacqulynn Cadet M.D.   On: 06/11/2020 09:28     Medications:   . azithromycin Stopped (06/11/20 1711)  . cefTRIAXone (ROCEPHIN)  IV Stopped (06/11/20 1610)  . sodium chloride (hypertonic) 45 mL/hr at 06/12/20 0900   . Chlorhexidine Gluconate Cloth  6 each Topical Q0600  . heparin  5,000 Units Subcutaneous Q8H  . mycophenolate  500 mg Oral BID  . sevelamer carbonate  800 mg Oral TID WC   acetaminophen, docusate sodium, LORazepam, ondansetron (ZOFRAN) IV, polyethylene glycol  Assessment/ Plan:  55 y.o. 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 EM with membranous pattern and extraglomerular immune deposits as well as some inflammation and fibrosis in the interstitium by EM. Favoring autoimmune process."  Was on Cell cept, plaquenil and prednisone as outpatient  -continue cellcept and monitor No obvious uremic symptoms  will monitor closely. May need HD next week if episodes of confusion continue  #Hypertension  BP Readings from Last 1 Encounters:  06/12/20 (!) 105/54    Was on amlodipine, losartan and bumetanide at home  #Hyperphosphatemia likely due to secondary hyperparathyroidism continue sevelamer with meals   LOS: [place] 4/8/20229:55 AM  [place], [place]  Note: This note was prepared with Dragon dictation. Any transcription errors are unintentional

## 2020-06-13 DIAGNOSIS — R569 Unspecified convulsions: Secondary | ICD-10-CM | POA: Diagnosis not present

## 2020-06-13 DIAGNOSIS — N179 Acute kidney failure, unspecified: Secondary | ICD-10-CM | POA: Diagnosis not present

## 2020-06-13 DIAGNOSIS — E871 Hypo-osmolality and hyponatremia: Secondary | ICD-10-CM | POA: Diagnosis not present

## 2020-06-13 LAB — SODIUM
Sodium: 120 mmol/L — ABNORMAL LOW (ref 135–145)
Sodium: 120 mmol/L — ABNORMAL LOW (ref 135–145)
Sodium: 122 mmol/L — ABNORMAL LOW (ref 135–145)
Sodium: 122 mmol/L — ABNORMAL LOW (ref 135–145)
Sodium: 122 mmol/L — ABNORMAL LOW (ref 135–145)
Sodium: 124 mmol/L — ABNORMAL LOW (ref 135–145)

## 2020-06-13 LAB — PROCALCITONIN: Procalcitonin: 3.56 ng/mL

## 2020-06-13 LAB — RENAL FUNCTION PANEL
Albumin: 1.8 g/dL — ABNORMAL LOW (ref 3.5–5.0)
Anion gap: 15 (ref 5–15)
BUN: 117 mg/dL — ABNORMAL HIGH (ref 6–20)
CO2: 16 mmol/L — ABNORMAL LOW (ref 22–32)
Calcium: 6 mg/dL — CL (ref 8.9–10.3)
Chloride: 89 mmol/L — ABNORMAL LOW (ref 98–111)
Creatinine, Ser: 6.02 mg/dL — ABNORMAL HIGH (ref 0.44–1.00)
GFR, Estimated: 8 mL/min — ABNORMAL LOW (ref >60)
Glucose, Bld: 89 mg/dL (ref 70–99)
Phosphorus: 10 mg/dL — ABNORMAL HIGH (ref 2.5–4.6)
Potassium: 3.9 mmol/L (ref 3.5–5.1)
Sodium: 120 mmol/L — ABNORMAL LOW (ref 135–145)

## 2020-06-13 LAB — CBC
HCT: 18.8 % — ABNORMAL LOW (ref 36.0–46.0)
Hemoglobin: 6.5 g/dL — ABNORMAL LOW (ref 12.0–15.0)
MCH: 27.5 pg (ref 26.0–34.0)
MCHC: 34.6 g/dL (ref 30.0–36.0)
MCV: 79.7 fL — ABNORMAL LOW (ref 80.0–100.0)
Platelets: 296 10*3/uL (ref 150–400)
RBC: 2.36 MIL/uL — ABNORMAL LOW (ref 3.87–5.11)
RDW: 12 % (ref 11.5–15.5)
WBC: 11.3 10*3/uL — ABNORMAL HIGH (ref 4.0–10.5)
nRBC: 0 % (ref 0.0–0.2)

## 2020-06-13 LAB — HEMOGLOBIN AND HEMATOCRIT, BLOOD
HCT: 22.9 % — ABNORMAL LOW (ref 36.0–46.0)
Hemoglobin: 7.9 g/dL — ABNORMAL LOW (ref 12.0–15.0)

## 2020-06-13 LAB — LEGIONELLA PNEUMOPHILA SEROGP 1 UR AG: L. pneumophila Serogp 1 Ur Ag: NEGATIVE

## 2020-06-13 LAB — MAGNESIUM: Magnesium: 1.9 mg/dL (ref 1.7–2.4)

## 2020-06-13 LAB — ABO/RH: ABO/RH(D): O POS

## 2020-06-13 LAB — SEDIMENTATION RATE: Sed Rate: 106 mm/hr — ABNORMAL HIGH (ref 0–30)

## 2020-06-13 LAB — PREPARE RBC (CROSSMATCH)

## 2020-06-13 LAB — C-REACTIVE PROTEIN: CRP: 6.3 mg/dL — ABNORMAL HIGH (ref <1.0)

## 2020-06-13 MED ORDER — SODIUM CHLORIDE 0.9% IV SOLUTION: Freq: Once | INTRAVENOUS | Status: AC

## 2020-06-13 MED ORDER — METOCLOPRAMIDE HCL 5 MG/ML IJ SOLN
5.0000 mg | Freq: Three times a day (TID) | INTRAMUSCULAR | Status: DC
  Administered 2020-06-13 – 2020-06-22 (×24): 5 mg via INTRAVENOUS
  Filled 2020-06-13 (×24): qty 2

## 2020-06-13 MED ORDER — PANTOPRAZOLE SODIUM 40 MG IV SOLR
40.0000 mg | Freq: Every day | INTRAVENOUS | Status: DC
  Administered 2020-06-13 – 2020-06-19 (×7): 40 mg via INTRAVENOUS
  Filled 2020-06-13 (×7): qty 40

## 2020-06-13 NOTE — Progress Notes (Signed)
Severe Acute Hyponatremia Patient Na+ increased by 11 meq in 24 hour (123),  higher than goal 8-10 meq (120-122) Pharmacy consulted, 3 % NS paused, repeat Na+ 2 hours later showed slight drop into goal range of 122. - restart 3% NS at reduced rate of 20 mL/h - repeat Na+ check in 2 hours for further adjustment if needed - neurological status remains stable   [name], AGACNP-BC Acute Care Nurse Practitioner [place]   [phone] / [phone] Please see Amion for pager details.

## 2020-06-13 NOTE — Progress Notes (Addendum)
NAME:  Carol Hicks, MRN:  [id number], DOB:  Apr 13, 1965, LOS: 4 ADMISSION DATE:  06/09/2020,  INITIAL CONSULTATION DATE: 06/09/2020  REFERRING MD: Dr. Tamala Julian, CHIEF COMPLAINT: Seizures   History of Present Illness:  This is a 55 yo female who presented to [facility] ER on 04/5 from home with seizure activity.  Upon review of records pt has a hx of lupus and is currently being worked up for possible lupus nephritis at [facility], and recently started CellCept 250 mg 2 capsules bid on 03/18.  There is concern pt may be close to ESRD and dialysis and/or kidney transplant.  During a previous hospitalization she was started on bumex 2 mg bid and sodium bicarb.  She was recently diagnosed with a viral URI with recommendations of supportive care and close outpatient follow-up on 03/25.  Pts daughter reported the pt has not been "acting the same" for about 2 weeks.  She also reported the pt went from a lying position to a seated position she had a seizure today that lasted for about 2-3 minutes. During the seizure the pts eyes rolled back and both her arms were shaking.  Following the seizure the pt appeared very sleepy for about 5-10 minutes.  EMS reported the pts vital signs were stable when they arrived.  ED Course Upon arrival to the ER pts daughter reported the pt appeared at her baseline, but the pt had no recollection of what happened prior to arrival in the ER.  Pt reported she has had emesis when taking her sodium bicarb tablet and loose stools over the past 24-36 hrs.  She also reported insomnia, on average only able to sleep 1-2 hrs per night over the past 2 weeks.  Lab results revealed Na+ 105, chloride 69, CO2 18, glucose 150, BUN 111, creatinine 6.24, calcium 6.9, anion gap 18, albumin 2.3, troponin 20, wbc 12.7, and hgb 8.8, platelets 440.  CT Head negative for acute intracranial findings or mass lesions.  She received 500 ml LR bolus.  ER physician contacted on call nephrologist Dr. Candiss Norse and he recommended the  following: 3% saline 50 ml bolus x1 to raise Na+ above 110 with slow correction thereafter (goal of ~8 Meq correction in 24hrs).  Received 100 ml bolus of hypertonic saline per ER physician.  Pertinent  Medical History  COVID-19 dx Oct 2021 (received monoclonal antibody infusion) HTN  Stage IV CKD  Nephrotic Syndrome  Lupus  Pelvic Lymphadenopathy  Adiposity    Cultures:  06/11/20: Blood culture x2>> 06/11/20: Sputum>> 06/11/20: Strep pneumo urinary antigen>> negative 06/11/20: Legionella urinary antigen>> 06/11/20: Mycoplasma pneumoniae >> 06/11/20: Pneumocystis PCR>> 06/11/20: Urine>>  Antimicrobials:  Azithromycin 4/7>> Ceftriaxone 4/7>>  Significant Hospital Events: Including procedures, antibiotic start and stop dates in addition to other pertinent events   . Pt admitted to ICU with hyponatremia Na 105 received 3% saline 50 ml bolus  06/10/20: Remained on 3% Hypertonic saline, latest Na 112 this afternoon (Na 105 upon presentation @ 18:00 yesterday); per discussion with Nephrology will hold 3% Hypertonic saline and follow correction with PO intake 06/11/20: Serum Na remains low, placed on NS overnight. Placing back on 3% Saline by Nephrology, increasing WBC 22.9, CXR with left basilar opacity concerning for atelectasis vs. PNA, placed on empiric Azithromycin + Rocephin, cultures obtained 06/11/20: Serum Na remains below 120 (115), Hypertonic saline increased by Nephrology, Strep pneumo urinary antigen negative, all other cultures still pending.  06/13/2020: Patient noted to have significant anemia, consent obtained for transfusion.  Sodium  122, continue 3% saline at reduced dose of 20 mL/h.  Interim History / Subjective:  -Serum Na remains low, on upward trend latest check this morning 122 -3% Hypertonic saline decreased -Strep pneumo urinary antigen negative, pro calcitonin on downward trend but still 3.56, continue empiric PNA coverage with Azithromycin & Rocephin -Afebrile, hemodynamically  stable, on room air, NO vasopressors -Urine output 1,200 ml over past 24 hrs (+1514 ml since admit) -Creatinine slightly better today @ 5.94, was 6.23 yesterday -Pt resting comfortably in bed, denies all complaints, oriented to person and place -Updated husband and daughter at bedside - 6.5 and 18.8 H&H, consent obtained for blood transfusion  Objective   Blood pressure 118/66, pulse 74, temperature 98.5 F (36.9 C), temperature source Oral, resp. rate (!) 22, height '5\' 2"'$  (1.575 m), weight 75.9 kg, SpO2 100 %.        Intake/Output Summary (Last 24 hours) at 06/13/2020 1416 Last data filed at 06/13/2020 1100 Gross per 24 hour  Intake 1912.01 ml  Output 1200 ml  Net 712.01 ml   Filed Weights   06/11/20 0500 06/12/20 0500 06/13/20 0500  Weight: 73.2 kg 75 kg 75.9 kg    Examination: General: Acutely ill-appearing female, pale, on room air, no acute distress HENT: Atraumatic, normal, neck supple, no JVD Lungs: Clear to auscultation bilaterally, no wheezing or rales noted, even, nonlabored, normal effort Cardiovascular: Regular rate and rhythm, S1-S2, no murmurs, rubs, gallops, 2+ radial pulses, 1+ distal pulses   Abdomen: Soft, nontender, nondistended, no guarding or rebound tenderness, bowel sounds positive x4 Extremities: Normal bulk and tone, no deformities, no edema Neuro: Sleeping, arouses easily to voice, oriented to self and place, follows commands, no focal deficits, speech clear, right pupil 2 mm reactive, left prosthetic eye  Labs/imaging that I havepersonally reviewed  (right click and "Reselect all SmartList Selections" daily)  Lab results reviewed 04/9: H&H 6.5/18.8, sodium 122, sed rate 106, 117, creatinine 6.02 remainder of laboratory data as below reviewed. CXR 06/11/20>>Nonspecific left basilar airspace opacity obscures the left hemidiaphragm. Differential considerations include atelectasis, layering pleural effusion and/or infiltrate. CT Head 06/09/2020: negative for  acute intracranial findings or mass lesions  Resolved Hospital Problem list   N/A  Assessment & Plan:   Severe hyponatremia and hypochloremia likely diuretic induced Anion gap metabolic acidosis  Chronic kidney disease stage V Hx: lupus nephritis  -Continuous telemetry monitoring  -Serial Na+ monitoring q4h -Nephrology following, appreciate input -As per discussion with nephrology goal Na correction over next 24 hrs is 8-10 mEq, continue 3% Hypertonic Saline until serum Na 125 -Seizure precautions  -Trend BMP  -Replace electrolytes as indicated  -Monitor UOP -Avoid nephrotoxic medications  -Hold outpatient bumex   Mildly elevated troponin likely secondary to demand ischemia in the setting of severe hyponatremia  -Trend troponin (20 ~ 16)  Anemia without obvious acute blood loss  -Trend CBC  -Will resume outpatient ferrous sulfate  -Monitor for s/sx of bleeding -Transfusing for hemoglobin less than 7  Leukocytosis Concern for Left Basilar Pneumonia on CXR 06/11/20 -Monitor fever curve  -Trend WBC's and Procalcitonin -Procalcitonin on downward trend however still 3.56 -Follow cultures as above (strep pneumo is negative, all other cultures still pending) -Continue Azithromycin + Rocephin pending cultures and sensitivities  Lupus -Resume outpatient cellcept  -Check sed rate and CRP if elevated may need pulse steroids -Query lupus flare  Hyperglycemia  -CBG's ac/hs -SSI  -Follow ICU Hypo/hyperglycemia protocol  Seizure activity suspected secondary to severe hyponatremia~CT Head 04/5 negative  Acute encephalopathy  -Seizure precautions  -PRN ativan for seizure activity  -Frequent reorientation   Best practice (right click and "Reselect all SmartList Selections" daily)  Diet:  Oral Pain/Anxiety/Delirium protocol (if indicated): No VAP protocol (if indicated): No DVT prophylaxis: Subcutaneous Heparin GI prophylaxis: H2B Glucose control:  SSI Yes Central venous  access:  N/A Arterial line:  N/A Foley:  N/A Mobility:  bed rest  PT consulted: N/A Last date of multidisciplinary goals of care discussion [06/15/20] Code Status:  full code Disposition: ICU  Updated pt's husband and daughter at bedside 06/13/20.  Answered all of patient's questions.  Labs   CBC: Recent Labs  Lab 06/09/20 1747 06/09/20 1937 06/10/20 0454 06/11/20 0257 06/11/20 1054 06/12/20 0313 06/13/20 0442  WBC 12.7*   < > 11.7* 22.9* 19.1* 17.1* 11.3*  NEUTROABS 11.7*  --   --   --  17.7*  --   --   HGB 8.8*   < > 7.9* 7.6* 7.8* 7.1* 6.5*  HCT 24.0*   < > 21.4* 20.8* 21.3* 20.0* 18.8*  MCV 74.5*   < > 74.8* 75.6* 75.5* 77.5* 79.7*  PLT 440*   < > 377 321 365 301 296   < > = values in this interval not displayed.    Basic Metabolic Panel: Recent Labs  Lab 06/09/20 1747 06/09/20 1937 06/09/20 2243 06/10/20 0454 06/10/20 [id number] 06/11/20 0257 06/11/20 [id number] 06/12/20 0313 06/12/20 [id number] 06/12/20 2248 06/13/20 0213 06/13/20 0442 06/13/20 0638 06/13/20 1052  NA 105*  --    < > 108*   < > 109*  110*   < > 114*   < > 122* 120* 120* 120* 122*  K 4.4  --   --  4.1  --  4.0  --  3.9  --   --   --  3.9  --   --   CL 69*  --   --  72*  --  75*  --  80*  --   --   --  89*  --   --   CO2 18*  --   --  19*  --  18*  --  17*  --   --   --  16*  --   --   GLUCOSE 150*  --   --  98  --  100*  --  96  --   --   --  89  --   --   BUN 111*  --   --  104*  --  117*  --  122*  --   --   --  117*  --   --   CREATININE 6.24* 5.88*  --  5.82*  --  6.23*  --  5.94*  --   --   --  6.02*  --   --   CALCIUM 6.9*  --   --  6.8*  --  6.6*  --  6.3*  --   --   --  6.0*  --   --   MG  --   --   --  1.9  --  2.0  --  1.9  --   --   --  1.9  --   --   PHOS  --   --   --  10.1*  --  10.0*  --  9.9*  --   --   --  10.0*  --   --    < > =  values in this interval not displayed.   GFR: Estimated Creatinine Clearance: 10.3 mL/min (A) (by C-G formula based on SCr of 6.02 mg/dL (H)). Recent Labs  Lab  06/09/20 1747 06/09/20 1937 06/11/20 0257 06/11/20 1054 06/12/20 0313 06/13/20 0442  PROCALCITON  --   --   --  4.00 3.70 3.56  WBC 12.7*   < > 22.9* 19.1* 17.1* 11.3*  LATICACIDVEN 1.9  --   --   --   --   --    < > = values in this interval not displayed.    Liver Function Tests: Recent Labs  Lab 06/09/20 1747 06/11/20 0257 06/11/20 1054 06/12/20 0313 06/13/20 0442  AST 28  --  24  --   --   ALT 12  --  14  --   --   ALKPHOS 87  --  82  --   --   BILITOT 0.6  --  0.6  --   --   PROT 6.5  --  5.7*  --   --   ALBUMIN 2.3* 2.0* 2.1* 2.0* 1.8*   No results for input(s): LIPASE, AMYLASE in the last 168 hours. No results for input(s): AMMONIA in the last 168 hours.  ABG No results found for: PHART, PCO2ART, PO2ART, HCO3, TCO2, ACIDBASEDEF, O2SAT   Coagulation Profile: No results for input(s): INR, PROTIME in the last 168 hours.  Cardiac Enzymes: No results for input(s): CKTOTAL, CKMB, CKMBINDEX, TROPONINI in the last 168 hours.  HbA1C: No results found for: HGBA1C  CBG: Recent Labs  Lab 06/10/20 0019 06/11/20 0718 06/11/20 2144  GLUCAP 121* 90 100*    Review of Systems:   A 10 point review of systems was performed and it is as noted above otherwise negative.  Allergies No Known Allergies   Scheduled Meds: . Chlorhexidine Gluconate Cloth  6 each Topical Q0600  . heparin  5,000 Units Subcutaneous Q8H  . mycophenolate  500 mg Oral BID  . sevelamer carbonate  800 mg Oral TID WC   Continuous Infusions: . azithromycin Stopped (06/12/20 1719)  . cefTRIAXone (ROCEPHIN)  IV Stopped (06/12/20 1556)  . sodium chloride (hypertonic) 20 mL/hr at 06/13/20 1328   PRN Meds:.acetaminophen, docusate sodium, LORazepam, ondansetron (ZOFRAN) IV, polyethylene glycol   Level 3 follow-up    C. Derrill Kay, MD [place] [place] [facility]   *This note was dictated using voice recognition software/Dragon.  Despite best efforts to proofread, errors can occur which can change the  meaning.  Any change was purely unintentional.

## 2020-06-13 NOTE — Progress Notes (Addendum)
PHARMACY CONSULT NOTE - FOLLOW UP  Pharmacy Consult for Electrolyte Monitoring and Replacement   Recent Labs: Potassium (mmol/L)  Date Value  06/12/2020 3.9   Magnesium (mg/dL)  Date Value  06/12/2020 1.9   Calcium (mg/dL)  Date Value  06/12/2020 6.3 (LL)   Albumin (g/dL)  Date Value  06/12/2020 2.0 (L)   Phosphorus (mg/dL)  Date Value  06/12/2020 9.9 (H)   Sodium (mmol/L)  Date Value  06/12/2020 122 (L)   Corrected Ca: 7.9  Assessment: 55 yo F presented with c/o seizure. Pt Na was found to be 105. Pharmacy has been consulted to monitor Na levels while on 3% NaCl infusion.   Goal of Therapy:  --Increase in sodium of 8 mEq in 24 hr period --If Na increases > 4 mEq/L in 2 hours or > 6 mEq/L in 4 hours call RN to stop infusion and notify MD  --All other electrolytes WNL  Date Time Na level 4/5 1747 105 4/5 2040 3% NaCl bolus given 4/5 2243 107 4/6 0032 107 4/6 0253 106 4/6 0445 3% NaCl bolus given  4/6 0454 108 4/6 0619 107 4/6 0745 3% NaCl continuous infusion started @ 0745 4/6 0855 109 4/6 1251 112 4/6 1939 111 4/6 2227 109 4/7 0257 110 4/7 0639 111 4/7 1054 111 4/7 - 3% NaCl continuous infusion was stopped sometime last night... restarted @ 1230 @ 20 mL/hr 4/7  1531 111 4/7 - 3% NaCl rate increased to 45 mL/hr 4/7 1925 112 4/7 2223 114 4/8 0313 114 4/8 0634 115 4/8 1054 117 4/8 1450 118 4/8       1946    123 4/8 2248 122 4/9 0213 120 4/9 0442 120  Plan:  --Na 118>123 in last 4hr, 112>123 in last 24hr - Pausing 3% NaCl and rechecking Na at 22:00.  --Na 123>122 in last 3 hr.  NP to restart 3% NaCl at 20 ml/hr to maintain current level.  Will recheck in 2 hours.  --Na 122>120 in last 3 hr.  NP to increase 3% NaCl to 30 ml/hr and will recheck Na in 2 hours.  --Na stable for past ~3 hr.  Will continue 3% NaCl at 30 ml/hr.  Renda Rolls, PharmD, [facility] 06/13/2020 12:08 AM

## 2020-06-13 NOTE — Progress Notes (Signed)
PHARMACY CONSULT NOTE - FOLLOW UP  Pharmacy Consult for Electrolyte Monitoring and Replacement   Recent Labs: Potassium (mmol/L)  Date Value  06/13/2020 3.9   Magnesium (mg/dL)  Date Value  06/13/2020 1.9   Calcium (mg/dL)  Date Value  06/13/2020 6.0 (LL)   Albumin (g/dL)  Date Value  06/13/2020 1.8 (L)   Phosphorus (mg/dL)  Date Value  06/13/2020 10.0 (H)   Sodium (mmol/L)  Date Value  06/13/2020 120 (L)   Corrected Ca: 7.9  Assessment: 55 yo F presented with c/o seizure. Pt Na was found to be 105. Pharmacy has been consulted to monitor Na levels while on 3% NaCl infusion.   Goal of Therapy:  --Increase in sodium of 8 mEq in 24 hr period --If Na increases > 4 mEq/L in 2 hours or > 6 mEq/L in 4 hours call RN to stop infusion and notify MD  --All other electrolytes WNL   Plan:  Hypertonic saline running at 30 mL/hr. No other electrolyte replacement needed at this time. F/u with sodium level every 4 hours and BMP with AM labs.   Eleonore Chiquito, PharmD 06/13/2020 8:47 AM

## 2020-06-13 NOTE — Progress Notes (Signed)
PHARMACY CONSULT NOTE - FOLLOW UP  Pharmacy Consult for Electrolyte Monitoring and Replacement   Recent Labs: Potassium (mmol/L)  Date Value  06/13/2020 3.9   Magnesium (mg/dL)  Date Value  06/13/2020 1.9   Calcium (mg/dL)  Date Value  06/13/2020 6.0 (LL)   Albumin (g/dL)  Date Value  06/13/2020 1.8 (L)   Phosphorus (mg/dL)  Date Value  06/13/2020 10.0 (H)   Sodium (mmol/L)  Date Value  06/13/2020 122 (L)   Assessment: 55 yo F presented with c/o seizure. Pt Na was found to be 105. Pharmacy has been consulted to monitor Na levels while on 3% NaCl infusion.   Tonight, Na level unchanged from earlier this afternoon (122) with hypertonic saline rate at 66m/hr.  Goal of Therapy:  --Increase in sodium of 8 mEq in 24 hr period --If Na increases > 4 mEq/L in 2 hours or > 6 mEq/L in 4 hours call RN to stop infusion and notify MD  --All other electrolytes WNL  Plan:  F/u with sodium level every 4 hours F/u other electrolytes via BMP with AM labs   ABrendolyn Patty PharmD Clinical Pharmacist  06/13/2020   10:11 PM

## 2020-06-13 NOTE — Progress Notes (Signed)
[facility]  PROGRESS NOTE   Subjective:   Patient seen at bedside comfortable. Denies any chest pain or shortness of breath. Family is at bedside able to translate. No further evidence of seizures. Was found to have severe hyponatremia and was treated with 3% saline. She is now on 30 cc an hour 3% hypertonic saline.  Objective:  Vital signs in last 24 hours:  Temp:  [97.9 F (36.6 C)-98.8 F (37.1 C)] 98.8 F (37.1 C) (04/09 1130) Pulse Rate:  [66-90] 67 (04/09 1130) Resp:  [14-25] 20 (04/09 1130) BP: (99-133)/(53-79) 121/75 (04/09 1130) SpO2:  [94 %-100 %] 100 % (04/09 1130) Weight:  [75.9 kg] 75.9 kg (04/09 0500)  Weight change: 0.9 kg Filed Weights   06/11/20 0500 06/12/20 0500 06/13/20 0500  Weight: 73.2 kg 75 kg 75.9 kg    Intake/Output: I/O last 3 completed shifts: In: 2415.4 [P.O.:820; I.V.:1220; IV Piggyback:375.4] Out: 1300 [Urine:1300]   Intake/Output this shift:  Total I/O In: 380 [I.V.:20; Blood:360] Out: -   Physical Exam: General:  No acute distress  Head:  Normocephalic, atraumatic. Moist oral mucosal membranes  Eyes:  Anicteric  Neck:  Supple  Lungs:   Clear to auscultation, normal effort  Heart:  S1S2 no rubs  Abdomen:   Soft, nontender, bowel sounds present  Extremities:  peripheral edema.  Neurologic:  Awake, alert, following commands  Skin:  No lesions  Access:     Basic Metabolic Panel: Recent Labs  Lab 06/09/20 1747 06/09/20 1937 06/09/20 2243 06/10/20 0454 06/10/20 [id number] 06/11/20 0257 06/11/20 [id number] 06/12/20 0313 06/12/20 [id number] 06/12/20 2248 06/13/20 0213 06/13/20 0442 06/13/20 0638 06/13/20 1052  NA 105*  --    < > 108*   < > 109*  110*   < > 114*   < > 122* 120* 120* 120* 122*  K 4.4  --   --  4.1  --  4.0  --  3.9  --   --   --  3.9  --   --   CL 69*  --   --  72*  --  75*  --  80*  --   --   --  89*  --   --   CO2 18*  --   --  19*  --  18*  --  17*  --   --   --  16*  --   --   GLUCOSE 150*  --   --   98  --  100*  --  96  --   --   --  89  --   --   BUN 111*  --   --  104*  --  117*  --  122*  --   --   --  117*  --   --   CREATININE 6.24* 5.88*  --  5.82*  --  6.23*  --  5.94*  --   --   --  6.02*  --   --   CALCIUM 6.9*  --   --  6.8*  --  6.6*  --  6.3*  --   --   --  6.0*  --   --   MG  --   --   --  1.9  --  2.0  --  1.9  --   --   --  1.9  --   --   PHOS  --   --   --  10.1*  --  10.0*  --  9.9*  --   --   --  10.0*  --   --    < > = values in this interval not displayed.    Liver Function Tests: Recent Labs  Lab 06/09/20 1747 06/11/20 0257 06/11/20 1054 06/12/20 0313 06/13/20 0442  AST 28  --  24  --   --   ALT 12  --  14  --   --   ALKPHOS 87  --  82  --   --   BILITOT 0.6  --  0.6  --   --   PROT 6.5  --  5.7*  --   --   ALBUMIN 2.3* 2.0* 2.1* 2.0* 1.8*   No results for input(s): LIPASE, AMYLASE in the last 168 hours. No results for input(s): AMMONIA in the last 168 hours.  CBC: Recent Labs  Lab 06/09/20 1747 06/09/20 1937 06/10/20 0454 06/11/20 0257 06/11/20 1054 06/12/20 0313 06/13/20 0442  WBC 12.7*   < > 11.7* 22.9* 19.1* 17.1* 11.3*  NEUTROABS 11.7*  --   --   --  17.7*  --   --   HGB 8.8*   < > 7.9* 7.6* 7.8* 7.1* 6.5*  HCT 24.0*   < > 21.4* 20.8* 21.3* 20.0* 18.8*  MCV 74.5*   < > 74.8* 75.6* 75.5* 77.5* 79.7*  PLT 440*   < > 377 321 365 301 296   < > = values in this interval not displayed.    Cardiac Enzymes: No results for input(s): CKTOTAL, CKMB, CKMBINDEX, TROPONINI in the last 168 hours.  BNP: Invalid input(s): POCBNP  CBG: Recent Labs  Lab 06/10/20 0019 06/11/20 0718 06/11/20 2144  GLUCAP 121* 90 100*    Microbiology: Results for orders placed or performed during the hospital encounter of 06/09/20  Resp Panel by RT-PCR (Flu A&B, Covid) Nasopharyngeal Swab     Status: None   Collection Time: 06/09/20  7:37 PM   Specimen: Nasopharyngeal Swab; Nasopharyngeal(NP) swabs in vial transport medium  Result Value Ref Range Status    SARS Coronavirus 2 by RT PCR NEGATIVE NEGATIVE Final    Comment: (NOTE) SARS-CoV-2 target nucleic acids are NOT DETECTED.  The SARS-CoV-2 RNA is generally detectable in upper respiratory specimens during the acute phase of infection. The lowest concentration of SARS-CoV-2 viral copies this assay can detect is 138 copies/mL. A negative result does not preclude SARS-Cov-2 infection and should not be used as the sole basis for treatment or other patient management decisions. A negative result may occur with  improper specimen collection/handling, submission of specimen other than nasopharyngeal swab, presence of viral mutation(s) within the areas targeted by this assay, and inadequate number of viral copies(<138 copies/mL). A negative result must be combined with clinical observations, patient history, and epidemiological information. The expected result is Negative.  Fact Sheet for Patients:  EntrepreneurPulse.com.au  Fact Sheet for Healthcare Providers:  IncredibleEmployment.be  This test is no t yet approved or cleared by the [place] FDA and  has been authorized for detection and/or diagnosis of SARS-CoV-2 by FDA under an Emergency Use Authorization (EUA). This EUA will remain  in effect (meaning this test can be used) for the duration of the COVID-19 declaration under Section 564(b)(1) of the Act, 21 U.S.C.section 360bbb-3(b)(1), unless the authorization is terminated  or revoked sooner.       Influenza A by PCR NEGATIVE NEGATIVE Final   Influenza B by PCR NEGATIVE NEGATIVE Final    Comment: (  NOTE) The Xpert Xpress SARS-CoV-2/FLU/RSV plus assay is intended as an aid in the diagnosis of influenza from Nasopharyngeal swab specimens and should not be used as a sole basis for treatment. Nasal washings and aspirates are unacceptable for Xpert Xpress SARS-CoV-2/FLU/RSV testing.  Fact Sheet for  Patients: EntrepreneurPulse.com.au  Fact Sheet for Healthcare Providers: IncredibleEmployment.be  This test is not yet approved or cleared by the [place] FDA and has been authorized for detection and/or diagnosis of SARS-CoV-2 by FDA under an Emergency Use Authorization (EUA). This EUA will remain in effect (meaning this test can be used) for the duration of the COVID-19 declaration under Section 564(b)(1) of the Act, 21 U.S.C. section 360bbb-3(b)(1), unless the authorization is terminated or revoked.  Performed at [facility], [address] [place], [place] [address]   MRSA PCR Screening     Status: None   Collection Time: 06/10/20 12:24 AM   Specimen: Nasopharyngeal  Result Value Ref Range Status   MRSA by PCR NEGATIVE NEGATIVE Final    Comment:        The GeneXpert MRSA Assay (FDA approved for NASAL specimens only), is one component of a comprehensive MRSA colonization surveillance program. It is not intended to diagnose MRSA infection nor to guide or monitor treatment for MRSA infections. Performed at [facility], [address] [place], [place] [address]   Urine Culture     Status: None   Collection Time: 06/10/20  6:06 AM   Specimen: Urine, Random  Result Value Ref Range Status   Specimen Description   Final    URINE, RANDOM Performed at [facility], [address], [place], [place] [address]    Special Requests   Final    NONE Performed at [facility], [address] [place], [place] [address]    Culture   Final    NO GROWTH Performed at [facility], [place] [address] [place], [place] [address]    Report Status 06/12/2020 FINAL  Final  CULTURE, BLOOD (ROUTINE X 2) w Reflex to ID Panel     Status: None (Preliminary result)   Collection Time: 06/11/20 10:54 AM   Specimen: BLOOD RIGHT HAND  Result Value Ref Range Status   Specimen Description BLOOD RIGHT HAND  Final    Special Requests   Final    BOTTLES DRAWN AEROBIC AND ANAEROBIC Blood Culture results may not be optimal due to an excessive volume of blood received in culture bottles   Culture   Final    NO GROWTH 2 DAYS Performed at [facility], [address] [place], [place] [address]    Report Status PENDING  Incomplete  CULTURE, BLOOD (ROUTINE X 2) w Reflex to ID Panel     Status: None (Preliminary result)   Collection Time: 06/11/20 11:04 AM   Specimen: BLOOD RIGHT ARM  Result Value Ref Range Status   Specimen Description BLOOD RIGHT ARM  Final   Special Requests   Final    BOTTLES DRAWN AEROBIC AND ANAEROBIC Blood Culture results may not be optimal due to an excessive volume of blood received in culture bottles   Culture   Final    NO GROWTH 2 DAYS Performed at [facility], [address] [place],  [address]    Report Status PENDING  Incomplete    Coagulation Studies: No results for input(s): LABPROT, INR in the last 72 hours.  Urinalysis: No results for input(s): COLORURINE, LABSPEC, [place], [place], [place], [place],  KETONESUR, PROTEINUR, UROBILINOGEN, NITRITE, LEUKOCYTESUR in the last 72 hours.  Invalid input(s): APPERANCEUR    Imaging: No results found.   Medications:   . azithromycin Stopped (06/12/20 1719)  . cefTRIAXone (ROCEPHIN)  IV Stopped (06/12/20 1556)  . sodium chloride (hypertonic) 20 mL/hr at 06/13/20 1328   . Chlorhexidine Gluconate Cloth  6 each Topical Q0600  . heparin  5,000 Units Subcutaneous Q8H  . mycophenolate  500 mg Oral BID  . sevelamer carbonate  800 mg Oral TID WC    Assessment/ Plan:     Active Problems:   Hyponatremia  55 y.o. female with hypertension, lupus nephritis, chronic kidney disease stage V   admitted on 06/09/2020 for Hyponatremia [E87.1] Seizure ([place]) [R56.9] Acute renal failure, unspecified acute renal failure type ([place]) [N17.9]  #1 hyponatremia possibly due to overdiuresis.  She initially  had seizures on admission.  She was initiated on hypertonic saline with improvement in sodium levels to 122 today. We will continue low-dose hypertonic saline until sodium improves to over 125.  #2 acute kidney injury on chronic kidney disease.  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  She was on CellCept and Plaquenil.  #3 hypertension presently stable.  Not on any antihypertensive medications at this time.  #4 secondary hyperparathyroidism we will continue sevelamer.  Spoke to the family at bedside in detail and explained to them of the renal situation and answered all their questions.    LOS: 4 Miia Blanks 4/9/20221:29 PM

## 2020-06-14 DIAGNOSIS — N179 Acute kidney failure, unspecified: Secondary | ICD-10-CM | POA: Diagnosis not present

## 2020-06-14 DIAGNOSIS — E871 Hypo-osmolality and hyponatremia: Secondary | ICD-10-CM | POA: Diagnosis not present

## 2020-06-14 DIAGNOSIS — R569 Unspecified convulsions: Secondary | ICD-10-CM | POA: Diagnosis not present

## 2020-06-14 LAB — CBC
HCT: 23.5 % — ABNORMAL LOW (ref 36.0–46.0)
Hemoglobin: 8.5 g/dL — ABNORMAL LOW (ref 12.0–15.0)
MCH: 28.5 pg (ref 26.0–34.0)
MCHC: 36.2 g/dL — ABNORMAL HIGH (ref 30.0–36.0)
MCV: 78.9 fL — ABNORMAL LOW (ref 80.0–100.0)
Platelets: 319 10*3/uL (ref 150–400)
RBC: 2.98 MIL/uL — ABNORMAL LOW (ref 3.87–5.11)
RDW: 12.5 % (ref 11.5–15.5)
WBC: 11.5 10*3/uL — ABNORMAL HIGH (ref 4.0–10.5)
nRBC: 0 % (ref 0.0–0.2)

## 2020-06-14 LAB — BASIC METABOLIC PANEL
Anion gap: 15 (ref 5–15)
BUN: 127 mg/dL — ABNORMAL HIGH (ref 6–20)
CO2: 13 mmol/L — ABNORMAL LOW (ref 22–32)
Calcium: 6.1 mg/dL — CL (ref 8.9–10.3)
Chloride: 96 mmol/L — ABNORMAL LOW (ref 98–111)
Creatinine, Ser: 6.05 mg/dL — ABNORMAL HIGH (ref 0.44–1.00)
GFR, Estimated: 8 mL/min — ABNORMAL LOW (ref >60)
Glucose, Bld: 86 mg/dL (ref 70–99)
Potassium: 5.2 mmol/L — ABNORMAL HIGH (ref 3.5–5.1)
Sodium: 124 mmol/L — ABNORMAL LOW (ref 135–145)

## 2020-06-14 LAB — SODIUM
Sodium: 125 mmol/L — ABNORMAL LOW (ref 135–145)
Sodium: 127 mmol/L — ABNORMAL LOW (ref 135–145)
Sodium: 125 mmol/L — ABNORMAL LOW (ref 135–145)
Sodium: 126 mmol/L — ABNORMAL LOW (ref 135–145)

## 2020-06-14 LAB — MAGNESIUM: Magnesium: 1.9 mg/dL (ref 1.7–2.4)

## 2020-06-14 LAB — PARATHYROID HORMONE, INTACT (NO CA)

## 2020-06-14 LAB — PHOSPHORUS: Phosphorus: 10.2 mg/dL — ABNORMAL HIGH (ref 2.5–4.6)

## 2020-06-14 MED ORDER — SODIUM BICARBONATE 650 MG PO TABS
650.0000 mg | ORAL_TABLET | Freq: Three times a day (TID) | ORAL | Status: DC
  Administered 2020-06-14 – 2020-06-22 (×21): 650 mg via ORAL
  Filled 2020-06-14 (×21): qty 1

## 2020-06-14 MED ORDER — SODIUM CHLORIDE 1 G PO TABS
1.0000 g | ORAL_TABLET | Freq: Two times a day (BID) | ORAL | Status: DC
  Administered 2020-06-14 – 2020-06-22 (×14): 1 g via ORAL
  Filled 2020-06-14 (×17): qty 1

## 2020-06-14 MED ORDER — BISACODYL 10 MG RE SUPP
10.0000 mg | Freq: Once | RECTAL | Status: AC
  Administered 2020-06-14: 10 mg via RECTAL
  Filled 2020-06-14: qty 1

## 2020-06-14 NOTE — Progress Notes (Signed)
PHARMACY CONSULT NOTE - FOLLOW UP  Pharmacy Consult for Electrolyte Monitoring and Replacement   Recent Labs: Potassium (mmol/L)  Date Value  06/14/2020 5.2 (H)   Magnesium (mg/dL)  Date Value  06/14/2020 1.9   Calcium (mg/dL)  Date Value  06/14/2020 6.1 (LL)   Albumin (g/dL)  Date Value  06/13/2020 1.8 (L)   Phosphorus (mg/dL)  Date Value  06/14/2020 10.2 (H)   Sodium (mmol/L)  Date Value  06/14/2020 125 (L)   Corrected Ca 7.9 mg/dL  Assessment: 55 yo F presented with c/o seizure. Pt Na was found to be 105. Pharmacy has been consulted to monitor Na levels while on 3% NaCl infusion. 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   3% NaCl infusion has stopped and patient is now on NaCl tablets.  Potassium and phosphorus elevated will defer treatment to nephro.   Goal of Therapy:  --Increase in sodium of 8 mEq in 24 hr period --If Na increases > 4 mEq/L in 2 hours or > 6 mEq/L in 4 hours call RN to stop infusion and notify MD  --All other electrolytes WNL  Plan:  F/u with sodium level every 4 hours. No electrolyte replacement needed at this time.  F/u other electrolytes via BMP with AM labs   Paulina Fusi, PharmD, BCPS 06/14/2020 7:04 PM

## 2020-06-14 NOTE — Progress Notes (Signed)
PHARMACY CONSULT NOTE - FOLLOW UP  Pharmacy Consult for Electrolyte Monitoring and Replacement   Recent Labs: Potassium (mmol/L)  Date Value  06/14/2020 5.2 (H)   Magnesium (mg/dL)  Date Value  06/14/2020 1.9   Calcium (mg/dL)  Date Value  06/14/2020 6.1 (LL)   Albumin (g/dL)  Date Value  06/13/2020 1.8 (L)   Phosphorus (mg/dL)  Date Value  06/14/2020 10.2 (H)   Sodium (mmol/L)  Date Value  06/14/2020 124 (L)   Corrected Ca 7.9 mg/dL  Assessment: 55 yo F presented with c/o seizure. Pt Na was found to be 105. Pharmacy has been consulted to monitor Na levels while on 3% NaCl infusion. 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   Potassium and phosphorus elevated will defer treatment to nephro.   Goal of Therapy:  --Increase in sodium of 8 mEq in 24 hr period --If Na increases > 4 mEq/L in 2 hours or > 6 mEq/L in 4 hours call RN to stop infusion and notify MD  --All other electrolytes WNL  Plan:  F/u with sodium level every 4 hours. No electrolyte replacement needed at this time.  F/u other electrolytes via BMP with AM labs   Eleonore Chiquito, PharmD Clinical Pharmacist  06/14/2020   9:00 AM

## 2020-06-14 NOTE — Progress Notes (Signed)
PHARMACY CONSULT NOTE - FOLLOW UP  Pharmacy Consult for Electrolyte Monitoring and Replacement   Recent Labs: Potassium (mmol/L)  Date Value  06/13/2020 3.9   Magnesium (mg/dL)  Date Value  06/13/2020 1.9   Calcium (mg/dL)  Date Value  06/13/2020 6.0 (LL)   Albumin (g/dL)  Date Value  06/13/2020 1.8 (L)   Phosphorus (mg/dL)  Date Value  06/13/2020 10.0 (H)   Sodium (mmol/L)  Date Value  06/14/2020 125 (L)   Assessment: 55 yo F presented with c/o seizure. Pt Na was found to be 105. Pharmacy has been consulted to monitor Na levels while on 3% NaCl infusion.   Tonight, Na level unchanged from earlier this afternoon (122) with hypertonic saline rate at 41m/hr.  Goal of Therapy:  --Increase in sodium of 8 mEq in 24 hr period --If Na increases > 4 mEq/L in 2 hours or > 6 mEq/L in 4 hours call RN to stop infusion and notify MD  --All other electrolytes WNL  4/9 2330 Na 124 4/10 0335 Na 125, 3% NaCl running at 25 ml/hr  Plan:  F/u with sodium level every 4 hours F/u other electrolytes via BMP with AM labs  NRenda Rolls PharmD, MJohn [facility]4/12/2020 3:58 AM

## 2020-06-14 NOTE — Progress Notes (Signed)
[facility]  PROGRESS NOTE   Subjective:   Patient seen at bedside this morning in the ICU Patient's son and daughter at bedside. Patient denies any chest pain, shortness of breath or orthopnea. He ate better this morning. Has been on hypertonic saline.  Objective:  Vital signs in last 24 hours:  Temp:  [98.2 F (36.8 C)-98.7 F (37.1 C)] 98.7 F (37.1 C) (04/10 1600) Pulse Rate:  [69-94] 82 (04/10 1700) Resp:  [14-30] 17 (04/10 1700) BP: (101-132)/(54-76) 111/63 (04/10 1700) SpO2:  [97 %-100 %] 100 % (04/10 1700)  Weight change:  Filed Weights   06/11/20 0500 06/12/20 0500 06/13/20 0500  Weight: 73.2 kg 75 kg 75.9 kg    Intake/Output: I/O last 3 completed shifts: In: 2855.5 [P.O.:940; I.V.:845.5; Blood:720; IV Piggyback:350] Out: 2150 [Urine:2150]   Intake/Output this shift:  Total I/O In: 150.3 [IV Piggyback:150.3] Out: -   Physical Exam: General:  No acute distress  Head:  Normocephalic, atraumatic. Moist oral mucosal membranes  Eyes:  Anicteric  Neck:  Supple  Lungs:   Clear to auscultation, normal effort  Heart:  S1S2 no rubs  Abdomen:   Soft, nontender, bowel sounds present  Extremities:  peripheral edema.  Neurologic:  Awake, alert, following commands  Skin:  No lesions  Access:     Basic Metabolic Panel: Recent Labs  Lab 06/10/20 0454 06/10/20 0619 06/11/20 0257 06/11/20 0639 06/12/20 0313 06/12/20 9528 06/13/20 0442 06/13/20 4132 06/13/20 2330 06/14/20 0335 06/14/20 0716 06/14/20 1105 06/14/20 1503  NA 108*   < > 109*  110*   < > 114*   < > 120*   < > 124* 125* 124* 127* 125*  K 4.1  --  4.0  --  3.9  --  3.9  --   --   --  5.2*  --   --   CL 72*  --  75*  --  80*  --  89*  --   --   --  96*  --   --   CO2 19*  --  18*  --  17*  --  16*  --   --   --  13*  --   --   GLUCOSE 98  --  100*  --  96  --  89  --   --   --  86  --   --   BUN 104*  --  117*  --  122*  --  117*  --   --   --  127*  --   --   CREATININE 5.82*  --   6.23*  --  5.94*  --  6.02*  --   --   --  6.05*  --   --   CALCIUM 6.8*  --  6.6*  --  6.3*  --  6.0*  --   --   --  6.1*  --   --   MG 1.9  --  2.0  --  1.9  --  1.9  --   --   --  1.9  --   --   PHOS 10.1*  --  10.0*  --  9.9*  --  10.0*  --   --   --  10.2*  --   --    < > = values in this interval not displayed.    Liver Function Tests: Recent Labs  Lab 06/09/20 1747 06/11/20 0257 06/11/20 1054 06/12/20 4401 06/13/20 0272  AST 28  --  24  --   --   ALT 12  --  14  --   --   ALKPHOS 87  --  82  --   --   BILITOT 0.6  --  0.6  --   --   PROT 6.5  --  5.7*  --   --   ALBUMIN 2.3* 2.0* 2.1* 2.0* 1.8*   No results for input(s): LIPASE, AMYLASE in the last 168 hours. No results for input(s): AMMONIA in the last 168 hours.  CBC: Recent Labs  Lab 06/09/20 1747 06/09/20 1937 06/11/20 0257 06/11/20 1054 06/12/20 0313 06/13/20 0442 06/13/20 1608 06/14/20 0716  WBC 12.7*   < > 22.9* 19.1* 17.1* 11.3*  --  11.5*  NEUTROABS 11.7*  --   --  17.7*  --   --   --   --   HGB 8.8*   < > 7.6* 7.8* 7.1* 6.5* 7.9* 8.5*  HCT 24.0*   < > 20.8* 21.3* 20.0* 18.8* 22.9* 23.5*  MCV 74.5*   < > 75.6* 75.5* 77.5* 79.7*  --  78.9*  PLT 440*   < > 321 365 301 296  --  319   < > = values in this interval not displayed.    Cardiac Enzymes: No results for input(s): CKTOTAL, CKMB, CKMBINDEX, TROPONINI in the last 168 hours.  BNP: Invalid input(s): POCBNP  CBG: Recent Labs  Lab 06/10/20 0019 06/11/20 0718 06/11/20 2144  GLUCAP 121* 90 100*    Microbiology: Results for orders placed or performed during the hospital encounter of 06/09/20  Resp Panel by RT-PCR (Flu A&B, Covid) Nasopharyngeal Swab     Status: None   Collection Time: 06/09/20  7:37 PM   Specimen: Nasopharyngeal Swab; Nasopharyngeal(NP) swabs in vial transport medium  Result Value Ref Range Status   SARS Coronavirus 2 by RT PCR NEGATIVE NEGATIVE Final    Comment: (NOTE) SARS-CoV-2 target nucleic acids are NOT  DETECTED.  The SARS-CoV-2 RNA is generally detectable in upper respiratory specimens during the acute phase of infection. The lowest concentration of SARS-CoV-2 viral copies this assay can detect is 138 copies/mL. A negative result does not preclude SARS-Cov-2 infection and should not be used as the sole basis for treatment or other patient management decisions. A negative result may occur with  improper specimen collection/handling, submission of specimen other than nasopharyngeal swab, presence of viral mutation(s) within the areas targeted by this assay, and inadequate number of viral copies(<138 copies/mL). A negative result must be combined with clinical observations, patient history, and epidemiological information. The expected result is Negative.  Fact Sheet for Patients:  EntrepreneurPulse.com.au  Fact Sheet for Healthcare Providers:  IncredibleEmployment.be  This test is no t yet approved or cleared by the [place] FDA and  has been authorized for detection and/or diagnosis of SARS-CoV-2 by FDA under an Emergency Use Authorization (EUA). This EUA will remain  in effect (meaning this test can be used) for the duration of the COVID-19 declaration under Section 564(b)(1) of the Act, 21 U.S.C.section 360bbb-3(b)(1), unless the authorization is terminated  or revoked sooner.       Influenza A by PCR NEGATIVE NEGATIVE Final   Influenza B by PCR NEGATIVE NEGATIVE Final    Comment: (NOTE) The Xpert Xpress SARS-CoV-2/FLU/RSV plus assay is intended as an aid in the diagnosis of influenza from Nasopharyngeal swab specimens and should not be used as a sole basis for treatment. Nasal washings and aspirates are  unacceptable for Xpert Xpress SARS-CoV-2/FLU/RSV testing.  Fact Sheet for Patients: EntrepreneurPulse.com.au  Fact Sheet for Healthcare Providers: IncredibleEmployment.be  This test is not yet  approved or cleared by the [place] FDA and has been authorized for detection and/or diagnosis of SARS-CoV-2 by FDA under an Emergency Use Authorization (EUA). This EUA will remain in effect (meaning this test can be used) for the duration of the COVID-19 declaration under Section 564(b)(1) of the Act, 21 U.S.C. section 360bbb-3(b)(1), unless the authorization is terminated or revoked.  Performed at [facility], [address] [place], [place] [address]   MRSA PCR Screening     Status: None   Collection Time: 06/10/20 12:24 AM   Specimen: Nasopharyngeal  Result Value Ref Range Status   MRSA by PCR NEGATIVE NEGATIVE Final    Comment:        The GeneXpert MRSA Assay (FDA approved for NASAL specimens only), is one component of a comprehensive MRSA colonization surveillance program. It is not intended to diagnose MRSA infection nor to guide or monitor treatment for MRSA infections. Performed at [facility], [address], [place], [place] [address]   Urine Culture     Status: None   Collection Time: 06/10/20  6:06 AM   Specimen: Urine, Random  Result Value Ref Range Status   Specimen Description   Final    URINE, RANDOM Performed at [facility], [address] [place], [place] [address]    Special Requests   Final    NONE Performed at [facility], [address] [place], [place] [address]    Culture   Final    NO GROWTH Performed at [facility], [place] [address], [place], [place] [address]    Report Status 06/12/2020 FINAL  Final  CULTURE, BLOOD (ROUTINE X 2) w Reflex to ID Panel     Status: None (Preliminary result)   Collection Time: 06/11/20 10:54 AM   Specimen: BLOOD RIGHT HAND  Result Value Ref Range Status   Specimen Description BLOOD RIGHT HAND  Final   Special Requests   Final    BOTTLES DRAWN AEROBIC AND ANAEROBIC Blood Culture results may not be optimal due to an excessive volume of blood received in  culture bottles   Culture   Final    NO GROWTH 3 DAYS Performed at [facility], [address], [place], [place] [address]    Report Status PENDING  Incomplete  CULTURE, BLOOD (ROUTINE X 2) w Reflex to ID Panel     Status: None (Preliminary result)   Collection Time: 06/11/20 11:04 AM   Specimen: BLOOD RIGHT ARM  Result Value Ref Range Status   Specimen Description BLOOD RIGHT ARM  Final   Special Requests   Final    BOTTLES DRAWN AEROBIC AND ANAEROBIC Blood Culture results may not be optimal due to an excessive volume of blood received in culture bottles   Culture   Final    NO GROWTH 3 DAYS Performed at [facility], [address] [place], [place] [address]    Report Status PENDING  Incomplete    Coagulation Studies: No results for input(s): LABPROT, INR in the last 72 hours.  Urinalysis: No results for input(s): COLORURINE, LABSPEC, PHURINE, GLUCOSEU, HGBUR, BILIRUBINUR, KETONESUR, PROTEINUR, UROBILINOGEN, NITRITE, LEUKOCYTESUR in the last 72 hours.  Invalid input(s): APPERANCEUR    Imaging: No results found.   Medications:   . azithromycin 250 mL/hr at 06/14/20 1713  . cefTRIAXone (ROCEPHIN)  IV Stopped (06/14/20 1639)   . Chlorhexidine Gluconate Cloth  6 each Topical Q0600  . heparin  5,000 Units Subcutaneous Q8H  . metoCLOPramide (REGLAN) injection  5 mg Intravenous Q8H  . mycophenolate  500 mg Oral BID  . pantoprazole (PROTONIX) IV  40 mg Intravenous QHS  . sevelamer carbonate  800 mg Oral TID WC  . sodium bicarbonate  650 mg Oral TID  . sodium chloride  1 g Oral BID WC    Assessment/ Plan:     Active Problems:   Hyponatremia  55 y.o.femalewith hypertension, lupus nephritis, chronic kidney disease stage Vadmitted on 4/5/2022for: Hyponatremia [E87.1] Seizure ([place]) [R56.9] Acute renal failure, unspecified acute renal failure type ([place]) [N17.9]  #1 hyponatremia possibly due to overdiuresis.  She initially had seizures on  admission.  She was initiated on hypertonic saline with improvement in sodium levels to 127 today. We will stop hypertonic saline and start her on salt tablets.  We will continue the fluid restriction as advised previously to 1 L a day.  #2 acute kidney injury on chronic kidney disease.  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  She was on CellCept and Plaquenil.  Patient has elevated ESR and suggested to patient's daughter regarding the possibility of high-dose steroids.  But she refused and would like to follow-up with her primary nephrologist.  She has profound metabolic acidosis possibly secondary to renal disease and I would like to start her on sodium bicarbonate.  #3 hypertension presently stable.  Not on any antihypertensive medications at this time.  #4 secondary hyperparathyroidism we will continue sevelamer.  Spoke to the family at bedside in detail and explained to them of the renal situation and answered all their questions.     LOS: [place], [facility] 4/10/20226:20 PM

## 2020-06-14 NOTE — Progress Notes (Signed)
Bladder scanned patient. 193 ML, per patient she does not feel the urge to void or have a bowel movement.

## 2020-06-14 NOTE — Progress Notes (Signed)
Dr. Duwayne Heck notified of patient sodium, potassium, calcium levels. Per MD placed orders for pharmacy consult.

## 2020-06-14 NOTE — Progress Notes (Signed)
NAME:  Carol Hicks, MRN:  [id number], DOB:  21-Nov-1965, LOS: 5 ADMISSION DATE:  06/09/2020,  INITIAL CONSULTATION DATE: 06/09/2020  REFERRING MD: Dr. Tamala Julian, CHIEF COMPLAINT: Seizures   History of Present Illness:  This is a 55 yo female who presented to [facility] ER on 04/5 from home with seizure activity.  Upon review of records pt has a hx of lupus and is currently being worked up for possible lupus nephritis at [facility], and recently started CellCept 250 mg 2 capsules bid on 03/18.  There is concern pt may be close to ESRD and dialysis and/or kidney transplant.  During a previous hospitalization she was started on bumex 2 mg bid and sodium bicarb.  She was recently diagnosed with a viral URI with recommendations of supportive care and close outpatient follow-up on 03/25.  Pts daughter reported the pt has not been "acting the same" for about 2 weeks.  She also reported the pt went from a lying position to a seated position she had a seizure today that lasted for about 2-3 minutes. During the seizure the pts eyes rolled back and Hicks her arms were shaking.  Following the seizure the pt appeared very sleepy for about 5-10 minutes.  EMS reported the pts vital signs were stable when they arrived.  ED Course Upon arrival to the ER pts daughter reported the pt appeared at her baseline, but the pt had no recollection of what happened prior to arrival in the ER.  Pt reported she has had emesis when taking her sodium bicarb tablet and loose stools over the past 24-36 hrs.  She also reported insomnia, on average only able to sleep 1-2 hrs per night over the past 2 weeks.  Lab results revealed Na+ 105, chloride 69, CO2 18, glucose 150, BUN 111, creatinine 6.24, calcium 6.9, anion gap 18, albumin 2.3, troponin 20, wbc 12.7, and hgb 8.8, platelets 440.  CT Head negative for acute intracranial findings or mass lesions.  She received 500 ml LR bolus.  ER physician contacted on call nephrologist Dr. Candiss Norse and he recommended the  following: 3% saline 50 ml bolus x1 to raise Na+ above 110 with slow correction thereafter (goal of ~8 Meq correction in 24hrs).  Received 100 ml bolus of hypertonic saline per ER physician.  Pertinent  Medical History  COVID-19 dx Oct 2021 (received monoclonal antibody infusion) HTN  Stage IV CKD  Nephrotic Syndrome  Lupus  Pelvic Lymphadenopathy  Adiposity    Cultures:  06/11/20: Blood culture x2>> 06/11/20: Sputum>> 06/11/20: Strep pneumo urinary antigen>> negative 06/11/20: Legionella urinary antigen>> 06/11/20: Mycoplasma pneumoniae >> 06/11/20: Pneumocystis PCR>> 06/11/20: Urine>>  Antimicrobials:  Azithromycin 4/7>> Ceftriaxone 4/7>>  Significant Hospital Events: Including procedures, antibiotic start and stop dates in addition to other pertinent events   . Pt admitted to ICU with hyponatremia Na 105 received 3% saline 50 ml bolus  06/10/20: Remained on 3% Hypertonic saline, latest Na 112 this afternoon (Na 105 upon presentation @ 18:00 yesterday); per discussion with Nephrology will hold 3% Hypertonic saline and follow correction with PO intake 06/11/20: Serum Na remains low, placed on NS overnight. Placing back on 3% Saline by Nephrology, increasing WBC 22.9, CXR with left basilar opacity concerning for atelectasis vs. PNA, placed on empiric Azithromycin + Rocephin, cultures obtained 06/11/20: Serum Na remains below 120 (115), Hypertonic saline increased by Nephrology, Strep pneumo urinary antigen negative, all other cultures still pending.  06/13/2020: Patient noted to have significant anemia, consent obtained for transfusion.  Sodium  122, continue 3% saline at reduced dose of 20 mL/h. 06/14/2020: Transition from IV 3% hypertonic saline to p.o. saline tablets,  Interim History / Subjective:  -Serum Na remains low, on upward trend latest check this morning 122 -3% Hypertonic saline decreased -Strep pneumo urinary antigen negative, pro calcitonin on downward trend but still 3.56, continue  empiric PNA coverage with Azithromycin & Rocephin -Afebrile, hemodynamically stable, on room air, NO vasopressors -Urine output 1,200 ml over past 24 hrs (+1514 ml since admit) -Creatinine has plateaued, 6.05 today -Pt resting comfortably in bed, denies all complaints, oriented to person and place -Updated  daughter at bedside -8.5 and 23.5 H&H, after blood transfusion  Objective   Blood pressure 115/61, pulse 78, temperature 98.7 F (37.1 C), temperature source Oral, resp. rate 16, height '5\' 2"'$  (1.575 m), weight 75.9 kg, SpO2 99 %.        Intake/Output Summary (Last 24 hours) at 06/14/2020 0815 Last data filed at 06/14/2020 0700 Gross per 24 hour  Intake 2368.75 ml  Output 950 ml  Net 1418.75 ml   Filed Weights   06/11/20 0500 06/12/20 0500 06/13/20 0500  Weight: 73.2 kg 75 kg 75.9 kg    Examination: General: Acutely ill-appearing female, pale, on room air, no acute distress HENT: Atraumatic, normal, neck supple, no JVD Lungs: Clear to auscultation bilaterally, no wheezing or rales noted, even, nonlabored, normal effort Cardiovascular: Regular rate and rhythm, S1-S2, no murmurs, rubs, gallops, 2+ radial pulses, 1+ distal pulses   Abdomen: Soft, nontender, nondistended, no guarding or rebound tenderness, bowel sounds positive x4 Extremities: Normal bulk and tone, no deformities, no edema Neuro: Sleeping, arouses easily to voice, oriented to self and place, follows commands, no focal deficits, speech clear, right pupil 2 mm reactive, left prosthetic eye  Labs/imaging that I havepersonally reviewed  (right click and "Reselect all SmartList Selections" daily)  Lab results reviewed 04/9: H&H 6.5/18.8, sodium 122, sed rate 106, 117, creatinine 6.02 remainder of laboratory data as below reviewed. CXR 06/11/20>>Nonspecific left basilar airspace opacity obscures the left hemidiaphragm. Differential considerations include atelectasis, layering pleural effusion and/or infiltrate. CT Head  06/09/2020: negative for acute intracranial findings or mass lesions  Resolved Hospital Problem list   N/A  Assessment & Plan:   Severe hyponatremia and hypochloremia likely diuretic induced Anion gap metabolic acidosis  Chronic kidney disease stage V Hx: lupus nephritis  -Continuous telemetry monitoring  -Serial Na+ monitoring q4h -Nephrology following, appreciate input -Serum sodium has corrected to 127, changed to p.o. salt tablets -Seizure precautions  -Trend BMP  -Replace electrolytes as indicated  -Monitor UOP -Avoid nephrotoxic medications  -Continue to hold outpatient bumex   Mildly elevated troponin likely secondary to demand ischemia in the setting of severe hyponatremia  -Trend troponin (20 ~ 16)  Anemia without obvious acute blood loss  -Trend CBC  -Will resume outpatient ferrous sulfate  -Monitor for s/sx of bleeding -Transfusing for hemoglobin less than 7  Leukocytosis Concern for Left Basilar Pneumonia on CXR 06/11/20 -Monitor fever curve  -Trend WBC's and Procalcitonin -Procalcitonin on downward trend however still 3.56 -Follow cultures as above (strep pneumo is negative, all other cultures still pending) -Continue Azithromycin + Rocephin pending cultures and sensitivities  Lupus -Resume outpatient cellcept  -Sed rate and CRP elevated consider pulse steroids -Query lupus flare  Hyperglycemia  -CBG's ac/hs -SSI  -Follow ICU Hypo/hyperglycemia protocol  Seizure activity suspected secondary to severe hyponatremia~CT Head 04/5 negative  Acute encephalopathy  -Seizure precautions  -PRN ativan for seizure activity  -  Frequent reorientation   Best practice (right click and "Reselect all SmartList Selections" daily)  Diet:  Oral Pain/Anxiety/Delirium protocol (if indicated): No VAP protocol (if indicated): No DVT prophylaxis: Subcutaneous Heparin GI prophylaxis: H2B Glucose control:  SSI Yes Central venous access:  N/A Arterial line:  N/A Foley:   N/A Mobility:  bed rest  PT consulted: N/A Last date of multidisciplinary goals of care discussion [06/15/20] Code Status:  full code Disposition: ICU  Updated pt'sdaughter at bedside 06/14/20.  Answered all of patient's questions.  Labs   CBC: Recent Labs  Lab 06/09/20 1747 06/09/20 1937 06/11/20 0257 06/11/20 1054 06/12/20 0313 06/13/20 0442 06/13/20 1608 06/14/20 0716  WBC 12.7*   < > 22.9* 19.1* 17.1* 11.3*  --  11.5*  NEUTROABS 11.7*  --   --  17.7*  --   --   --   --   HGB 8.8*   < > 7.6* 7.8* 7.1* 6.5* 7.9* 8.5*  HCT 24.0*   < > 20.8* 21.3* 20.0* 18.8* 22.9* 23.5*  MCV 74.5*   < > 75.6* 75.5* 77.5* 79.7*  --  78.9*  PLT 440*   < > 321 365 301 296  --  319   < > = values in this interval not displayed.    Basic Metabolic Panel: Recent Labs  Lab 06/09/20 1747 06/09/20 1937 06/09/20 2243 06/10/20 0454 06/10/20 [id number] 06/11/20 0257 06/11/20 [id number] 06/12/20 0313 06/12/20 [id number] 06/13/20 0442 06/13/20 [id number] 06/13/20 1052 06/13/20 1608 06/13/20 1911 06/13/20 2330 06/14/20 0335  NA 105*  --    < > 108*   < > 109*  110*   < > 114*   < > 120*   < > 122* 122* 122* 124* 125*  K 4.4  --   --  4.1  --  4.0  --  3.9  --  3.9  --   --   --   --   --   --   CL 69*  --   --  72*  --  75*  --  80*  --  89*  --   --   --   --   --   --   CO2 18*  --   --  19*  --  18*  --  17*  --  16*  --   --   --   --   --   --   GLUCOSE 150*  --   --  98  --  100*  --  96  --  89  --   --   --   --   --   --   BUN 111*  --   --  104*  --  117*  --  122*  --  117*  --   --   --   --   --   --   CREATININE 6.24* 5.88*  --  5.82*  --  6.23*  --  5.94*  --  6.02*  --   --   --   --   --   --   CALCIUM 6.9*  --   --  6.8*  --  6.6*  --  6.3*  --  6.0*  --   --   --   --   --   --   MG  --   --   --  1.9  --  2.0  --  1.9  --  1.9  --   --   --   --   --   --  PHOS  --   --   --  10.1*  --  10.0*  --  9.9*  --  10.0*  --   --   --   --   --   --    < > = values in this interval not displayed.    GFR: Estimated Creatinine Clearance: 10.3 mL/min (A) (by C-G formula based on SCr of 6.02 mg/dL (H)). Recent Labs  Lab 06/09/20 1747 06/09/20 1937 06/11/20 1054 06/12/20 0313 06/13/20 0442 06/14/20 0716  PROCALCITON  --   --  4.00 3.70 3.56  --   WBC 12.7*   < > 19.1* 17.1* 11.3* 11.5*  LATICACIDVEN 1.9  --   --   --   --   --    < > = values in this interval not displayed.    Liver Function Tests: Recent Labs  Lab 06/09/20 1747 06/11/20 0257 06/11/20 1054 06/12/20 0313 06/13/20 0442  AST 28  --  24  --   --   ALT 12  --  14  --   --   ALKPHOS 87  --  82  --   --   BILITOT 0.6  --  0.6  --   --   PROT 6.5  --  5.7*  --   --   ALBUMIN 2.3* 2.0* 2.1* 2.0* 1.8*   CBG: Recent Labs  Lab 06/10/20 0019 06/11/20 0718 06/11/20 2144  GLUCAP 121* 90 100*    Review of Systems:   A 10 point review of systems was performed and it is as noted above otherwise negative.  Allergies No Known Allergies   Scheduled Meds: . Chlorhexidine Gluconate Cloth  6 each Topical Q0600  . heparin  5,000 Units Subcutaneous Q8H  . metoCLOPramide (REGLAN) injection  5 mg Intravenous Q8H  . mycophenolate  500 mg Oral BID  . pantoprazole (PROTONIX) IV  40 mg Intravenous QHS  . sevelamer carbonate  800 mg Oral TID WC   Continuous Infusions: . azithromycin Stopped (06/13/20 1707)  . cefTRIAXone (ROCEPHIN)  IV Stopped (06/13/20 1627)  . sodium chloride (hypertonic) 25 mL/hr at 06/14/20 0700   PRN Meds:.acetaminophen, docusate sodium, LORazepam, ondansetron (ZOFRAN) IV, polyethylene glycol   Level 3 follow-up    Patient remains stable.  Sodium now 127.  Transitioning to stepdown level care.  May transfer to [facility] in the morning if remains stable.   Renold Don, MD  PCCM   *This note was dictated using voice recognition software/Dragon.  Despite best efforts to proofread, errors can occur which can change the meaning.  Any change was purely  unintentional.

## 2020-06-14 NOTE — Progress Notes (Signed)
Stopped 3% Saline, miralax given and physical therapy ordered. Pharmacy to consult nephrology regarding patients potassium of 5.2.

## 2020-06-15 ENCOUNTER — Encounter: Payer: Self-pay | Admitting: Internal Medicine

## 2020-06-15 DIAGNOSIS — N179 Acute kidney failure, unspecified: Secondary | ICD-10-CM | POA: Diagnosis not present

## 2020-06-15 LAB — CBC
HCT: 21.2 % — ABNORMAL LOW (ref 36.0–46.0)
Hemoglobin: 7.8 g/dL — ABNORMAL LOW (ref 12.0–15.0)
MCH: 28.7 pg (ref 26.0–34.0)
MCHC: 36.8 g/dL — ABNORMAL HIGH (ref 30.0–36.0)
MCV: 77.9 fL — ABNORMAL LOW (ref 80.0–100.0)
Platelets: 290 10*3/uL (ref 150–400)
RBC: 2.72 MIL/uL — ABNORMAL LOW (ref 3.87–5.11)
RDW: 12.6 % (ref 11.5–15.5)
WBC: 8.8 10*3/uL (ref 4.0–10.5)
nRBC: 0 % (ref 0.0–0.2)

## 2020-06-15 LAB — BASIC METABOLIC PANEL
Anion gap: 15 (ref 5–15)
BUN: 123 mg/dL — ABNORMAL HIGH (ref 6–20)
CO2: 15 mmol/L — ABNORMAL LOW (ref 22–32)
Calcium: 6 mg/dL — CL (ref 8.9–10.3)
Chloride: 96 mmol/L — ABNORMAL LOW (ref 98–111)
Creatinine, Ser: 5.64 mg/dL — ABNORMAL HIGH (ref 0.44–1.00)
GFR, Estimated: 8 mL/min — ABNORMAL LOW (ref >60)
Glucose, Bld: 93 mg/dL (ref 70–99)
Potassium: 4.6 mmol/L (ref 3.5–5.1)
Sodium: 126 mmol/L — ABNORMAL LOW (ref 135–145)

## 2020-06-15 LAB — SODIUM
Sodium: 125 mmol/L — ABNORMAL LOW (ref 135–145)
Sodium: 126 mmol/L — ABNORMAL LOW (ref 135–145)
Sodium: 126 mmol/L — ABNORMAL LOW (ref 135–145)
Sodium: 127 mmol/L — ABNORMAL LOW (ref 135–145)
Sodium: 128 mmol/L — ABNORMAL LOW (ref 135–145)

## 2020-06-15 LAB — MYCOPLASMA PNEUMONIAE ANTIBODY, IGM: Mycoplasma pneumo IgM: <770 U/mL (ref 0–769)

## 2020-06-15 LAB — GLUCOSE, CAPILLARY: Glucose-Capillary: 120 mg/dL — ABNORMAL HIGH (ref 70–99)

## 2020-06-15 LAB — PHOSPHORUS: Phosphorus: 9 mg/dL — ABNORMAL HIGH (ref 2.5–4.6)

## 2020-06-15 LAB — MAGNESIUM: Magnesium: 1.8 mg/dL (ref 1.7–2.4)

## 2020-06-15 MED ORDER — CALCIUM GLUCONATE-NACL 2-0.675 GM/100ML-% IV SOLN: 2.0000 g | Freq: Once | INTRAVENOUS | Status: DC

## 2020-06-15 MED ORDER — SODIUM CHLORIDE 0.9 % IV SOLN
INTRAVENOUS | Status: DC | PRN
  Administered 2020-06-15: 250 mL via INTRAVENOUS

## 2020-06-15 MED ORDER — CALCIUM GLUCONATE-NACL 1-0.675 GM/50ML-% IV SOLN
1.0000 g | Freq: Once | INTRAVENOUS | Status: AC
  Administered 2020-06-15: 1000 mg via INTRAVENOUS
  Filled 2020-06-15: qty 50

## 2020-06-15 NOTE — Progress Notes (Signed)
NAME:  OAKLAND COURTEMANCHE, MRN:  [id number], DOB:  07-21-1965, LOS: 6 ADMISSION DATE:  06/09/2020,  INITIAL CONSULTATION DATE: 06/09/2020  REFERRING MD: Dr. Tamala Julian, CHIEF COMPLAINT: Seizures   History of Present Illness:  This is a 55 yo female who presented to [facility] ER on 04/5 from home with seizure activity.  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  There is concern pt may be close to ESRD and dialysis and/or kidney transplant.  During a previous hospitalization she was started on bumex 2 mg bid and sodium bicarb.  She was recently diagnosed with a viral URI with recommendations of supportive care and close outpatient follow-up on 03/25.  Pts daughter reported the pt has not been "acting the same" for about 2 weeks.  She also reported the pt went from a lying position to a seated position she had a seizure today that lasted for about 2-3 minutes. During the seizure the pts eyes rolled back and both her arms were shaking.  Following the seizure the pt appeared very sleepy for about 5-10 minutes.  EMS reported the pts vital signs were stable when they arrived.  ED Course Upon arrival to the ER pts daughter reported the pt appeared at her baseline, but the pt had no recollection of what happened prior to arrival in the ER.  Pt reported she has had emesis when taking her sodium bicarb tablet and loose stools over the past 24-36 hrs.  She also reported insomnia, on average only able to sleep 1-2 hrs per night over the past 2 weeks.  Lab results revealed Na+ 105, chloride 69, CO2 18, glucose 150, BUN 111, creatinine 6.24, calcium 6.9, anion gap 18, albumin 2.3, troponin 20, wbc 12.7, and hgb 8.8, platelets 440.  CT Head negative for acute intracranial findings or mass lesions.  She received 500 ml LR bolus.  ER physician contacted on call nephrologist Dr. Candiss Norse and he recommended the  following: 3% saline 50 ml bolus x1 to raise Na+ above 110 with slow correction thereafter (goal of ~8 Meq correction in 24hrs).  Received 100 ml bolus of hypertonic saline per ER physician.  Pertinent  Medical History  COVID-19 dx Oct 2021 (received monoclonal antibody infusion) HTN  Stage IV CKD  Nephrotic Syndrome  Lupus  Pelvic Lymphadenopathy  Adiposity    Cultures:  06/11/20: Blood culture x2>>no growth to date 06/11/20: Sputum>> 06/11/20: Strep pneumo urinary antigen>> negative 06/11/20: Legionella urinary antigen>>negative 06/11/20: Mycoplasma pneumoniae >> 06/11/20: Pneumocystis PCR>> 06/11/20: Urine>>no growth  Antimicrobials:  Azithromycin 4/7>> Ceftriaxone 4/7>>  Significant Hospital Events: Including procedures, antibiotic start and stop dates in addition to other pertinent events   . Pt admitted to ICU with hyponatremia Na 105 received 3% saline 50 ml bolus  06/10/20: Remained on 3% Hypertonic saline, latest Na 112 this afternoon (Na 105 upon presentation @ 18:00 yesterday); per discussion with Nephrology will hold 3% Hypertonic saline and follow correction with PO intake 06/11/20: Serum Na remains low, placed on NS overnight. Placing back on 3% Saline by Nephrology, increasing WBC 22.9, CXR with left basilar opacity concerning for atelectasis vs. PNA, placed on empiric Azithromycin + Rocephin, cultures obtained 06/11/20: Serum Na remains below 120 (115), Hypertonic saline increased by Nephrology, Strep pneumo urinary antigen negative, all other cultures still pending.  06/13/2020: Patient noted to have significant anemia, consent obtained  for transfusion.  Sodium 122, continue 3% saline at reduced dose of 20 mL/h. 06/14/2020: Transition from IV 3% hypertonic saline to p.o. saline tablets, 06/15/2020: Remains on PO salt tablets, Na slowly improving, up to 126 on am labs; consider transfer to Med-Surg if Nephrology on board, will transfer service to [facility] tomorrow  Interim History / Subjective:   No acute events reported overnight Afebrile, hemodynamically stable, on room air Na slowly improving on PO salt tablets, up to 126 on am labs today Hgb. 7.8 this morning Pt denies all complaints, tolerating diet Daughter at bedside, reports mental status is improving, pt is A&O x3 Will get PT consult today From PCCM perspective can transfer out of ICU, will check with Nephrology before downgrading status   Objective   Blood pressure 125/73, pulse 78, temperature 98.6 F (37 C), temperature source Oral, resp. rate 15, height '5\' 2"'$  (1.575 m), weight 75.9 kg, SpO2 100 %.        Intake/Output Summary (Last 24 hours) at 06/15/2020 0849 Last data filed at 06/15/2020 0700 Gross per 24 hour  Intake 363.72 ml  Output 450 ml  Net -86.28 ml   Filed Weights   06/11/20 0500 06/12/20 0500 06/13/20 0500  Weight: 73.2 kg 75 kg 75.9 kg    Examination: General: Acutely ill-appearing female, pale, on room air, no acute distress HENT: Atraumatic, normal, neck supple, no JVD Lungs: Clear to auscultation bilaterally, no wheezing or rales noted, even, nonlabored, normal effort Cardiovascular: Regular rate and rhythm, S1-S2, no murmurs, rubs, gallops, 2+ radial pulses, 1+ distal pulses   Abdomen: Soft, nontender, nondistended, no guarding or rebound tenderness, bowel sounds positive x4 Extremities: Normal bulk and tone, no deformities, 2+ anasarca Neuro: Awake, Alert and oriented x3 (disoriented to situation), follows commands, no focal deficits, speech clear, right pupil 2 mm reactive, left prosthetic eye  Labs/imaging that I havepersonally reviewed  (right click and "Reselect all SmartList Selections" daily)  Lab results reviewed 04/11: Na 126, BUN 123, Cr. 5.64, Hgb 7.8, HCT 21.2, Bicarb 15, AG 15 CXR 06/11/20>>Nonspecific left basilar airspace opacity obscures the left hemidiaphragm. Differential considerations include atelectasis, layering pleural effusion and/or infiltrate. 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 salt tablets -Seizure precautions  -Trend BMP  -Replace electrolytes as indicated  -Monitor UOP -Avoid nephrotoxic medications  -Continue to hold outpatient bumex   Mildly elevated troponin likely secondary to demand ischemia in the setting of severe hyponatremia  -Trend troponin (20 ~ 16)  Anemia without obvious acute blood loss  -Trend CBC  -Will resume outpatient ferrous sulfate  -Monitor for s/sx of bleeding -Transfusing for hemoglobin less than 7  Leukocytosis Concern for Left Basilar Pneumonia on CXR 06/11/20 -Monitor fever curve  -Trend WBC's and Procalcitonin -Procalcitonin on downward trend however still 3.56 -Follow cultures as above (strep pneumo and Legionella are negative, urine is negative, blood cultures still pending) -Continue Azithromycin + Rocephin (will plan for 5 day course) pending cultures and sensitivities  Lupus -Resume outpatient cellcept  -Sed rate and CRP elevated consider pulse steroids -Query lupus flare  Hyperglycemia  -CBG's ac/hs -SSI  -Follow ICU Hypo/hyperglycemia protocol  Seizure activity suspected  secondary to severe hyponatremia~CT Head 04/5 negative  Acute encephalopathy  -Seizure precautions  -PRN ativan for seizure activity  -Frequent reorientation   Best practice (right click and "Reselect all SmartList Selections" daily)  Diet:  Oral Pain/Anxiety/Delirium protocol (if indicated): No VAP protocol (if indicated): No DVT prophylaxis: Subcutaneous Heparin GI prophylaxis: H2B Glucose control:  SSI Yes Central  venous access:  N/A Arterial line:  N/A Foley:  N/A Mobility:  bed rest  PT consulted: N/A Last date of multidisciplinary goals of care discussion [06/15/20] Code Status:  full code Disposition: ICU  Updated pt'sdaughter at bedside 06/15/20.  Answered all of patient's & daughter's questions.  Labs   CBC: Recent Labs  Lab 06/09/20 1747 06/09/20 1937 06/11/20 1054 06/12/20 0313 06/13/20 0442 06/13/20 1608 06/14/20 0716 06/15/20 0309  WBC 12.7*   < > 19.1* 17.1* 11.3*  --  11.5* 8.8  NEUTROABS 11.7*  --  17.7*  --   --   --   --   --   HGB 8.8*   < > 7.8* 7.1* 6.5* 7.9* 8.5* 7.8*  HCT 24.0*   < > 21.3* 20.0* 18.8* 22.9* 23.5* 21.2*  MCV 74.5*   < > 75.5* 77.5* 79.7*  --  78.9* 77.9*  PLT 440*   < > 365 301 296  --  319 290   < > = values in this interval not displayed.    Basic Metabolic Panel: Recent Labs  Lab 06/11/20 0257 06/11/20 0639 06/12/20 0313 06/12/20 0634 06/13/20 0442 06/13/20 0638 06/14/20 0716 06/14/20 1105 06/14/20 1503 06/14/20 1926 06/15/20 0013 06/15/20 0309  NA 109*  110*   < > 114*   < > 120*   < > 124* 127* 125* 126* 125* 126*  K 4.0  --  3.9  --  3.9  --  5.2*  --   --   --   --  4.6  CL 75*  --  80*  --  89*  --  96*  --   --   --   --  96*  CO2 18*  --  17*  --  16*  --  13*  --   --   --   --  15*  GLUCOSE 100*  --  96  --  89  --  86  --   --   --   --  93  BUN 117*  --  122*  --  117*  --  127*  --   --   --   --  123*  CREATININE 6.23*  --  5.94*  --  6.02*  --  6.05*  --   --   --   --  5.64*  CALCIUM 6.6*  --  6.3*  --  6.0*  --  6.1*  --   --   --   --  6.0*  MG 2.0  --  1.9  --  1.9  --  1.9  --   --   --   --  1.8  PHOS 10.0*  --  9.9*  --  10.0*  --  10.2*  --   --   --   --  9.0*   < > = values in this interval not displayed.   GFR: Estimated Creatinine Clearance: 11 mL/min (A) (by C-G formula based on SCr of 5.64 mg/dL (H)). Recent Labs  Lab 06/09/20 1747 06/09/20 1937 06/11/20 1054 06/12/20 [id number] 06/13/20 [id number]  06/14/20 [id number] 06/15/20 0309  PROCALCITON  --   --  4.00 3.70 3.56  --   --   WBC 12.7*   < > 19.1* 17.1* 11.3* 11.5* 8.8  LATICACIDVEN 1.9  --   --   --   --   --   --    < > = values in this interval not displayed.    Liver Function Tests: Recent Labs  Lab 06/09/20 1747 06/11/20 0257 06/11/20 1054 06/12/20 0313 06/13/20 0442  AST 28  --  24  --   --   ALT 12  --  14  --   --   ALKPHOS 87  --  82  --   --   BILITOT 0.6  --  0.6  --   --   PROT 6.5  --  5.7*  --   --   ALBUMIN 2.3* 2.0* 2.1* 2.0* 1.8*   CBG: Recent Labs  Lab 06/10/20 0019 06/11/20 0718 06/11/20 2144  GLUCAP 121* 90 100*    Review of Systems:   A 10 point review of systems was performed and it is as noted above otherwise negative.  Allergies No Known Allergies   Scheduled Meds: . Chlorhexidine Gluconate Cloth  6 each Topical Q0600  . heparin  5,000 Units Subcutaneous Q8H  . metoCLOPramide (REGLAN) injection  5 mg Intravenous Q8H  . mycophenolate  500 mg Oral BID  . pantoprazole (PROTONIX) IV  40 mg Intravenous QHS  . sevelamer carbonate  800 mg Oral TID WC  . sodium bicarbonate  650 mg Oral TID  . sodium chloride  1 g Oral BID WC   Continuous Infusions: . sodium chloride Stopped (06/15/20 0643)  . azithromycin Stopped (06/14/20 1800)  . cefTRIAXone (ROCEPHIN)  IV Stopped (06/14/20 1639)   PRN Meds:.sodium chloride, acetaminophen, docusate sodium, LORazepam, ondansetron (ZOFRAN) IV, polyethylene glycol       Darel Hong, AGACNP-BC [facility] Pager: [phone]

## 2020-06-15 NOTE — Progress Notes (Signed)
[place] for Electrolyte Monitoring and Replacement   Recent Labs: Potassium (mmol/L)  Date Value  06/15/2020 4.6   Magnesium (mg/dL)  Date Value  06/15/2020 1.8   Calcium (mg/dL)  Date Value  06/15/2020 6.0 (LL)   Albumin (g/dL)  Date Value  06/13/2020 1.8 (L)   Phosphorus (mg/dL)  Date Value  06/15/2020 9.0 (H)   Sodium (mmol/L)  Date Value  06/15/2020 128 (L)   Corrected Ca 7.76 mg/dL  Assessment: 55 yo F presented with c/o seizure and a sodium level was found to be 105. Pharmacy has been consulted to monitor electrolytes. 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   3% NaCl infusion has stopped and patient is now on NaCl tablets.   Goal of Therapy:  Electrolytes WNL  Plan:   1 gram IV calcium gluconate x 1 per NP  NaCl tablets 1 gram BID per nephrology  phosphorus elevated: will defer treatment to nephrology  This patient is being transferred out of CCU. Because this consult was generated as part of the CCU order set pharmacy will sign out     Vallery Sa, PharmD 06/15/2020 9:25 AM

## 2020-06-15 NOTE — Progress Notes (Signed)
Patient transferred to room 204. PT helped get patient to her new bed. All belongings sent with patient and family who were at bedside when patient was transferring. Report called to [place] [facility] [address].

## 2020-06-15 NOTE — Evaluation (Signed)
Physical Therapy Evaluation Patient Details Name: Carol Hicks MRN: [id number] DOB: 1965/11/07 Today's Date: 06/15/2020   History of Present Illness  Pt admitted for hyponatremia with complaints of seizure activity. Pt with abnormal lab values, however cleared to mobilize with therapist per secure chat. HIstory includes Lupus.  Clinical Impression  Pt is a pleasant 55 year old female who was admitted for hyponatremia. Spanish interpreter used for session. Pt performs bed mobility, transfers, and ambulation with cga and RW. Pt demonstrates deficits with strength/mobility/endurance. Would benefit from skilled PT to address above deficits and promote optimal return to PLOF. Recommend transition to [place] upon discharge from acute hospitalization.     Follow Up Recommendations Home health PT    Equipment Recommendations  None recommended by PT    Recommendations for Other Services       Precautions / Restrictions Precautions Precautions: Fall Restrictions Weight Bearing Restrictions: No      Mobility  Bed Mobility Overal bed mobility: Needs Assistance Bed Mobility: Supine to Sit     Supine to sit: Min guard     General bed mobility comments: cues given and slight assist for trunk. Once seated, able to sit with upright posture. No dizziness noted    Transfers Overall transfer level: Needs assistance Equipment used: Rolling walker (2 wheeled) Transfers: Sit to/from Stand Sit to Stand: Min guard         General transfer comment: safe technique with cues for pushing from seated surface. once standing, upright posture noted.  Ambulation/Gait Ambulation/Gait assistance: Min guard Gait Distance (Feet): 45 Feet Assistive device: Rolling walker (2 wheeled) Gait Pattern/deviations: Step-through pattern     General Gait Details: ambulated in room with RW. Safe technique. RR increases to 30 with exertion, however O2 sats WNL. No LOB noted  Stairs             Wheelchair Mobility    Modified Rankin (Stroke Patients Only)       Balance Overall balance assessment: Needs assistance Sitting-balance support: Bilateral upper extremity supported Sitting balance-Leahy Scale: Good     Standing balance support: Bilateral upper extremity supported Standing balance-Leahy Scale: Good                               Pertinent Vitals/Pain Pain Assessment: No/denies pain    Home Living Family/patient expects to be discharged to:: Private residence Living Arrangements: Children;Spouse/significant other Available Help at Discharge: Family;Available 24 hours/day Type of Home: House Home Access: Stairs to enter Entrance Stairs-Rails: Can reach both Entrance Stairs-Number of Steps: 6 Home Layout: One level Home Equipment: Walker - 2 wheels      Prior Function Level of Independence: Independent with assistive device(s)         Comments: household ambulator using RW. Reports no falls     Hand Dominance        Extremity/Trunk Assessment   Upper Extremity Assessment Upper Extremity Assessment: Overall WFL for tasks assessed (increased edema noted in hands/arms)    Lower Extremity Assessment Lower Extremity Assessment: Generalized weakness (B LE grossly 4/5)       Communication   Communication: Prefers language other than [place];Interpreter utilized  Cognition Arousal/Alertness: Awake/alert Behavior During Therapy: WFL for tasks assessed/performed Overall Cognitive Status: Within Functional Limits for tasks assessed  General Comments      Exercises Other Exercises Other Exercises: supine ther-ex performed on B LE including AP, SLRs, and hip abd/add. All ther-ex x 10 reps with cga. Cues given for sequencing   Assessment/Plan    PT Assessment Patient needs continued PT services  PT Problem List Decreased strength;Decreased activity tolerance;Decreased  balance;Decreased mobility       PT Treatment Interventions Gait training;DME instruction;Therapeutic exercise;Balance training    PT Goals (Current goals can be found in the Care Plan section)  Acute Rehab PT Goals Patient Stated Goal: to go home PT Goal Formulation: With patient Time For Goal Achievement: 06/29/20 Potential to Achieve Goals: Good    Frequency Min 2X/week   Barriers to discharge        Co-evaluation               AM-PAC PT "6 Clicks" Mobility  Outcome Measure Help needed turning from your back to your side while in a flat bed without using bedrails?: A Little Help needed moving from lying on your back to sitting on the side of a flat bed without using bedrails?: A Little Help needed moving to and from a bed to a chair (including a wheelchair)?: A Little Help needed standing up from a chair using your arms (e.g., wheelchair or bedside chair)?: A Little Help needed to walk in hospital room?: A Little Help needed climbing 3-5 steps with a railing? : A Little 6 Click Score: 18    End of Session Equipment Utilized During Treatment: Gait belt Activity Tolerance: Patient tolerated treatment well Patient left: in bed;with nursing/sitter in room (in transport to next room) Nurse Communication: Mobility status PT Visit Diagnosis: Muscle weakness (generalized) (M62.81);Difficulty in walking, not elsewhere classified (R26.2)    Time: 1441-1520 PT Time Calculation (min) (ACUTE ONLY): 39 min   Charges:   PT Evaluation $PT Eval Low Complexity: 1 Low PT Treatments $Gait Training: 8-22 mins $Therapeutic Exercise: 8-22 mins        Greggory Stallion, PT, DPT [phone]   Shirlie Enck 06/15/2020, 4:51 PM

## 2020-06-15 NOTE — Progress Notes (Signed)
[place], [place] 06/15/20  Subjective:   Hospital day # 6  Patient resting comfortably at bedside. Currently denies shortness of breath. States that she has a good appetite. Daughter at bedside.    04/10 0701 - 04/11 0700 In: 363.7 [I.V.:0.2; IV Piggyback:363.5] Out: 450 [Urine:450] Lab Results  Component Value Date   CREATININE 5.64 (H) 06/15/2020   CREATININE 6.05 (H) 06/14/2020   CREATININE 6.02 (H) 06/13/2020     Objective:  Vital signs in last 24 hours:  Temp:  [98 F (36.7 C)-98.7 F (37.1 C)] 98.3 F (36.8 C) (04/11 0840) Pulse Rate:  [71-100] 86 (04/11 0840) Resp:  [13-45] 18 (04/11 0840) BP: (104-143)/(54-78) 125/73 (04/11 0800) SpO2:  [97 %-100 %] 100 % (04/11 0840)  Weight change:  Filed Weights   06/11/20 0500 06/12/20 0500 06/13/20 0500  Weight: 73.2 kg 75 kg 75.9 kg    Intake/Output:    Intake/Output Summary (Last 24 hours) at 06/15/2020 0947 Last data filed at 06/15/2020 0800 Gross per 24 hour  Intake 402.89 ml  Output 450 ml  Net -47.11 ml    Physical Exam: General:  No acute distress, laying in the bed  HEENT  moist oral mucous membrane  Pulm/lungs  normal breathing effort, lungs are clear to auscultation  CVS/Heart  regular rhythm, no rub or gallop  Abdomen:   Soft, nontender  Extremities:  No peripheral edema  Neurologic:  Awake, alert, conversant  Skin:  No acute rashes    Basic Metabolic Panel:  Recent Labs  Lab 06/11/20 0257 06/11/20 0639 06/12/20 0313 06/12/20 0634 06/13/20 0442 06/13/20 2111 06/14/20 0716 06/14/20 1105 06/14/20 1503 06/14/20 1926 06/15/20 0013 06/15/20 0309 06/15/20 0659  NA 109*  110*   < > 114*   < > 120*   < > 124*   < > 125* 126* 125* 126* 128*  K 4.0  --  3.9  --  3.9  --  5.2*  --   --   --   --  4.6  --   CL 75*  --  80*  --  89*  --  96*  --   --   --   --  96*  --   CO2 18*  --  17*  --  16*  --  13*  --   --   --   --  15*  --   GLUCOSE 100*  --  96  --   89  --  86  --   --   --   --  93  --   BUN 117*  --  122*  --  117*  --  127*  --   --   --   --  123*  --   CREATININE 6.23*  --  5.94*  --  6.02*  --  6.05*  --   --   --   --  5.64*  --   CALCIUM 6.6*  --  6.3*  --  6.0*  --  6.1*  --   --   --   --  6.0*  --   MG 2.0  --  1.9  --  1.9  --  1.9  --   --   --   --  1.8  --   PHOS 10.0*  --  9.9*  --  10.0*  --  10.2*  --   --   --   --  9.0*  --    < > =  values in this interval not displayed.     CBC: Recent Labs  Lab 06/09/20 1747 06/09/20 1937 06/11/20 1054 06/12/20 0313 06/13/20 0442 06/13/20 1608 06/14/20 0716 06/15/20 0309  WBC 12.7*   < > 19.1* 17.1* 11.3*  --  11.5* 8.8  NEUTROABS 11.7*  --  17.7*  --   --   --   --   --   HGB 8.8*   < > 7.8* 7.1* 6.5* 7.9* 8.5* 7.8*  HCT 24.0*   < > 21.3* 20.0* 18.8* 22.9* 23.5* 21.2*  MCV 74.5*   < > 75.5* 77.5* 79.7*  --  78.9* 77.9*  PLT 440*   < > 365 301 296  --  319 290   < > = values in this interval not displayed.     No results found for: HEPBSAG, HEPBSAB, HEPBIGM    Microbiology:  Recent Results (from the past 240 hour(s))  Resp Panel by RT-PCR (Flu A&B, Covid) Nasopharyngeal Swab     Status: None   Collection Time: 06/09/20  7:37 PM   Specimen: Nasopharyngeal Swab; Nasopharyngeal(NP) swabs in vial transport medium  Result Value Ref Range Status   SARS Coronavirus 2 by RT PCR NEGATIVE NEGATIVE Final    Comment: (NOTE) SARS-CoV-2 target nucleic acids are NOT DETECTED.  The SARS-CoV-2 RNA is generally detectable in upper respiratory specimens during the acute phase of infection. The lowest concentration of SARS-CoV-2 viral copies this assay can detect is 138 copies/mL. A negative result does not preclude SARS-Cov-2 infection and should not be used as the sole basis for treatment or other patient management decisions. A negative result may occur with  improper specimen collection/handling, submission of specimen other than nasopharyngeal swab, presence of viral  mutation(s) within the areas targeted by this assay, and inadequate number of viral copies(<138 copies/mL). A negative result must be combined with clinical observations, patient history, and epidemiological information. The expected result is Negative.  Fact Sheet for Patients:  EntrepreneurPulse.com.au  Fact Sheet for Healthcare Providers:  IncredibleEmployment.be  This test is no t yet approved or cleared by the [place] FDA and  has been authorized for detection and/or diagnosis of SARS-CoV-2 by FDA under an Emergency Use Authorization (EUA). This EUA will remain  in effect (meaning this test can be used) for the duration of the COVID-19 declaration under Section 564(b)(1) of the Act, 21 U.S.C.section 360bbb-3(b)(1), unless the authorization is terminated  or revoked sooner.       Influenza A by PCR NEGATIVE NEGATIVE Final   Influenza B by PCR NEGATIVE NEGATIVE Final    Comment: (NOTE) The Xpert Xpress SARS-CoV-2/FLU/RSV plus assay is intended as an aid in the diagnosis of influenza from Nasopharyngeal swab specimens and should not be used as a sole basis for treatment. Nasal washings and aspirates are unacceptable for Xpert Xpress SARS-CoV-2/FLU/RSV testing.  Fact Sheet for Patients: EntrepreneurPulse.com.au  Fact Sheet for Healthcare Providers: IncredibleEmployment.be  This test is not yet approved or cleared by the [place] FDA and has been authorized for detection and/or diagnosis of SARS-CoV-2 by FDA under an Emergency Use Authorization (EUA). This EUA will remain in effect (meaning this test can be used) for the duration of the COVID-19 declaration under Section 564(b)(1) of the Act, 21 U.S.C. section 360bbb-3(b)(1), unless the authorization is terminated or revoked.  Performed at [facility], [address] [place], [place] [address]   MRSA PCR Screening     Status:  None   Collection Time: 06/10/20 12:24  AM   Specimen: Nasopharyngeal  Result Value Ref Range Status   MRSA by PCR NEGATIVE NEGATIVE Final    Comment:        The GeneXpert MRSA Assay (FDA approved for NASAL specimens only), is one component of a comprehensive MRSA colonization surveillance program. It is not intended to diagnose MRSA infection nor to guide or monitor treatment for MRSA infections. Performed at [facility], [address], [place], [place] [address]   Urine Culture     Status: None   Collection Time: 06/10/20  6:06 AM   Specimen: Urine, Random  Result Value Ref Range Status   Specimen Description   Final    URINE, RANDOM Performed at [facility], [address], [place], [place] [address]    Special Requests   Final    NONE Performed at [facility], [address], [place], [place] [address]    Culture   Final    NO GROWTH Performed at [facility], [place] [address] [place], [place] [address]    Report Status 06/12/2020 FINAL  Final  CULTURE, BLOOD (ROUTINE X 2) w Reflex to ID Panel     Status: None (Preliminary result)   Collection Time: 06/11/20 10:54 AM   Specimen: BLOOD RIGHT HAND  Result Value Ref Range Status   Specimen Description BLOOD RIGHT HAND  Final   Special Requests   Final    BOTTLES DRAWN AEROBIC AND ANAEROBIC Blood Culture results may not be optimal due to an excessive volume of blood received in culture bottles   Culture   Final    NO GROWTH 4 DAYS Performed at [facility], [address] [place], [place] [address]    Report Status PENDING  Incomplete  CULTURE, BLOOD (ROUTINE X 2) w Reflex to ID Panel     Status: None (Preliminary result)   Collection Time: 06/11/20 11:04 AM   Specimen: BLOOD RIGHT ARM  Result Value Ref Range Status   Specimen Description BLOOD RIGHT ARM  Final   Special Requests   Final    BOTTLES DRAWN AEROBIC AND ANAEROBIC Blood Culture results may not be optimal  due to an excessive volume of blood received in culture bottles   Culture   Final    NO GROWTH 4 DAYS Performed at [facility], [address] [place], [place] [address]    Report Status PENDING  Incomplete    Coagulation Studies: No results for input(s): LABPROT, INR in the last 72 hours.  Urinalysis: No results for input(s): COLORURINE, LABSPEC, PHURINE, GLUCOSEU, HGBUR, BILIRUBINUR, KETONESUR, PROTEINUR, UROBILINOGEN, NITRITE, LEUKOCYTESUR in the last 72 hours.  Invalid input(s): APPERANCEUR    Imaging: No results found.   Medications:   . sodium chloride 10 mL/hr at 06/15/20 0800  . azithromycin Stopped (06/14/20 1800)  . cefTRIAXone (ROCEPHIN)  IV Stopped (06/14/20 1639)   . Chlorhexidine Gluconate Cloth  6 each Topical Q0600  . heparin  5,000 Units Subcutaneous Q8H  . metoCLOPramide (REGLAN) injection  5 mg Intravenous Q8H  . mycophenolate  500 mg Oral BID  . pantoprazole (PROTONIX) IV  40 mg Intravenous QHS  . sevelamer carbonate  800 mg Oral TID WC  . sodium bicarbonate  650 mg Oral TID  . sodium chloride  1 g Oral BID WC   sodium chloride, acetaminophen, docusate sodium, LORazepam, ondansetron (ZOFRAN) IV, polyethylene glycol  Assessment/ Plan:  55 y.o. female with hypertension, lupus nephritis, chronic kidney disease stage V  admitted on 06/09/2020 for Hyponatremia [E87.1] Seizure ([place]) [R56.9] Acute renal failure, unspecified acute renal failure type ([place]) [N17.9]   # Severe hyponatremia Likely due to excessive diuresis Presented with seizures Received 500 cc LR in ED and 3% hypertonic saline small bolus and infusion off and on -Sodium now 128.  Off 3% saline.  Continue salt tablets for now.  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 EM with membranous pattern and  extraglomerular immune deposits as well as some inflammation and fibrosis in the interstitium by EM. Favoring autoimmune process."  Was on Cell cept, plaquenil and prednisone as outpatient  -Continues to have quite low renal function with a EGFR of 8.  Suspect that she will likely require renal placement therapy in the relative near future.  This was explained to the patient and her family in depth today.  They wish to discuss this amongst some cells a bit further.  #Hypertension  BP Readings from Last 1 Encounters:  06/15/20 125/73   Currently off of her antihypertensives at the moment.  #Hyperphosphatemia likely due to secondary hyperparathyroidism Patient receiving IV calcium for hypocalcemia at the moment.  Remains on Renvela as well.   LOS: 6 Carol Hicks 4/11/20229:47 AM  [place], [place]  Note: This note was prepared with Dragon dictation. Any transcription errors are unintentional

## 2020-06-16 ENCOUNTER — Other Ambulatory Visit (INDEPENDENT_AMBULATORY_CARE_PROVIDER_SITE_OTHER): Payer: Self-pay | Admitting: Vascular Surgery

## 2020-06-16 DIAGNOSIS — D72829 Elevated white blood cell count, unspecified: Secondary | ICD-10-CM

## 2020-06-16 DIAGNOSIS — F13239 Sedative, hypnotic or anxiolytic dependence with withdrawal, unspecified: Secondary | ICD-10-CM

## 2020-06-16 DIAGNOSIS — F13288 Sedative, hypnotic or anxiolytic dependence with other sedative, hypnotic or anxiolytic-induced disorder: Secondary | ICD-10-CM

## 2020-06-16 DIAGNOSIS — M329 Systemic lupus erythematosus, unspecified: Secondary | ICD-10-CM

## 2020-06-16 DIAGNOSIS — N185 Chronic kidney disease, stage 5: Secondary | ICD-10-CM

## 2020-06-16 LAB — CBC
HCT: 21.9 % — ABNORMAL LOW (ref 36.0–46.0)
Hemoglobin: 7.5 g/dL — ABNORMAL LOW (ref 12.0–15.0)
MCH: 28 pg (ref 26.0–34.0)
MCHC: 34.2 g/dL (ref 30.0–36.0)
MCV: 81.7 fL (ref 80.0–100.0)
Platelets: 292 10*3/uL (ref 150–400)
RBC: 2.68 MIL/uL — ABNORMAL LOW (ref 3.87–5.11)
RDW: 12.9 % (ref 11.5–15.5)
WBC: 7.3 10*3/uL (ref 4.0–10.5)
nRBC: 0 % (ref 0.0–0.2)

## 2020-06-16 LAB — BASIC METABOLIC PANEL
Anion gap: 14 (ref 5–15)
BUN: 122 mg/dL — ABNORMAL HIGH (ref 6–20)
CO2: 16 mmol/L — ABNORMAL LOW (ref 22–32)
Calcium: 6.4 mg/dL — CL (ref 8.9–10.3)
Chloride: 96 mmol/L — ABNORMAL LOW (ref 98–111)
Creatinine, Ser: 5.46 mg/dL — ABNORMAL HIGH (ref 0.44–1.00)
GFR, Estimated: 9 mL/min — ABNORMAL LOW (ref >60)
Glucose, Bld: 99 mg/dL (ref 70–99)
Potassium: 3.9 mmol/L (ref 3.5–5.1)
Sodium: 126 mmol/L — ABNORMAL LOW (ref 135–145)

## 2020-06-16 LAB — PARATHYROID HORMONE, INTACT (NO CA): PTH: 175 pg/mL — ABNORMAL HIGH (ref 15–65)

## 2020-06-16 LAB — CULTURE, BLOOD (ROUTINE X 2)
Culture: NO GROWTH
Culture: NO GROWTH

## 2020-06-16 LAB — SODIUM
Sodium: 127 mmol/L — ABNORMAL LOW (ref 135–145)
Sodium: 127 mmol/L — ABNORMAL LOW (ref 135–145)
Sodium: 126 mmol/L — ABNORMAL LOW (ref 135–145)
Sodium: 128 mmol/L — ABNORMAL LOW (ref 135–145)
Sodium: 127 mmol/L — ABNORMAL LOW (ref 135–145)
Sodium: 127 mmol/L — ABNORMAL LOW (ref 135–145)

## 2020-06-16 LAB — BPAM RBC
Blood Product Expiration Date: 202204102359
ISSUE DATE / TIME: 202204091059
Unit Type and Rh: 9500

## 2020-06-16 LAB — TYPE AND SCREEN
ABO/RH(D): O POS
Antibody Screen: NEGATIVE
Unit division: 0

## 2020-06-16 MED ORDER — FERROUS SULFATE 325 (65 FE) MG PO TABS: 325.0000 mg | ORAL_TABLET | Freq: Every day | ORAL | Status: DC

## 2020-06-16 MED ORDER — FERROUS SULFATE 325 (65 FE) MG PO TABS
325.0000 mg | ORAL_TABLET | ORAL | Status: DC
  Administered 2020-06-16 – 2020-06-22 (×4): 325 mg via ORAL
  Filled 2020-06-16 (×4): qty 1

## 2020-06-16 MED ORDER — CALCIUM GLUCONATE-NACL 2-0.675 GM/100ML-% IV SOLN
2.0000 g | Freq: Once | INTRAVENOUS | Status: AC
  Administered 2020-06-16: 2000 mg via INTRAVENOUS
  Filled 2020-06-16: qty 100

## 2020-06-16 NOTE — Consult Note (Signed)
[facility] Consult Note  MRN : [id number]  Carol Hicks is a 55 y.o. (1965-10-29) female who presents with chief complaint of  Chief Complaint  Patient presents with  . Seizures   History of Present Illness: This is a 55 yo female who presented to [facility] ER on 04/5 from home with seizure activity.   Upon review of records pt has a hx of lupus and is currently being worked up for possible lupus nephritis at [facility], and recently started CellCept 250 mg 2 capsules bid on 03/18. There is concern pt may be close to ESRD and dialysis and/or kidney transplant.  During a previous hospitalization she was started on bumex 2 mg bid and sodium bicarb.  She was recently diagnosed with a viral URI with recommendations of supportive care and close outpatient follow-up on 03/25.    Pts daughter reported the pt has not been "acting the same" for about 2 weeks.  She also reported the pt went from a lying position to a seated position she had a seizure today that lasted for about 2-3 minutes. During the seizure the pts eyes rolled back and both her arms were shaking.  Following the seizure the pt appeared very sleepy for about 5-10 minutes.  EMS reported the pts vital signs were stable when they arrived.  ED Course Upon arrival to the ER pts daughter reported the pt appeared at her baseline, but the pt had no recollection of what happened prior to arrival in the ER.  Pt reported she has had emesis when taking her sodium bicarb tablet and loose stools over the past 24-36 hrs.  She also reported insomnia, on average only able to sleep 1-2 hrs per night over the past 2 weeks.  Lab results revealed Na+ 105, chloride 69, CO2 18, glucose 150, BUN 111, creatinine 6.24, calcium 6.9, anion gap 18, albumin 2.3, troponin 20, wbc 12.7, and hgb 8.8, platelets 440.  CT Head negative for acute intracranial findings or mass lesions.  She received 500 ml LR bolus.  ER physician contacted on call  nephrologist Dr. Candiss Norse and he recommended the following: 3% saline 50 ml bolus x1 to raise Na+ above 110 with slow correction thereafter (goal of ~8 Meq correction in 24hrs).  Received 100 ml bolus of hypertonic saline per ER physician.  Patient has been seen by nephrology who would like to initiate dialysis.  Vascular surgery was consulted for placement of dialysis access to allow the patient to dialyze.  Current Facility-Administered Medications  Medication Dose Route Frequency Provider Last Rate Last Admin  . 0.9 %  sodium chloride infusion   Intravenous PRN Rust-Chester, Britton L, NP 10 mL/hr at 06/15/20 1200 Infusion Verify at 06/15/20 1200  . acetaminophen (TYLENOL) tablet 650 mg  650 mg Oral Q4H PRN Awilda Bill, NP   650 mg at 06/12/20 0347  . Chlorhexidine Gluconate Cloth 2 % PADS 6 each  6 each Topical Q0600 Awilda Bill, NP   6 each at 06/13/20 1600  . docusate sodium (COLACE) capsule 100 mg  100 mg Oral BID PRN Awilda Bill, NP   100 mg at 06/13/20 1645  . heparin injection 5,000 Units  5,000 Units Subcutaneous Q8H Awilda Bill, NP   5,000 Units at 06/16/20 0631  . LORazepam (ATIVAN) injection 1-2 mg  1-2 mg Intravenous Q4H PRN Awilda Bill, NP      . metoCLOPramide (REGLAN) injection 5 mg  5 mg Intravenous Q8H  Tyler Pita, MD   5 mg at 06/16/20 0631  . mycophenolate (CELLCEPT) capsule 500 mg  500 mg Oral BID Darel Hong D, NP   500 mg at 06/16/20 0631  . ondansetron (ZOFRAN) injection 4 mg  4 mg Intravenous Q6H PRN Awilda Bill, NP   4 mg at 06/13/20 1811  . pantoprazole (PROTONIX) injection 40 mg  40 mg Intravenous QHS Tyler Pita, MD   40 mg at 06/15/20 2121  . polyethylene glycol (MIRALAX / GLYCOLAX) packet 17 g  17 g Oral Daily PRN Awilda Bill, NP   17 g at 06/14/20 1311  . sevelamer carbonate (RENVELA) tablet 800 mg  800 mg Oral TID WC Murlean Iba, MD   800 mg at 06/16/20 1134  . sodium bicarbonate tablet 650 mg  650 mg Oral TID  Lyla Son, MD   650 mg at 06/16/20 1134  . sodium chloride tablet 1 g  1 g Oral BID WC Tyler Pita, MD   1 g at 06/16/20 [id number]   Past Medical History:  Diagnosis Date  . Kidney disease   . Lupus ([place])   . Prosthetic eye globe    History reviewed. No pertinent surgical history.  Social History Social History   Tobacco Use  . Smoking status: Never Smoker  . Smokeless tobacco: Never Used  Vaping Use  . Vaping Use: Never used  Substance Use Topics  . Alcohol use: Not Currently  . Drug use: Never   Family History No family history on file.  Denies family history of peripheral artery disease, venous disease or renal disease.  No Known Allergies  REVIEW OF SYSTEMS (Negative unless checked)  Constitutional: '[]'$ Weight loss  '[]'$ Fever  '[]'$ Chills Cardiac: '[]'$ Chest pain   '[]'$ Chest pressure   '[]'$ Palpitations   '[]'$ Shortness of breath when laying flat   '[]'$ Shortness of breath at rest   '[]'$ Shortness of breath with exertion. Vascular:  '[]'$ Pain in legs with walking   '[]'$ Pain in legs at rest   '[]'$ Pain in legs when laying flat   '[]'$ Claudication   '[]'$ Pain in feet when walking  '[]'$ Pain in feet at rest  '[]'$ Pain in feet when laying flat   '[]'$ History of DVT   '[]'$ Phlebitis   '[]'$ Swelling in legs   '[]'$ Varicose veins   '[]'$ Non-healing ulcers Pulmonary:   '[]'$ Uses home oxygen   '[]'$ Productive cough   '[]'$ Hemoptysis   '[]'$ Wheeze  '[]'$ COPD   '[]'$ Asthma Neurologic:  '[]'$ Dizziness  '[]'$ Blackouts   '[x]'$ Seizures   '[]'$ History of stroke   '[]'$ History of TIA  '[]'$ Aphasia   '[]'$ Temporary blindness   '[]'$ Dysphagia   '[]'$ Weakness or numbness in arms   '[x]'$ Weakness or numbness in legs Musculoskeletal:  '[]'$ Arthritis   '[]'$ Joint swelling   '[]'$ Joint pain   '[]'$ Low back pain Hematologic:  '[]'$ Easy bruising  '[]'$ Easy bleeding   '[]'$ Hypercoagulable state   '[]'$ Anemic  '[]'$ Hepatitis Gastrointestinal:  '[]'$ Blood in stool   '[]'$ Vomiting blood  '[]'$ Gastroesophageal reflux/heartburn   '[]'$ Difficulty swallowing. Genitourinary:  '[x]'$ Chronic kidney disease   '[]'$ Difficult urination  '[]'$ Frequent  urination  '[]'$ Burning with urination   '[]'$ Blood in urine Skin:  '[]'$ Rashes   '[]'$ Ulcers   '[]'$ Wounds Psychological:  '[]'$ History of anxiety   '[]'$  History of major depression.  Physical Examination  Vitals:   06/15/20 2250 06/16/20 0351 06/16/20 0404 06/16/20 0749  BP: 134/80 136/69 136/70 134/73  Pulse: 82 93 90 86  Resp: '18 18 18 16  '$ Temp: 98.1 F (36.7 C) 98.3 F (36.8 C) 98.7 F (37.1 C) 98.6 F (37 C)  TempSrc: Oral Oral Oral   SpO2: 93% 100% 99% 99%  Weight:      Height:       Body mass index is 30.6 kg/m. Gen: Lethargic, NAD Head: [place]/AT, No temporalis wasting. Prominent temp pulse not noted. Ear/Nose/Throat: Hearing grossly intact, nares w/o erythema or drainage, oropharynx w/o Erythema/Exudate Eyes: Sclera non-icteric, conjunctiva clear Neck: Trachea midline.  No JVD.  Pulmonary:  Good air movement, respirations not labored, equal bilaterally.  Cardiac: RRR, normal S1, S2. Vascular:  Vessel Right Left  Radial Palpable Palpable  Ulnar Palpable Palpable  Brachial Palpable Palpable  Carotid Palpable, without bruit Palpable, without bruit  Aorta Not palpable N/A  Femoral Palpable Palpable  Popliteal Palpable Palpable  PT Palpable Palpable  DP Palpable Palpable   Gastrointestinal: soft, non-tender/non-distended. No guarding/reflex.  Musculoskeletal: M/S 5/5 throughout.  Extremities without ischemic changes.  No deformity or atrophy. No edema. Neurologic: Sensation grossly intact in extremities.  Symmetrical.  Speech is fluent. Motor exam as listed above. Psychiatric: Judgment intact, Mood & affect appropriate for pt's clinical situation. Dermatologic: No rashes or ulcers noted.  No cellulitis or open wounds. Lymph : No Cervical, Axillary, or Inguinal lymphadenopathy.  CBC Lab Results  Component Value Date   WBC 7.3 06/16/2020   HGB 7.5 (L) 06/16/2020   HCT 21.9 (L) 06/16/2020   MCV 81.7 06/16/2020   PLT 292 06/16/2020   BMET    Component Value Date/Time   NA 126  (L) 06/16/2020 1150   K 3.9 06/16/2020 0250   CL 96 (L) 06/16/2020 0250   CO2 16 (L) 06/16/2020 0250   GLUCOSE 99 06/16/2020 0250   BUN 122 (H) 06/16/2020 0250   CREATININE 5.46 (H) 06/16/2020 0250   CALCIUM 6.4 (LL) 06/16/2020 0250   GFRNONAA 9 (L) 06/16/2020 0250   Estimated Creatinine Clearance: 11.4 mL/min (A) (by C-G formula based on SCr of 5.46 mg/dL (H)).  COAG No results found for: INR, PROTIME  Radiology CT Head Wo Contrast  Result Date: 06/09/2020 CLINICAL DATA:  Mental status change. EXAM: CT HEAD WITHOUT CONTRAST TECHNIQUE: Contiguous axial images were obtained from the base of the skull through the vertex without intravenous contrast. COMPARISON:  None. FINDINGS: Brain: The ventricles are in the midline without mass effect or shift. No extra-axial fluid collections are identified. The gray-white differentiation is maintained. No findings for acute intracranial process such as hemorrhage or infarction. No mass lesions. The brainstem and cerebellum are unremarkable. Are few tiny scattered parenchymal calcifications which could be due to remote inflammation or infection. Vascular: Scattered age advanced vascular calcifications. No aneurysm or hyperdense vessels. Skull: No skull fracture or bone lesions. Sinuses/Orbits: Chronic appearing right-sided maxillary sinusitis with a thickened sinus wall. There is also mild right-sided ethmoid sinus disease. 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 Other: No scalp lesions or scalp hematoma. IMPRESSION: 1. No acute intracranial findings or mass lesions. 2. Chronic appearing right-sided maxillary sinusitis. Electronically Signed   By: Marijo Sanes M.D.   On: 06/09/2020 18:40   DG Chest Port 1 View  Result Date: 06/11/2020 CLINICAL DATA:  Leukocytosis, history of systemic lupus erythematosus EXAM: PORTABLE CHEST 1 VIEW COMPARISON:  None. FINDINGS: The right lung is clear and negative for focal  airspace consolidation, pulmonary edema or suspicious pulmonary nodule. Nonspecific left basilar airspace opacity resulting in obscuration of the left hemidiaphragm. No pleural effusion or pneumothorax. Cardiac and mediastinal contours are within normal limits. No acute fracture  or lytic or blastic osseous lesions. The visualized upper abdominal bowel gas pattern is unremarkable. IMPRESSION: Nonspecific left basilar airspace opacity obscures the left hemidiaphragm. Differential considerations include atelectasis, layering pleural effusion and/or infiltrate. Recommend dedicated PA and lateral chest x-ray. Electronically Signed   By: Jacqulynn Cadet M.D.   On: 06/11/2020 09:28   Assessment/Plan The patient is a 55 year old female with multiple medical issues including chronic kidney disease who initially presented to the [facility] emergency department on June 09, 2020 seizure activity  1.  Acute on chronic renal failure: Patient has been seen and examined by nephrology.  Nephrology would like to initiate dialysis at this time however the patient does not have an adequate dialysis access.  Recommend placing a PermCath so the patient can undergo dialysis in the inpatient outpatient setting.  Procedure, risks and benefits were explained to the patient and her husband who is at the bedside.  All questions were answered.  Patient and her family would like to proceed.  We will plan on this tomorrow with either Dr. Lucky Cowboy or Dr. Delana Meyer  2.  Seizure activity: Patient has been admitted to the hospitalist team who are managing this.  3.  Morbid obesity: Encouraged weight loss  Discussed with Dr. Francene Castle, PA-C  06/16/2020 1:33 PM  This note was created with Dragon medical transcription system.  Any error is purely unintentional.

## 2020-06-16 NOTE — Progress Notes (Signed)
PROGRESS NOTE    Carol Hicks  [id number] DOB: 04/15/1965 DOA: 06/09/2020 PCP: Pcp, No    Brief Narrative:  This is a 55 yo female who presented to [facility] ER on 04/5 from home with seizure activity.  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  There is concern pt may be close to ESRD and dialysis and/or kidney transplant.  During a previous hospitalization she was started on bumex 2 mg bid and sodium bicarb.  She was recently diagnosed with a viral URI with recommendations of supportive care and close outpatient follow-up on 03/25.  Pts daughter reported the pt has not been "acting the same" for about 2 weeks.  She also reported the pt went from a lying position to a seated position she had a seizure today that lasted for about 2-3 minutes. During the seizure the pts eyes rolled back and both her arms were shaking.  Following the seizure the pt appeared very sleepy for about 5-10 minutes.  EMS reported the pts vital signs were stable when they arrived.  ED Course Upon arrival to the ER pts daughter reported the pt appeared at her baseline, but the pt had no recollection of what happened prior to arrival in the ER.  Pt reported she has had emesis when taking her sodium bicarb tablet and loose stools over the past 24-36 hrs.  She also reported insomnia, on average only able to sleep 1-2 hrs per night over the past 2 weeks.  Lab results revealed Na+ 105, chloride 69, CO2 18, glucose 150, BUN 111, creatinine 6.24, calcium 6.9, anion gap 18, albumin 2.3, troponin 20, wbc 12.7, and hgb 8.8, platelets 440.  CT Head negative for acute intracranial findings or mass lesions.  She received 500 ml LR bolus.  ER physician contacted on call nephrologist Dr. Candiss Norse and he recommended the following: 3% saline 50 ml bolus x1 to raise Na+ above 110 with slow correction thereafter (goal of  ~8 Meq correction in 24hrs).  Received 100 ml bolus of hypertonic saline per ER physician.   Pt admitted to ICU with hyponatremia Na 105 received 3% saline 50 ml bolus  06/10/20: Remained on 3% Hypertonic saline, latest Na 112 this afternoon (Na 105 upon presentation @ 18:00 yesterday); per discussion with Nephrology will hold 3% Hypertonic saline and follow correction with PO intake 06/11/20: Serum Na remains low, placed on NS overnight. Placing back on 3% Saline by Nephrology, increasing WBC 22.9, CXR with left basilar opacity concerning for atelectasis vs. PNA, placed on empiric Azithromycin + Rocephin, cultures obtained 06/11/20: Serum Na remains below 120 (115), Hypertonic saline increased by Nephrology, Strep pneumo urinary antigen negative, all other cultures still pending.  06/13/2020: Patient noted to have significant anemia, consent obtained for transfusion.  Sodium 122, continue 3% saline at reduced dose of 20 mL/h. 06/14/2020: Transition from IV 3% hypertonic saline to p.o. saline tablets, 06/15/2020: Remains on PO salt tablets, Na slowly improving, up to 126 on am labs; consider transfer to [place] if Nephrology on board, will transfer service to [facility] tomorrow  4/12-PCCM pickup today  Consultants:   Nephrology, PCCM, vascular  Procedures:   Antimicrobials:  06/11/20: Blood culture x2>>no growth to date 06/11/20: Sputum>> 06/11/20: Strep pneumo urinary antigen>> negative 06/11/20: Legionella urinary antigen>>negative 06/11/20: Mycoplasma pneumoniae >> 06/11/20: Pneumocystis PCR>> 06/11/20: Urine>>no growth  Azithromycin 4/7>>  Ceftriaxone 4/7>>  Subjective: Patient sleepy but awakens and answers my question.  Has no complaints.  Has been complaining she sleeps too much.  Objective: Vitals:   06/16/20 0351 06/16/20 0404 06/16/20 0749 06/16/20 1420  BP: 136/69 136/70 134/73 116/75  Pulse: 93 90 86 79  Resp: '18 18 16 15  '$ Temp: 98.3 F (36.8 C) 98.7 F (37.1 C) 98.6 F (37 C) 98 F (36.7  C)  TempSrc: Oral Oral  Oral  SpO2: 100% 99% 99% 98%  Weight:      Height:        Intake/Output Summary (Last 24 hours) at 06/16/2020 1519 Last data filed at 06/16/2020 1515 Gross per 24 hour  Intake 460.53 ml  Output --  Net 460.53 ml   Filed Weights   06/11/20 0500 06/12/20 0500 06/13/20 0500  Weight: 73.2 kg 75 kg 75.9 kg    Examination:  General exam: Appears calm and comfortable, sleepy Respiratory system: Clear to auscultation. Respiratory effort normal. Cardiovascular system: S1 & S2 heard, RRR.  No gallops  gastrointestinal system: Abdomen is nondistended, soft and nontender. Normal bowel sounds heard. Central nervous system: Alert and oriented x3 Extremities: +b/l pitting edema Skin: Warm dry Psychiatry Mood & affect appropriate setting.     Data Reviewed: I have personally reviewed following labs and imaging studies  CBC: Recent Labs  Lab 06/09/20 1747 06/09/20 1937 06/11/20 1054 06/12/20 0313 06/13/20 0442 06/13/20 1608 06/14/20 0716 06/15/20 0309 06/16/20 0250  WBC 12.7*   < > 19.1* 17.1* 11.3*  --  11.5* 8.8 7.3  NEUTROABS 11.7*  --  17.7*  --   --   --   --   --   --   HGB 8.8*   < > 7.8* 7.1* 6.5* 7.9* 8.5* 7.8* 7.5*  HCT 24.0*   < > 21.3* 20.0* 18.8* 22.9* 23.5* 21.2* 21.9*  MCV 74.5*   < > 75.5* 77.5* 79.7*  --  78.9* 77.9* 81.7  PLT 440*   < > 365 301 296  --  319 290 292   < > = values in this interval not displayed.   Basic Metabolic Panel: Recent Labs  Lab 06/11/20 0257 06/11/20 [id number] 06/12/20 0313 06/12/20 [id number] 06/13/20 0442 06/13/20 [id number] 06/14/20 0716 06/14/20 1105 06/15/20 0309 06/15/20 0659 06/16/20 0015 06/16/20 0250 06/16/20 0720 06/16/20 1150 06/16/20 1456  NA 109*  110*   < > 114*   < > 120*   < > 124*   < > 126*   < > 127* 126* 127* 126* 128*  K 4.0  --  3.9  --  3.9  --  5.2*  --  4.6  --   --  3.9  --   --   --   CL 75*  --  80*  --  89*  --  96*  --  96*  --   --  96*  --   --   --   CO2 18*  --  17*  --  16*   --  13*  --  15*  --   --  16*  --   --   --   GLUCOSE 100*  --  96  --  89  --  86  --  93  --   --  99  --   --   --   BUN 117*  --  122*  --  117*  --  127*  --  123*  --   --  122*  --   --   --   CREATININE 6.23*  --  5.94*  --  6.02*  --  6.05*  --  5.64*  --   --  5.46*  --   --   --   CALCIUM 6.6*  --  6.3*  --  6.0*  --  6.1*  --  6.0*  --   --  6.4*  --   --   --   MG 2.0  --  1.9  --  1.9  --  1.9  --  1.8  --   --   --   --   --   --   PHOS 10.0*  --  9.9*  --  10.0*  --  10.2*  --  9.0*  --   --   --   --   --   --    < > = values in this interval not displayed.   GFR: Estimated Creatinine Clearance: 11.4 mL/min (A) (by C-G formula based on SCr of 5.46 mg/dL (H)). Liver Function Tests: Recent Labs  Lab 06/09/20 1747 06/11/20 0257 06/11/20 1054 06/12/20 0313 06/13/20 0442  AST 28  --  24  --   --   ALT 12  --  14  --   --   ALKPHOS 87  --  82  --   --   BILITOT 0.6  --  0.6  --   --   PROT 6.5  --  5.7*  --   --   ALBUMIN 2.3* 2.0* 2.1* 2.0* 1.8*   No results for input(s): LIPASE, AMYLASE in the last 168 hours. No results for input(s): AMMONIA in the last 168 hours. Coagulation Profile: No results for input(s): INR, PROTIME in the last 168 hours. Cardiac Enzymes: No results for input(s): CKTOTAL, CKMB, CKMBINDEX, TROPONINI in the last 168 hours. BNP (last 3 results) No results for input(s): PROBNP in the last 8760 hours. HbA1C: No results for input(s): HGBA1C in the last 72 hours. CBG: Recent Labs  Lab 06/10/20 0019 06/11/20 0718 06/11/20 2144 06/15/20 2101  GLUCAP 121* 90 100* 120*   Lipid Profile: No results for input(s): CHOL, HDL, LDLCALC, TRIG, CHOLHDL, LDLDIRECT in the last 72 hours. Thyroid Function Tests: No results for input(s): TSH, T4TOTAL, FREET4, T3FREE, THYROIDAB in the last 72 hours. Anemia Panel: No results for input(s): VITAMINB12, FOLATE, FERRITIN, TIBC, IRON, RETICCTPCT in the last 72 hours. Sepsis Labs: Recent Labs  Lab  06/09/20 1747 06/11/20 1054 06/12/20 0313 06/13/20 0442  PROCALCITON  --  4.00 3.70 3.56  LATICACIDVEN 1.9  --   --   --     Recent Results (from the past 240 hour(s))  Resp Panel by RT-PCR (Flu A&B, Covid) Nasopharyngeal Swab     Status: None   Collection Time: 06/09/20  7:37 PM   Specimen: Nasopharyngeal Swab; Nasopharyngeal(NP) swabs in vial transport medium  Result Value Ref Range Status   SARS Coronavirus 2 by RT PCR NEGATIVE NEGATIVE Final    Comment: (NOTE) SARS-CoV-2 target nucleic acids are NOT DETECTED.  The SARS-CoV-2 RNA is generally detectable in upper respiratory specimens during the acute phase of infection. The lowest concentration of SARS-CoV-2 viral copies this assay can detect is 138 copies/mL. A negative result does not preclude SARS-Cov-2 infection and should not be used as the sole basis for treatment or other patient management decisions. A negative result may occur with  improper specimen collection/handling, submission of specimen other than  nasopharyngeal swab, presence of viral mutation(s) within the areas targeted by this assay, and inadequate number of viral copies(<138 copies/mL). A negative result must be combined with clinical observations, patient history, and epidemiological information. The expected result is Negative.  Fact Sheet for Patients:  EntrepreneurPulse.com.au  Fact Sheet for Healthcare Providers:  IncredibleEmployment.be  This test is no t yet approved or cleared by the [place] FDA and  has been authorized for detection and/or diagnosis of SARS-CoV-2 by FDA under an Emergency Use Authorization (EUA). This EUA will remain  in effect (meaning this test can be used) for the duration of the COVID-19 declaration under Section 564(b)(1) of the Act, 21 U.S.C.section 360bbb-3(b)(1), unless the authorization is terminated  or revoked sooner.       Influenza A by PCR NEGATIVE NEGATIVE Final    Influenza B by PCR NEGATIVE NEGATIVE Final    Comment: (NOTE) The Xpert Xpress SARS-CoV-2/FLU/RSV plus assay is intended as an aid in the diagnosis of influenza from Nasopharyngeal swab specimens and should not be used as a sole basis for treatment. Nasal washings and aspirates are unacceptable for Xpert Xpress SARS-CoV-2/FLU/RSV testing.  Fact Sheet for Patients: EntrepreneurPulse.com.au  Fact Sheet for Healthcare Providers: IncredibleEmployment.be  This test is not yet approved or cleared by the [place] FDA and has been authorized for detection and/or diagnosis of SARS-CoV-2 by FDA under an Emergency Use Authorization (EUA). This EUA will remain in effect (meaning this test can be used) for the duration of the COVID-19 declaration under Section 564(b)(1) of the Act, 21 U.S.C. section 360bbb-3(b)(1), unless the authorization is terminated or revoked.  Performed at [facility], [address] [place], [place] [address]   MRSA PCR Screening     Status: None   Collection Time: 06/10/20 12:24 AM   Specimen: Nasopharyngeal  Result Value Ref Range Status   MRSA by PCR NEGATIVE NEGATIVE Final    Comment:        The GeneXpert MRSA Assay (FDA approved for NASAL specimens only), is one component of a comprehensive MRSA colonization surveillance program. It is not intended to diagnose MRSA infection nor to guide or monitor treatment for MRSA infections. Performed at [facility], [address] [place], [place] [address]   Urine Culture     Status: None   Collection Time: 06/10/20  6:06 AM   Specimen: Urine, Random  Result Value Ref Range Status   Specimen Description   Final    URINE, RANDOM Performed at [facility], [address] [place], [place] [address]    Special Requests   Final    NONE Performed at [facility], [address] [place], [place] [address]    Culture   Final    NO  GROWTH Performed at [facility], [place] [address], [place], [place] [address]    Report Status 06/12/2020 FINAL  Final  CULTURE, BLOOD (ROUTINE X 2) w Reflex to ID Panel     Status: None   Collection Time: 06/11/20 10:54 AM   Specimen: BLOOD RIGHT HAND  Result Value Ref Range Status   Specimen Description BLOOD RIGHT HAND  Final   Special Requests   Final    BOTTLES DRAWN AEROBIC AND ANAEROBIC Blood Culture results may not be optimal due to an excessive volume of blood received in culture bottles   Culture   Final    NO GROWTH 5 DAYS Performed at [facility], [address] [place], [place]  [address]    Report Status 06/16/2020 FINAL  Final  CULTURE, BLOOD (ROUTINE X 2) w Reflex to ID Panel     Status: None   Collection Time: 06/11/20 11:04 AM   Specimen: BLOOD RIGHT ARM  Result Value Ref Range Status   Specimen Description BLOOD RIGHT ARM  Final   Special Requests   Final    BOTTLES DRAWN AEROBIC AND ANAEROBIC Blood Culture results may not be optimal due to an excessive volume of blood received in culture bottles   Culture   Final    NO GROWTH 5 DAYS Performed at [facility], [address], [place], [place] [address]    Report Status 06/16/2020 FINAL  Final         Radiology Studies: No results found.      Scheduled Meds: . Chlorhexidine Gluconate Cloth  6 each Topical Q0600  . heparin  5,000 Units Subcutaneous Q8H  . metoCLOPramide (REGLAN) injection  5 mg Intravenous Q8H  . mycophenolate  500 mg Oral BID  . pantoprazole (PROTONIX) IV  40 mg Intravenous QHS  . sevelamer carbonate  800 mg Oral TID WC  . sodium bicarbonate  650 mg Oral TID  . sodium chloride  1 g Oral BID WC   Continuous Infusions: . sodium chloride Stopped (06/16/20 0352)    Assessment & Plan:   Active Problems:   Hyponatremia   Severe hyponatremia and hypochloremia likely diuretic induced Anion gap metabolic acidosis  Seizure due to hyponatremia Likely due  to excessive diuresis Seizure precautions as pt presented with seizure.   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 EM with membranous pattern and extraglomerular immune deposits as well as some inflammation and fibrosis in the interstitium by EM. Favoring autoimmune process." Was on Cell cept, plaquenil and prednisone as outpatient  4/12-continues to have low renal function N.p.o. at midnight Vascular will place HD permacath in a.m., moving towards dialysis Nephrology following  Leukocytosis Concern for Left Basilar Pneumonia on CXR 06/11/20 -Monitor fever curve  -Trend WBC's and Procalcitonin -Procalcitonin on downward trend however still 3.56 -Follow cultures as above (strep pneumo and Legionella are negative, urine is negative, blood cultures still pending) -Continue Azithromycin + Rocephin (will plan for 5 day course) pending cultures and sensitivities   Hyperglycemia  -BG level stable Continue Riss    DVT prophylaxis: Heparin subcu Code Status: Full Family Communication: Husband at bedside  Status is: Inpatient  Remains inpatient appropriate because:Inpatient level of care appropriate due to severity of illness   Dispo: The patient is from: Home              Anticipated d/c is to: Home              Patient currently is not medically  stable to d/c.   Difficult to place patient No  LOS: 7 days   Time spent: 35 minutes with more than 50% on [place], MD [facility] Pager [phone]  If 7PM-7AM, please contact night-coverage 06/16/2020, 3:19 PM

## 2020-06-16 NOTE — Plan of Care (Addendum)
Pt Axox4. Calm and cooperative and able to voice her needs. Mostly Spanish speaker. Pt able to ambulate using rolling walker to bathroom. Na level being monitored closely. Calcium level low (6.4) this am and NP Randol Kern gave order for replacement. Vitals stable. On RA. Safety measures in place. Will continue to monitor.  Problem: Education: Goal: Knowledge of General Education information will improve Description: Including pain rating scale, medication(s)/side effects and non-pharmacologic comfort measures Outcome: Progressing   Problem: Health Behavior/Discharge Planning: Goal: Ability to manage health-related needs will improve Outcome: Progressing   Problem: Clinical Measurements: Goal: Ability to maintain clinical measurements within normal limits will improve Outcome: Progressing Goal: Will remain free from infection Outcome: Progressing Goal: Diagnostic test results will improve Outcome: Progressing Goal: Respiratory complications will improve Outcome: Progressing Goal: Cardiovascular complication will be avoided Outcome: Progressing   Problem: Activity: Goal: Risk for activity intolerance will decrease Outcome: Progressing   Problem: Nutrition: Goal: Adequate nutrition will be maintained Outcome: Progressing   Problem: Coping: Goal: Level of anxiety will decrease Outcome: Progressing   Problem: Elimination: Goal: Will not experience complications related to bowel motility Outcome: Progressing Goal: Will not experience complications related to urinary retention Outcome: Progressing   Problem: Pain Managment: Goal: General experience of comfort will improve Outcome: Progressing   Problem: Safety: Goal: Ability to remain free from injury will improve Outcome: Progressing   Problem: Skin Integrity: Goal: Risk for impaired skin integrity will decrease Outcome: Progressing

## 2020-06-16 NOTE — Progress Notes (Signed)
[place], [place] 06/16/20  Subjective:   Hospital day # 7  Patient sitting at bedside eating breakfast Alert and oriented Husband at bedside  Denies nausea and shortness of breath   04/11 0701 - 04/12 0700 In: 761.2 [P.O.:682; I.V.:42.7; IV Piggyback:36.5] Out: 350 [Urine:350] Lab Results  Component Value Date   CREATININE 5.46 (H) 06/16/2020   CREATININE 5.64 (H) 06/15/2020   CREATININE 6.05 (H) 06/14/2020     Objective:  Vital signs in last 24 hours:  Temp:  [98 F (36.7 C)-98.7 F (37.1 C)] 98.6 F (37 C) (04/12 0749) Pulse Rate:  [79-95] 86 (04/12 0749) Resp:  [16-20] 16 (04/12 0749) BP: (129-147)/(66-84) 134/73 (04/12 0749) SpO2:  [93 %-100 %] 99 % (04/12 0749)  Weight change:  Filed Weights   06/11/20 0500 06/12/20 0500 06/13/20 0500  Weight: 73.2 kg 75 kg 75.9 kg    Intake/Output:    Intake/Output Summary (Last 24 hours) at 06/16/2020 1303 Last data filed at 06/16/2020 0900 Gross per 24 hour  Intake 442 ml  Output --  Net 442 ml    Physical Exam: General:  No acute distress, laying in the bed  HEENT  moist oral mucous membrane  Pulm/lungs  normal breathing effort, lungs are clear to auscultation  CVS/Heart  regular rhythm, no rub or gallop  Abdomen:   Soft, nontender  Extremities:  No peripheral edema  Neurologic:  Awake, alert, conversant  Skin:  No acute rashes    Basic Metabolic Panel:  Recent Labs  Lab 06/11/20 0257 06/11/20 0639 06/12/20 0313 06/12/20 0634 06/13/20 0442 06/13/20 0102 06/14/20 0716 06/14/20 1105 06/15/20 0309 06/15/20 0659 06/15/20 1900 06/16/20 0015 06/16/20 0250 06/16/20 0720 06/16/20 1150  NA 109*  110*   < > 114*   < > 120*   < > 124*   < > 126*   < > 127* 127* 126* 127* 126*  K 4.0  --  3.9  --  3.9  --  5.2*  --  4.6  --   --   --  3.9  --   --   CL 75*  --  80*  --  89*  --  96*  --  96*  --   --   --  96*  --   --   CO2 18*  --  17*  --  16*  --  13*  --  15*  --   --    --  16*  --   --   GLUCOSE 100*  --  96  --  89  --  86  --  93  --   --   --  99  --   --   BUN 117*  --  122*  --  117*  --  127*  --  123*  --   --   --  122*  --   --   CREATININE 6.23*  --  5.94*  --  6.02*  --  6.05*  --  5.64*  --   --   --  5.46*  --   --   CALCIUM 6.6*  --  6.3*  --  6.0*  --  6.1*  --  6.0*  --   --   --  6.4*  --   --   MG 2.0  --  1.9  --  1.9  --  1.9  --  1.8  --   --   --   --   --   --  PHOS 10.0*  --  9.9*  --  10.0*  --  10.2*  --  9.0*  --   --   --   --   --   --    < > = values in this interval not displayed.     CBC: Recent Labs  Lab 06/09/20 1747 06/09/20 1937 06/11/20 1054 06/12/20 0313 06/13/20 0442 06/13/20 1608 06/14/20 0716 06/15/20 0309 06/16/20 0250  WBC 12.7*   < > 19.1* 17.1* 11.3*  --  11.5* 8.8 7.3  NEUTROABS 11.7*  --  17.7*  --   --   --   --   --   --   HGB 8.8*   < > 7.8* 7.1* 6.5* 7.9* 8.5* 7.8* 7.5*  HCT 24.0*   < > 21.3* 20.0* 18.8* 22.9* 23.5* 21.2* 21.9*  MCV 74.5*   < > 75.5* 77.5* 79.7*  --  78.9* 77.9* 81.7  PLT 440*   < > 365 301 296  --  319 290 292   < > = values in this interval not displayed.     No results found for: HEPBSAG, HEPBSAB, HEPBIGM    Microbiology:  Recent Results (from the past 240 hour(s))  Resp Panel by RT-PCR (Flu A&B, Covid) Nasopharyngeal Swab     Status: None   Collection Time: 06/09/20  7:37 PM   Specimen: Nasopharyngeal Swab; Nasopharyngeal(NP) swabs in vial transport medium  Result Value Ref Range Status   SARS Coronavirus 2 by RT PCR NEGATIVE NEGATIVE Final    Comment: (NOTE) SARS-CoV-2 target nucleic acids are NOT DETECTED.  The SARS-CoV-2 RNA is generally detectable in upper respiratory specimens during the acute phase of infection. The lowest concentration of SARS-CoV-2 viral copies this assay can detect is 138 copies/mL. A negative result does not preclude SARS-Cov-2 infection and should not be used as the sole basis for treatment or other patient management decisions.  A negative result may occur with  improper specimen collection/handling, submission of specimen other than nasopharyngeal swab, presence of viral mutation(s) within the areas targeted by this assay, and inadequate number of viral copies(<138 copies/mL). A negative result must be combined with clinical observations, patient history, and epidemiological information. The expected result is Negative.  Fact Sheet for Patients:  EntrepreneurPulse.com.au  Fact Sheet for Healthcare Providers:  IncredibleEmployment.be  This test is no t yet approved or cleared by the [place] FDA and  has been authorized for detection and/or diagnosis of SARS-CoV-2 by FDA under an Emergency Use Authorization (EUA). This EUA will remain  in effect (meaning this test can be used) for the duration of the COVID-19 declaration under Section 564(b)(1) of the Act, 21 U.S.C.section 360bbb-3(b)(1), unless the authorization is terminated  or revoked sooner.       Influenza A by PCR NEGATIVE NEGATIVE Final   Influenza B by PCR NEGATIVE NEGATIVE Final    Comment: (NOTE) The Xpert Xpress SARS-CoV-2/FLU/RSV plus assay is intended as an aid in the diagnosis of influenza from Nasopharyngeal swab specimens and should not be used as a sole basis for treatment. Nasal washings and aspirates are unacceptable for Xpert Xpress SARS-CoV-2/FLU/RSV testing.  Fact Sheet for Patients: EntrepreneurPulse.com.au  Fact Sheet for Healthcare Providers: IncredibleEmployment.be  This test is not yet approved or cleared by the [place] FDA and has been authorized for detection and/or diagnosis of SARS-CoV-2 by FDA under an Emergency Use Authorization (EUA). This EUA will remain in effect (meaning this test can be used) for the duration of  the COVID-19 declaration under Section 564(b)(1) of the Act, 21 U.S.C. section 360bbb-3(b)(1), unless the authorization  is terminated or revoked.  Performed at [facility], [address] [place], [place] [address]   MRSA PCR Screening     Status: None   Collection Time: 06/10/20 12:24 AM   Specimen: Nasopharyngeal  Result Value Ref Range Status   MRSA by PCR NEGATIVE NEGATIVE Final    Comment:        The GeneXpert MRSA Assay (FDA approved for NASAL specimens only), is one component of a comprehensive MRSA colonization surveillance program. It is not intended to diagnose MRSA infection nor to guide or monitor treatment for MRSA infections. Performed at [facility], [address] [place], [place] [address]   Urine Culture     Status: None   Collection Time: 06/10/20  6:06 AM   Specimen: Urine, Random  Result Value Ref Range Status   Specimen Description   Final    URINE, RANDOM Performed at [facility], [address], [place], [place] [address]    Special Requests   Final    NONE Performed at [facility], [address] [place], [place] [address]    Culture   Final    NO GROWTH Performed at [facility], [place] [address], [place], [place] [address]    Report Status 06/12/2020 FINAL  Final  CULTURE, BLOOD (ROUTINE X 2) w Reflex to ID Panel     Status: None   Collection Time: 06/11/20 10:54 AM   Specimen: BLOOD RIGHT HAND  Result Value Ref Range Status   Specimen Description BLOOD RIGHT HAND  Final   Special Requests   Final    BOTTLES DRAWN AEROBIC AND ANAEROBIC Blood Culture results may not be optimal due to an excessive volume of blood received in culture bottles   Culture   Final    NO GROWTH 5 DAYS Performed at [facility], [address] [place], [place] [address]    Report Status 06/16/2020 FINAL  Final  CULTURE, BLOOD (ROUTINE X 2) w Reflex to ID Panel     Status: None   Collection Time: 06/11/20 11:04 AM   Specimen: BLOOD RIGHT ARM  Result Value Ref Range Status   Specimen Description BLOOD RIGHT ARM  Final    Special Requests   Final    BOTTLES DRAWN AEROBIC AND ANAEROBIC Blood Culture results may not be optimal due to an excessive volume of blood received in culture bottles   Culture   Final    NO GROWTH 5 DAYS Performed at [facility], [address] [place], [place] [address]    Report Status 06/16/2020 FINAL  Final    Coagulation Studies: No results for input(s): LABPROT, INR in the last 72 hours.  Urinalysis: No results for input(s): COLORURINE, LABSPEC, PHURINE, GLUCOSEU, HGBUR, BILIRUBINUR, KETONESUR, PROTEINUR, UROBILINOGEN, NITRITE, LEUKOCYTESUR in the last 72 hours.  Invalid input(s): APPERANCEUR    Imaging: No results found.   Medications:   . sodium chloride 10 mL/hr at 06/15/20 1200   . Chlorhexidine Gluconate Cloth  6 each Topical Q0600  . heparin  5,000 Units Subcutaneous Q8H  . metoCLOPramide (REGLAN) injection  5 mg Intravenous Q8H  . mycophenolate  500 mg Oral BID  . pantoprazole (PROTONIX) IV  40 mg Intravenous QHS  . sevelamer carbonate  800 mg Oral TID WC  . sodium bicarbonate  650 mg Oral TID  . sodium chloride  1  g Oral BID WC   sodium chloride, acetaminophen, docusate sodium, LORazepam, ondansetron (ZOFRAN) IV, polyethylene glycol  Assessment/ Plan:  55 y.o. 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 EM with membranous pattern and extraglomerular immune deposits  as well as some inflammation and fibrosis in the interstitium by EM. Favoring autoimmune process."  Was on Cell cept, plaquenil and prednisone as outpatient  -Continues to have quite low renal function with a EGFR of 8.   It was discussed with the patient and family about the need for renal replacement therapy.  Patient and family have decided to move forward with dialysis NPO past midnight tonight Vascular will place HD permcath tomorrow  #Hypertension  BP Readings from Last 1 Encounters:  06/16/20 134/73  Acceptable control for this patient No hypertensive's currently   #Hyperphosphatemia likely due to secondary hyperparathyroidism Received IV calcium 2g for hypocalcemia this mroning.   Renvela with meals   LOS: Carol Hicks 4/12/20221:03 PM  [place], [place]  Note: This note was prepared with Dragon dictation. Any transcription errors are unintentional

## 2020-06-16 NOTE — Progress Notes (Signed)
Per Nursing Director okay for daughter to spend the night since patient only speaks spanish.

## 2020-06-17 ENCOUNTER — Encounter
Admission: EM | Disposition: A | Discharge status: Home Health | Payer: Self-pay | Source: Home / Self Care | Attending: Internal Medicine

## 2020-06-17 ENCOUNTER — Encounter: Payer: Self-pay | Admitting: Vascular Surgery

## 2020-06-17 ENCOUNTER — Inpatient Hospital Stay (HOSPITAL_COMMUNITY)
Admit: 2020-06-17 | Discharge: 2020-06-17 | Disposition: A | Discharge status: Home / Self Care | Payer: BC Managed Care – PPO | Attending: Internal Medicine | Admitting: Internal Medicine

## 2020-06-17 DIAGNOSIS — R011 Cardiac murmur, unspecified: Secondary | ICD-10-CM

## 2020-06-17 DIAGNOSIS — R739 Hyperglycemia, unspecified: Secondary | ICD-10-CM

## 2020-06-17 DIAGNOSIS — N185 Chronic kidney disease, stage 5: Secondary | ICD-10-CM

## 2020-06-17 HISTORY — PX: DIALYSIS/PERMA CATHETER INSERTION: CATH118288

## 2020-06-17 LAB — BASIC METABOLIC PANEL
Anion gap: 15 (ref 5–15)
BUN: 123 mg/dL — ABNORMAL HIGH (ref 6–20)
CO2: 16 mmol/L — ABNORMAL LOW (ref 22–32)
Calcium: 6.8 mg/dL — ABNORMAL LOW (ref 8.9–10.3)
Chloride: 96 mmol/L — ABNORMAL LOW (ref 98–111)
Creatinine, Ser: 5.49 mg/dL — ABNORMAL HIGH (ref 0.44–1.00)
GFR, Estimated: 9 mL/min — ABNORMAL LOW (ref >60)
Glucose, Bld: 90 mg/dL (ref 70–99)
Potassium: 3.9 mmol/L (ref 3.5–5.1)
Sodium: 127 mmol/L — ABNORMAL LOW (ref 135–145)

## 2020-06-17 LAB — SODIUM
Sodium: 129 mmol/L — ABNORMAL LOW (ref 135–145)
Sodium: 129 mmol/L — ABNORMAL LOW (ref 135–145)
Sodium: 129 mmol/L — ABNORMAL LOW (ref 135–145)

## 2020-06-17 LAB — CBC
HCT: 21.9 % — ABNORMAL LOW (ref 36.0–46.0)
Hemoglobin: 7.4 g/dL — ABNORMAL LOW (ref 12.0–15.0)
MCH: 27.9 pg (ref 26.0–34.0)
MCHC: 33.8 g/dL (ref 30.0–36.0)
MCV: 82.6 fL (ref 80.0–100.0)
Platelets: 267 10*3/uL (ref 150–400)
RBC: 2.65 MIL/uL — ABNORMAL LOW (ref 3.87–5.11)
RDW: 12.9 % (ref 11.5–15.5)
WBC: 5.4 10*3/uL (ref 4.0–10.5)
nRBC: 0 % (ref 0.0–0.2)

## 2020-06-17 SURGERY — DIALYSIS/PERMA CATHETER INSERTION
Anesthesia: Moderate Sedation

## 2020-06-17 MED ORDER — SODIUM CHLORIDE 0.9 % IV SOLN: 100.0000 mL | INTRAVENOUS | Status: DC | PRN

## 2020-06-17 MED ORDER — LIDOCAINE HCL (PF) 1 % IJ SOLN
5.0000 mL | INTRAMUSCULAR | Status: DC | PRN
  Filled 2020-06-17: qty 5

## 2020-06-17 MED ORDER — MIDAZOLAM HCL 5 MG/5ML IJ SOLN
INTRAMUSCULAR | Status: AC
  Filled 2020-06-17: qty 5

## 2020-06-17 MED ORDER — HEPARIN SODIUM (PORCINE) 1000 UNIT/ML DIALYSIS: 1000.0000 [IU] | INTRAMUSCULAR | Status: DC | PRN

## 2020-06-17 MED ORDER — FAMOTIDINE 20 MG PO TABS: 40.0000 mg | ORAL_TABLET | Freq: Once | ORAL | Status: DC | PRN

## 2020-06-17 MED ORDER — DIPHENHYDRAMINE HCL 50 MG/ML IJ SOLN: 50.0000 mg | Freq: Once | INTRAMUSCULAR | Status: DC | PRN

## 2020-06-17 MED ORDER — SODIUM CHLORIDE 0.9 % IV SOLN: INTRAVENOUS | Status: DC

## 2020-06-17 MED ORDER — CEFAZOLIN SODIUM-DEXTROSE 1-4 GM/50ML-% IV SOLN
INTRAVENOUS | Status: AC
  Administered 2020-06-17: 1 g via INTRAVENOUS
  Filled 2020-06-17: qty 50

## 2020-06-17 MED ORDER — HEPARIN SODIUM (PORCINE) 1000 UNIT/ML IJ SOLN
INTRAMUSCULAR | Status: AC
  Filled 2020-06-17: qty 1

## 2020-06-17 MED ORDER — MIDAZOLAM HCL 2 MG/ML PO SYRP: 8.0000 mg | ORAL_SOLUTION | Freq: Once | ORAL | Status: DC | PRN

## 2020-06-17 MED ORDER — FENTANYL CITRATE (PF) 100 MCG/2ML IJ SOLN
INTRAMUSCULAR | Status: AC
  Filled 2020-06-17: qty 2

## 2020-06-17 MED ORDER — HEPARIN SODIUM (PORCINE) 1000 UNIT/ML IJ SOLN
4200.0000 [IU] | Freq: Once | INTRAMUSCULAR | Status: AC
  Administered 2020-06-17: 4200 [IU]
  Filled 2020-06-17: qty 4.2

## 2020-06-17 MED ORDER — METHYLPREDNISOLONE SODIUM SUCC 125 MG IJ SOLR: 125.0000 mg | Freq: Once | INTRAMUSCULAR | Status: DC | PRN

## 2020-06-17 MED ORDER — LIDOCAINE-PRILOCAINE 2.5-2.5 % EX CREA
1.0000 "application " | TOPICAL_CREAM | CUTANEOUS | Status: DC | PRN
  Filled 2020-06-17: qty 5

## 2020-06-17 MED ORDER — CEFAZOLIN SODIUM-DEXTROSE 1-4 GM/50ML-% IV SOLN: 1.0000 g | Freq: Once | INTRAVENOUS | Status: AC

## 2020-06-17 MED ORDER — ALTEPLASE 2 MG IJ SOLR: 2.0000 mg | Freq: Once | INTRAMUSCULAR | Status: DC | PRN

## 2020-06-17 MED ORDER — HYDROMORPHONE HCL 1 MG/ML IJ SOLN
1.0000 mg | Freq: Once | INTRAMUSCULAR | Status: DC | PRN
Start: 2020-06-17 — End: 2020-06-22

## 2020-06-17 MED ORDER — FENTANYL CITRATE (PF) 100 MCG/2ML IJ SOLN
INTRAMUSCULAR | Status: DC | PRN
  Administered 2020-06-17: 25 ug via INTRAVENOUS

## 2020-06-17 MED ORDER — ONDANSETRON HCL 4 MG/2ML IJ SOLN: 4.0000 mg | Freq: Four times a day (QID) | INTRAMUSCULAR | Status: DC | PRN

## 2020-06-17 MED ORDER — MIDAZOLAM HCL 2 MG/2ML IJ SOLN
INTRAMUSCULAR | Status: DC | PRN
  Administered 2020-06-17: 1 mg via INTRAVENOUS

## 2020-06-17 MED ORDER — PENTAFLUOROPROP-TETRAFLUOROETH EX AERO
1.0000 "application " | INHALATION_SPRAY | CUTANEOUS | Status: DC | PRN
  Filled 2020-06-17: qty 30

## 2020-06-17 SURGICAL SUPPLY — 8 items
BIOPATCH RED 1 DISK 7.0 (GAUZE/BANDAGES/DRESSINGS) ×2 IMPLANT
CATH CANNON HEMO 15FR 19 (HEMODIALYSIS SUPPLIES) ×2 IMPLANT
DERMABOND ADVANCED (GAUZE/BANDAGES/DRESSINGS) ×1
DERMABOND ADVANCED .7 DNX12 (GAUZE/BANDAGES/DRESSINGS) ×1 IMPLANT
PACK ANGIOGRAPHY (CUSTOM PROCEDURE TRAY) ×2 IMPLANT
SUT MNCRL AB 4-0 PS2 18 (SUTURE) ×2 IMPLANT
SUT PROLENE 0 CT 1 30 (SUTURE) ×2 IMPLANT
TOWEL OR 17X26 4PK STRL BLUE (TOWEL DISPOSABLE) ×2 IMPLANT

## 2020-06-17 NOTE — Progress Notes (Signed)
Report given to Dialysis RN; transported to dialysis. Barbaraann Faster, RN

## 2020-06-17 NOTE — Plan of Care (Signed)
Pt Carol Hicks. Calm and cooperative and able to voice her needs. Mostly Spanish speaker. Pt able to ambulate using rolling walker to bathroom. Na level being monitored closely. Latest Na level is 127. NPO since MN for permcath placement today. Husband to arrive at 0700am this am to sign consent. Pt daughter at bedside and only her can stay each night per administration permission. Vitals stable. On RA. Safety measures in place. Will continue to monitor.  Problem: Education: Goal: Knowledge of General Education information will improve Description: Including pain rating scale, medication(s)/side effects and non-pharmacologic comfort measures Outcome: Progressing   Problem: Health Behavior/Discharge Planning: Goal: Ability to manage health-related needs will improve Outcome: Progressing   Problem: Clinical Measurements: Goal: Ability to maintain clinical measurements within normal limits will improve Outcome: Progressing Goal: Will remain free from infection Outcome: Progressing Goal: Diagnostic test results will improve Outcome: Progressing Goal: Respiratory complications will improve Outcome: Progressing Goal: Cardiovascular complication will be avoided Outcome: Progressing   Problem: Activity: Goal: Risk for activity intolerance will decrease Outcome: Progressing   Problem: Nutrition: Goal: Adequate nutrition will be maintained Outcome: Progressing   Problem: Coping: Goal: Level of anxiety will decrease Outcome: Progressing   Problem: Elimination: Goal: Will not experience complications related to bowel motility Outcome: Progressing Goal: Will not experience complications related to urinary retention Outcome: Progressing   Problem: Pain Managment: Goal: General experience of comfort will improve Outcome: Progressing   Problem: Safety: Goal: Ability to remain free from injury will improve Outcome: Progressing   Problem: Skin Integrity: Goal: Risk for impaired skin  integrity will decrease Outcome: Progressing

## 2020-06-17 NOTE — Progress Notes (Addendum)
PROGRESS NOTE    Carol Hicks  [id number] DOB: 1966/01/21 DOA: 06/09/2020 PCP: Pcp, No    Brief Narrative:  This is a 55 yo female who presented to [facility] ER on 04/5 from home with seizure activity.  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  There is concern pt may be close to ESRD and dialysis and/or kidney transplant.  During a previous hospitalization she was started on bumex 2 mg bid and sodium bicarb.  She was recently diagnosed with a viral URI with recommendations of supportive care and close outpatient follow-up on 03/25.  Pts daughter reported the pt has not been "acting the same" for about 2 weeks.  She also reported the pt went from a lying position to a seated position she had a seizure today that lasted for about 2-3 minutes. During the seizure the pts eyes rolled back and both her arms were shaking.  Following the seizure the pt appeared very sleepy for about 5-10 minutes.  EMS reported the pts vital signs were stable when they arrived.  ED Course Upon arrival to the ER pts daughter reported the pt appeared at her baseline, but the pt had no recollection of what happened prior to arrival in the ER.  Pt reported she has had emesis when taking her sodium bicarb tablet and loose stools over the past 24-36 hrs.  She also reported insomnia, on average only able to sleep 1-2 hrs per night over the past 2 weeks.  Lab results revealed Na+ 105, chloride 69, CO2 18, glucose 150, BUN 111, creatinine 6.24, calcium 6.9, anion gap 18, albumin 2.3, troponin 20, wbc 12.7, and hgb 8.8, platelets 440.  CT Head negative for acute intracranial findings or mass lesions.  She received 500 ml LR bolus.  ER physician contacted on call nephrologist Dr. Candiss Norse and he recommended the following: 3% saline 50 ml bolus x1 to raise Na+ above 110 with slow correction thereafter (goal of  ~8 Meq correction in 24hrs).  Received 100 ml bolus of hypertonic saline per ER physician.   Pt admitted to ICU with hyponatremia Na 105 received 3% saline 50 ml bolus  06/10/20: Remained on 3% Hypertonic saline, latest Na 112 this afternoon (Na 105 upon presentation @ 18:00 yesterday); per discussion with Nephrology will hold 3% Hypertonic saline and follow correction with PO intake 06/11/20: Serum Na remains low, placed on NS overnight. Placing back on 3% Saline by Nephrology, increasing WBC 22.9, CXR with left basilar opacity concerning for atelectasis vs. PNA, placed on empiric Azithromycin + Rocephin, cultures obtained 06/11/20: Serum Na remains below 120 (115), Hypertonic saline increased by Nephrology, Strep pneumo urinary antigen negative, all other cultures still pending.  06/13/2020: Patient noted to have significant anemia, consent obtained for transfusion.  Sodium 122, continue 3% saline at reduced dose of 20 mL/h. 06/14/2020: Transition from IV 3% hypertonic saline to p.o. saline tablets, 06/15/2020: Remains on PO salt tablets, Na slowly improving, up to 126 on am labs; consider transfer to Med-Surg if Nephrology on board, will transfer service to [facility] tomorrow  4/12-PCCM pickup today 4/13-plan for HD cath placement by vascular today    Consultants:   Nephrology, PCCM, vascular  Procedures:   Antimicrobials:  06/11/20: Blood culture x2>>no growth to date 06/11/20: Sputum>> 06/11/20: Strep pneumo urinary antigen>> negative 06/11/20: Legionella urinary antigen>>negative 06/11/20: Mycoplasma pneumoniae >>  06/11/20: Pneumocystis PCR>> 06/11/20: Urine>>no growth  Azithromycin 4/7>> Ceftriaxone 4/7>>  Subjective: Family at bedside.  States her mental status has improved less sleepy.  Eating well.  Ambulating okay.  No complaints  Objective: Vitals:   06/16/20 1550 06/16/20 2112 06/17/20 0109 06/17/20 0422  BP: 135/75 128/79 130/68 124/63  Pulse: 85 87 86 77  Resp: '15 20 18 18  '$ Temp: 97.9 F  (36.6 C) 98 F (36.7 C) 98.1 F (36.7 C) 98.2 F (36.8 C)  TempSrc: Oral Oral Oral Oral  SpO2: 100% 96% 100% 99%  Weight:    81.5 kg  Height:        Intake/Output Summary (Last 24 hours) at 06/17/2020 0826 Last data filed at 06/16/2020 1700 Gross per 24 hour  Intake 378.53 ml  Output --  Net 378.53 ml   Filed Weights   06/12/20 0500 06/13/20 0500 06/17/20 0422  Weight: 75 kg 75.9 kg 81.5 kg    Examination: Calm, comfortable CTA no wheeze rales rhonchi's Regular S1-S2 no gallops 2/6 early peaking sm  Soft benign positive bowel sounds Positive edema bilaterally lower legs Grossly intact Mood and affect appropriate in current setting   Data Reviewed: I have personally reviewed following labs and imaging studies  CBC: Recent Labs  Lab 06/11/20 1054 06/12/20 0313 06/13/20 0442 06/13/20 1608 06/14/20 0716 06/15/20 0309 06/16/20 0250 06/17/20 0249  WBC 19.1*   < > 11.3*  --  11.5* 8.8 7.3 5.4  NEUTROABS 17.7*  --   --   --   --   --   --   --   HGB 7.8*   < > 6.5* 7.9* 8.5* 7.8* 7.5* 7.4*  HCT 21.3*   < > 18.8* 22.9* 23.5* 21.2* 21.9* 21.9*  MCV 75.5*   < > 79.7*  --  78.9* 77.9* 81.7 82.6  PLT 365   < > 296  --  319 290 292 267   < > = values in this interval not displayed.   Basic Metabolic Panel: Recent Labs  Lab 06/11/20 0257 06/11/20 0639 06/12/20 0313 06/12/20 [id number] 06/13/20 0442 06/13/20 [id number] 06/14/20 0716 06/14/20 1105 06/15/20 0309 06/15/20 0659 06/16/20 0250 06/16/20 0720 06/16/20 1456 06/16/20 1828 06/16/20 2206 06/17/20 0249 06/17/20 0717  NA 109*  110*   < > 114*   < > 120*   < > 124*   < > 126*   < > 126*   < > 128* 127* 127* 127* 129*  K 4.0  --  3.9  --  3.9  --  5.2*  --  4.6  --  3.9  --   --   --   --  3.9  --   CL 75*  --  80*  --  89*  --  96*  --  96*  --  96*  --   --   --   --  96*  --   CO2 18*  --  17*  --  16*  --  13*  --  15*  --  16*  --   --   --   --  16*  --   GLUCOSE 100*  --  96  --  89  --  86  --  93  --  99  --    --   --   --  90  --   BUN 117*  --  122*  --  117*  --  127*  --  123*  --  122*  --   --   --   --  123*  --   CREATININE 6.23*  --  5.94*  --  6.02*  --  6.05*  --  5.64*  --  5.46*  --   --   --   --  5.49*  --   CALCIUM 6.6*  --  6.3*  --  6.0*  --  6.1*  --  6.0*  --  6.4*  --   --   --   --  6.8*  --   MG 2.0  --  1.9  --  1.9  --  1.9  --  1.8  --   --   --   --   --   --   --   --   PHOS 10.0*  --  9.9*  --  10.0*  --  10.2*  --  9.0*  --   --   --   --   --   --   --   --    < > = values in this interval not displayed.   GFR: Estimated Creatinine Clearance: 11.7 mL/min (A) (by C-G formula based on SCr of 5.49 mg/dL (H)). Liver Function Tests: Recent Labs  Lab 06/11/20 0257 06/11/20 1054 06/12/20 0313 06/13/20 0442  AST  --  24  --   --   ALT  --  14  --   --   ALKPHOS  --  82  --   --   BILITOT  --  0.6  --   --   PROT  --  5.7*  --   --   ALBUMIN 2.0* 2.1* 2.0* 1.8*   No results for input(s): LIPASE, AMYLASE in the last 168 hours. No results for input(s): AMMONIA in the last 168 hours. Coagulation Profile: No results for input(s): INR, PROTIME in the last 168 hours. Cardiac Enzymes: No results for input(s): CKTOTAL, CKMB, CKMBINDEX, TROPONINI in the last 168 hours. BNP (last 3 results) No results for input(s): PROBNP in the last 8760 hours. HbA1C: No results for input(s): HGBA1C in the last 72 hours. CBG: Recent Labs  Lab 06/11/20 0718 06/11/20 2144 06/15/20 2101  GLUCAP 90 100* 120*   Lipid Profile: No results for input(s): CHOL, HDL, LDLCALC, TRIG, CHOLHDL, LDLDIRECT in the last 72 hours. Thyroid Function Tests: No results for input(s): TSH, T4TOTAL, FREET4, T3FREE, THYROIDAB in the last 72 hours. Anemia Panel: No results for input(s): VITAMINB12, FOLATE, FERRITIN, TIBC, IRON, RETICCTPCT in the last 72 hours. Sepsis Labs: Recent Labs  Lab 06/11/20 1054 06/12/20 0313 06/13/20 0442  PROCALCITON 4.00 3.70 3.56    Recent Results (from the past 240  hour(s))  Resp Panel by RT-PCR (Flu A&B, Covid) Nasopharyngeal Swab     Status: None   Collection Time: 06/09/20  7:37 PM   Specimen: Nasopharyngeal Swab; Nasopharyngeal(NP) swabs in vial transport medium  Result Value Ref Range Status   SARS Coronavirus 2 by RT PCR NEGATIVE NEGATIVE Final    Comment: (NOTE) SARS-CoV-2 target nucleic acids are NOT DETECTED.  The SARS-CoV-2 RNA is generally detectable in upper respiratory specimens during the acute phase of infection. The lowest concentration of SARS-CoV-2 viral copies this assay can detect is 138 copies/mL. A negative result does not preclude SARS-Cov-2 infection and should not be used as the sole basis for treatment or other patient management decisions. A negative result may occur with  improper specimen collection/handling, submission of specimen other than nasopharyngeal  swab, presence of viral mutation(s) within the areas targeted by this assay, and inadequate number of viral copies(<138 copies/mL). A negative result must be combined with clinical observations, patient history, and epidemiological information. The expected result is Negative.  Fact Sheet for Patients:  EntrepreneurPulse.com.au  Fact Sheet for Healthcare Providers:  IncredibleEmployment.be  This test is no t yet approved or cleared by the [place] FDA and  has been authorized for detection and/or diagnosis of SARS-CoV-2 by FDA under an Emergency Use Authorization (EUA). This EUA will remain  in effect (meaning this test can be used) for the duration of the COVID-19 declaration under Section 564(b)(1) of the Act, 21 U.S.C.section 360bbb-3(b)(1), unless the authorization is terminated  or revoked sooner.       Influenza A by PCR NEGATIVE NEGATIVE Final   Influenza B by PCR NEGATIVE NEGATIVE Final    Comment: (NOTE) The Xpert Xpress SARS-CoV-2/FLU/RSV plus assay is intended as an aid in the diagnosis of influenza from  Nasopharyngeal swab specimens and should not be used as a sole basis for treatment. Nasal washings and aspirates are unacceptable for Xpert Xpress SARS-CoV-2/FLU/RSV testing.  Fact Sheet for Patients: EntrepreneurPulse.com.au  Fact Sheet for Healthcare Providers: IncredibleEmployment.be  This test is not yet approved or cleared by the [place] FDA and has been authorized for detection and/or diagnosis of SARS-CoV-2 by FDA under an Emergency Use Authorization (EUA). This EUA will remain in effect (meaning this test can be used) for the duration of the COVID-19 declaration under Section 564(b)(1) of the Act, 21 U.S.C. section 360bbb-3(b)(1), unless the authorization is terminated or revoked.  Performed at [facility], [address] [place], [place] [address]   MRSA PCR Screening     Status: None   Collection Time: 06/10/20 12:24 AM   Specimen: Nasopharyngeal  Result Value Ref Range Status   MRSA by PCR NEGATIVE NEGATIVE Final    Comment:        The GeneXpert MRSA Assay (FDA approved for NASAL specimens only), is one component of a comprehensive MRSA colonization surveillance program. It is not intended to diagnose MRSA infection nor to guide or monitor treatment for MRSA infections. Performed at [facility], [address], [place], [place] [address]   Urine Culture     Status: None   Collection Time: 06/10/20  6:06 AM   Specimen: Urine, Random  Result Value Ref Range Status   Specimen Description   Final    URINE, RANDOM Performed at [facility], [address], [place], [place] [address]    Special Requests   Final    NONE Performed at [facility], [address], [place], [place] [address]    Culture   Final    NO GROWTH Performed at [facility], [place] [address] [place], [place] [address]    Report Status 06/12/2020 FINAL  Final  CULTURE, BLOOD (ROUTINE X 2) w Reflex  to ID Panel     Status: None   Collection Time: 06/11/20 10:54 AM   Specimen: BLOOD RIGHT HAND  Result Value Ref Range Status   Specimen Description BLOOD RIGHT HAND  Final   Special Requests   Final    BOTTLES DRAWN AEROBIC AND ANAEROBIC Blood Culture results may not be optimal due to an excessive volume of blood received in culture bottles   Culture   Final    NO GROWTH 5 DAYS Performed at [facility], [address], [place],  [address]  Report Status 06/16/2020 FINAL  Final  CULTURE, BLOOD (ROUTINE X 2) w Reflex to ID Panel     Status: None   Collection Time: 06/11/20 11:04 AM   Specimen: BLOOD RIGHT ARM  Result Value Ref Range Status   Specimen Description BLOOD RIGHT ARM  Final   Special Requests   Final    BOTTLES DRAWN AEROBIC AND ANAEROBIC Blood Culture results may not be optimal due to an excessive volume of blood received in culture bottles   Culture   Final    NO GROWTH 5 DAYS Performed at [facility], [address], [place], [place] [address]    Report Status 06/16/2020 FINAL  Final         Radiology Studies: No results found.      Scheduled Meds: . Chlorhexidine Gluconate Cloth  6 each Topical Q0600  . ferrous sulfate  325 mg Oral QODAY  . heparin  5,000 Units Subcutaneous Q8H  . metoCLOPramide (REGLAN) injection  5 mg Intravenous Q8H  . mycophenolate  500 mg Oral BID  . pantoprazole (PROTONIX) IV  40 mg Intravenous QHS  . sevelamer carbonate  800 mg Oral TID WC  . sodium bicarbonate  650 mg Oral TID  . sodium chloride  1 g Oral BID WC   Continuous Infusions: . sodium chloride Stopped (06/16/20 0352)    Assessment & Plan:   Active Problems:   Hyponatremia   Severe hyponatremia and hypochloremia likely diuretic induced Anion gap metabolic acidosis  Seizure due to hyponatremia Likely due to excessive diuresis Seizure precautions as pt presented with seizure.   CT head negative for acute issues Nephrology  following Received 500 cc LR in ED and 3% hypertonic saline small bolus and infusion off and on 4/13 sodium level improved today at 129  Continue sodium bicarb tablets     Mildly elevated troponin likely secondary to demand ischemia in the setting of severe hyponatremia  -Trend troponin (20 ~ 16) 4/13-asymptomatic.  No chest pain reported  Anemia without obvious acute blood loss  -Hemoglobin 7.4 this a.m.  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 EM with membranous pattern and extraglomerular immune deposits as well as some inflammation and fibrosis in the interstitium by EM. Favoring autoimmune process." Was on Cell cept, plaquenil and prednisone as outpatient  4/13- plan for HD permacath today by vascular  Moving towards initiation of dialysis due to low renal function     Leukocytosis Concern for Left Basilar Pneumonia on CXR 06/11/20 Leukocytosis improved Afebrile Blood cultures negative -Monitor fever curve  -Trend WBC's and Procalcitonin -Procalcitonin on downward trend however still 3.56 -Follow cultures as above (strep pneumo and Legionella are negative, urine is negative, blood cultures still pending) -Treated with azithromycin and Rocephin completed 5-day course    Hyperglycemia  -BG levels stable  Continue R-ISS BG level stable Continue Riss   Murmur- will obtain echo for further evaluation .   DVT prophylaxis: Heparin subcu Code Status: Full Family Communication: Husband and daughter at bedside  Status is: Inpatient  Remains inpatient appropriate because:Inpatient level of care appropriate due to severity of illness   Dispo: The patient is from: Home              Anticipated d/c is  to: Home               Patient currently is not medically stable to d/c.   Difficult to place patient No            LOS: 8 days   Time spent: 45 minutes with more than 50% on [place], MD [facility] Pager [phone]  If 7PM-7AM, please contact night-coverage 06/17/2020, 8:26 AM

## 2020-06-17 NOTE — Op Note (Signed)
OPERATIVE NOTE    PRE-OPERATIVE DIAGNOSIS: 1. ESRD   POST-OPERATIVE DIAGNOSIS: same as above  PROCEDURE: 1. Ultrasound guidance for vascular access to the right internal jugular vein 2. Fluoroscopic guidance for placement of catheter 3. Placement of a 19 cm tip to cuff tunneled hemodialysis catheter via the right internal jugular vein  SURGEON: Leotis Pain, MD  ANESTHESIA:  Local with Moderate conscious sedation for approximately 14 minutes using 1 mg of Versed and 25 mcg of Fentanyl  ESTIMATED BLOOD LOSS: 5 cc  FLUORO TIME: less than one minute  CONTRAST: none  FINDING(S): 1.  Patent right internal jugular vein  SPECIMEN(S):  None  INDICATIONS:   Carol Hicks is a 55 y.o.female who presents with renal failure.  The patient needs long term dialysis access for their ESRD, and a Permcath is necessary.  Risks and benefits are discussed and informed consent is obtained.    DESCRIPTION: After obtaining full informed written consent, the patient was brought back to the vascular suited. The patient's right neck and chest were sterilely prepped and draped in a sterile surgical field was created. Moderate conscious sedation was administered during a face to face encounter with the patient throughout the procedure with my supervision of the RN administering medicines and monitoring the patient's vital signs, pulse oximetry, telemetry and mental status throughout from the start of the procedure until the patient was taken to the recovery room.  The right internal jugular vein was visualized with ultrasound and found to be patent. It was then accessed under direct ultrasound guidance and a permanent image was recorded. A wire was placed. After skin nick and dilatation, the peel-away sheath was placed over the wire. I then turned my attention to an area under the clavicle. Approximately 1-2 fingerbreadths below the clavicle a small counterincision was created and tunneled from the subclavicular  incision to the access site. Using fluoroscopic guidance, a 19 centimeter tip to cuff tunneled hemodialysis catheter was selected, and tunneled from the subclavicular incision to the access site. It was then placed through the peel-away sheath and the peel-away sheath was removed. Using fluoroscopic guidance the catheter tips were parked in the right atrium. The appropriate distal connectors were placed. It withdrew blood well and flushed easily with heparinized saline and a concentrated heparin solution was then placed. It was secured to the chest wall with 2 Prolene sutures. The access incision was closed single 4-0 Monocryl. A 4-0 Monocryl pursestring suture was placed around the exit site. Sterile dressings were placed. The patient tolerated the procedure well and was taken to the recovery room in stable condition.  COMPLICATIONS: None  CONDITION: Stable  Leotis Pain, MD 06/17/2020 1:09 PM   This note was created with Dragon Medical transcription system. Any errors in dictation are purely unintentional.

## 2020-06-17 NOTE — Progress Notes (Signed)
[place], [place] 06/17/20  Subjective:   Hospital day # 8  Patient resting quietly in bed Spoke with patient and family with spanish interpretor Denies shortness of breath Tolerating meals but has poor appetite    04/12 0701 - 04/13 0700 In: 378.5 [P.O.:240; I.V.:138.5] Out: -  Lab Results  Component Value Date   CREATININE 5.49 (H) 06/17/2020   CREATININE 5.46 (H) 06/16/2020   CREATININE 5.64 (H) 06/15/2020     Objective:  Vital signs in last 24 hours:  Temp:  [97.5 F (36.4 C)-98.2 F (36.8 C)] 97.5 F (36.4 C) (04/13 1413) Pulse Rate:  [77-92] 79 (04/13 1413) Resp:  [14-20] 18 (04/13 1413) BP: (116-153)/(63-85) 119/78 (04/13 1413) SpO2:  [95 %-100 %] 100 % (04/13 1413) Weight:  [81.5 kg] 81.5 kg (04/13 0422)  Weight change:  Filed Weights   06/12/20 0500 06/13/20 0500 06/17/20 0422  Weight: 75 kg 75.9 kg 81.5 kg    Intake/Output:    Intake/Output Summary (Last 24 hours) at 06/17/2020 1416 Last data filed at 06/16/2020 1700 Gross per 24 hour  Intake 258.53 ml  Output --  Net 258.53 ml    Physical Exam: General:  No acute distress, laying in the bed  HEENT  moist oral mucous membrane  Pulm/lungs  normal breathing effort, clear to auscultation  CVS/Heart  regular rhythm, no rub or gallop  Abdomen:   Soft, nontender  Extremities:  No peripheral edema  Neurologic:  Awake, alert, conversant  Skin: No acute rashes  Access Rt IJ permcath placed 06/17/20     Basic Metabolic Panel:  Recent Labs  Lab 06/11/20 0257 06/11/20 0639 06/12/20 0313 06/12/20 0634 06/13/20 0442 06/13/20 5956 06/14/20 0716 06/14/20 1105 06/15/20 0309 06/15/20 0659 06/16/20 0250 06/16/20 0720 06/16/20 1456 06/16/20 1828 06/16/20 2206 06/17/20 0249 06/17/20 0717  NA 109*  110*   < > 114*   < > 120*   < > 124*   < > 126*   < > 126*   < > 128* 127* 127* 127* 129*  K 4.0  --  3.9  --  3.9  --  5.2*  --  4.6  --  3.9  --   --   --   --  3.9   --   CL 75*  --  80*  --  89*  --  96*  --  96*  --  96*  --   --   --   --  96*  --   CO2 18*  --  17*  --  16*  --  13*  --  15*  --  16*  --   --   --   --  16*  --   GLUCOSE 100*  --  96  --  89  --  86  --  93  --  99  --   --   --   --  90  --   BUN 117*  --  122*  --  117*  --  127*  --  123*  --  122*  --   --   --   --  123*  --   CREATININE 6.23*  --  5.94*  --  6.02*  --  6.05*  --  5.64*  --  5.46*  --   --   --   --  5.49*  --   CALCIUM 6.6*  --  6.3*  --  6.0*  --  6.1*  --  6.0*  --  6.4*  --   --   --   --  6.8*  --   MG 2.0  --  1.9  --  1.9  --  1.9  --  1.8  --   --   --   --   --   --   --   --   PHOS 10.0*  --  9.9*  --  10.0*  --  10.2*  --  9.0*  --   --   --   --   --   --   --   --    < > = values in this interval not displayed.     CBC: Recent Labs  Lab 06/11/20 1054 06/12/20 0313 06/13/20 0442 06/13/20 1608 06/14/20 0716 06/15/20 0309 06/16/20 0250 06/17/20 0249  WBC 19.1*   < > 11.3*  --  11.5* 8.8 7.3 5.4  NEUTROABS 17.7*  --   --   --   --   --   --   --   HGB 7.8*   < > 6.5* 7.9* 8.5* 7.8* 7.5* 7.4*  HCT 21.3*   < > 18.8* 22.9* 23.5* 21.2* 21.9* 21.9*  MCV 75.5*   < > 79.7*  --  78.9* 77.9* 81.7 82.6  PLT 365   < > 296  --  319 290 292 267   < > = values in this interval not displayed.     No results found for: HEPBSAG, HEPBSAB, HEPBIGM    Microbiology:  Recent Results (from the past 240 hour(s))  Resp Panel by RT-PCR (Flu A&B, Covid) Nasopharyngeal Swab     Status: None   Collection Time: 06/09/20  7:37 PM   Specimen: Nasopharyngeal Swab; Nasopharyngeal(NP) swabs in vial transport medium  Result Value Ref Range Status   SARS Coronavirus 2 by RT PCR NEGATIVE NEGATIVE Final    Comment: (NOTE) SARS-CoV-2 target nucleic acids are NOT DETECTED.  The SARS-CoV-2 RNA is generally detectable in upper respiratory specimens during the acute phase of infection. The lowest concentration of SARS-CoV-2 viral copies this assay can detect is 138  copies/mL. A negative result does not preclude SARS-Cov-2 infection and should not be used as the sole basis for treatment or other patient management decisions. A negative result may occur with  improper specimen collection/handling, submission of specimen other than nasopharyngeal swab, presence of viral mutation(s) within the areas targeted by this assay, and inadequate number of viral copies(<138 copies/mL). A negative result must be combined with clinical observations, patient history, and epidemiological information. The expected result is Negative.  Fact Sheet for Patients:  EntrepreneurPulse.com.au  Fact Sheet for Healthcare Providers:  IncredibleEmployment.be  This test is no t yet approved or cleared by the [place] FDA and  has been authorized for detection and/or diagnosis of SARS-CoV-2 by FDA under an Emergency Use Authorization (EUA). This EUA will remain  in effect (meaning this test can be used) for the duration of the COVID-19 declaration under Section 564(b)(1) of the Act, 21 U.S.C.section 360bbb-3(b)(1), unless the authorization is terminated  or revoked sooner.       Influenza A by PCR NEGATIVE NEGATIVE Final   Influenza B by PCR NEGATIVE NEGATIVE Final    Comment: (NOTE) The Xpert Xpress SARS-CoV-2/FLU/RSV plus assay is intended as an aid in the diagnosis of influenza from Nasopharyngeal swab specimens and should not be used as a sole basis for treatment. Nasal washings and aspirates are  unacceptable for Xpert Xpress SARS-CoV-2/FLU/RSV testing.  Fact Sheet for Patients: EntrepreneurPulse.com.au  Fact Sheet for Healthcare Providers: IncredibleEmployment.be  This test is not yet approved or cleared by the [place] FDA and has been authorized for detection and/or diagnosis of SARS-CoV-2 by FDA under an Emergency Use Authorization (EUA). This EUA will remain in effect (meaning  this test can be used) for the duration of the COVID-19 declaration under Section 564(b)(1) of the Act, 21 U.S.C. section 360bbb-3(b)(1), unless the authorization is terminated or revoked.  Performed at [facility], [address] [place], [place] [address]   MRSA PCR Screening     Status: None   Collection Time: 06/10/20 12:24 AM   Specimen: Nasopharyngeal  Result Value Ref Range Status   MRSA by PCR NEGATIVE NEGATIVE Final    Comment:        The GeneXpert MRSA Assay (FDA approved for NASAL specimens only), is one component of a comprehensive MRSA colonization surveillance program. It is not intended to diagnose MRSA infection nor to guide or monitor treatment for MRSA infections. Performed at [facility], [address] [place], [place] [address]   Urine Culture     Status: None   Collection Time: 06/10/20  6:06 AM   Specimen: Urine, Random  Result Value Ref Range Status   Specimen Description   Final    URINE, RANDOM Performed at [facility], [address] [place], [place] [address]    Special Requests   Final    NONE Performed at [facility], [address] [place], [place] [address]    Culture   Final    NO GROWTH Performed at [facility], [place] [address], [place], [place] [address]    Report Status 06/12/2020 FINAL  Final  CULTURE, BLOOD (ROUTINE X 2) w Reflex to ID Panel     Status: None   Collection Time: 06/11/20 10:54 AM   Specimen: BLOOD RIGHT HAND  Result Value Ref Range Status   Specimen Description BLOOD RIGHT HAND  Final   Special Requests   Final    BOTTLES DRAWN AEROBIC AND ANAEROBIC Blood Culture results may not be optimal due to an excessive volume of blood received in culture bottles   Culture   Final    NO GROWTH 5 DAYS Performed at [facility], [address] [place], [place] [address]    Report Status 06/16/2020 FINAL  Final  CULTURE, BLOOD (ROUTINE X 2) w Reflex to ID Panel      Status: None   Collection Time: 06/11/20 11:04 AM   Specimen: BLOOD RIGHT ARM  Result Value Ref Range Status   Specimen Description BLOOD RIGHT ARM  Final   Special Requests   Final    BOTTLES DRAWN AEROBIC AND ANAEROBIC Blood Culture results may not be optimal due to an excessive volume of blood received in culture bottles   Culture   Final    NO GROWTH 5 DAYS Performed at [facility], [address], [place], [place] [address]    Report Status 06/16/2020 FINAL  Final    Coagulation Studies: No results for input(s): LABPROT, INR in the last 72 hours.  Urinalysis: No results for input(s): COLORURINE, LABSPEC, PHURINE, GLUCOSEU, HGBUR, BILIRUBINUR, KETONESUR, PROTEINUR, UROBILINOGEN, NITRITE, LEUKOCYTESUR in the last 72 hours.  Invalid input(s): APPERANCEUR    Imaging: PERIPHERAL VASCULAR CATHETERIZATION  Result Date: 06/17/2020 See op note    Medications:   . [MAR Hold] sodium chloride  Stopped (06/16/20 0352)  . sodium chloride 10 mL/hr at 06/17/20 1149   . [MAR Hold] Chlorhexidine Gluconate Cloth  6 each Topical Q0600  . fentaNYL      . [MAR Hold] ferrous sulfate  325 mg Oral QODAY  . [MAR Hold] heparin  5,000 Units Subcutaneous Q8H  . [MAR Hold] metoCLOPramide (REGLAN) injection  5 mg Intravenous Q8H  . midazolam      . [MAR Hold] mycophenolate  500 mg Oral BID  . [MAR Hold] pantoprazole (PROTONIX) IV  40 mg Intravenous QHS  . [MAR Hold] sevelamer carbonate  800 mg Oral TID WC  . [MAR Hold] sodium bicarbonate  650 mg Oral TID  . [MAR Hold] sodium chloride  1 g Oral BID WC   [MAR Hold] sodium chloride, [MAR Hold] acetaminophen, diphenhydrAMINE, [MAR Hold] docusate sodium, famotidine, [MAR Hold]  HYDROmorphone (DILAUDID) injection, [MAR Hold] LORazepam, methylPREDNISolone (SOLU-MEDROL) injection, midazolam, [MAR Hold] ondansetron (ZOFRAN) IV, [MAR Hold] ondansetron (ZOFRAN) IV, [MAR Hold] polyethylene glycol  Assessment/ Plan:  55 y.o. 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 EM with membranous pattern and extraglomerular immune deposits as well as some inflammation and fibrosis in the interstitium by EM. Favoring autoimmune process."  Was on Cell cept, plaquenil and prednisone as outpatient  -Continues to have quite low renal function with a EGFR of 9.   - Vascular placed Rt IJ Permcath today - Will receive initial dialysis treatment today, no UF - Will dialyze tomorrow also.  - Spoke with family using interpretor and answered all questions and concerns - Patient and family agree to proceed with above plan of care  #Hypertension  BP Readings from Last 1 Encounters:  06/17/20 119/78  Acceptable for this patient   #Hyperphosphatemia likely due to secondary hyperparathyroidism Received IV calcium 2g for hypocalcemia yesterday Calcium mproved to 6.8 Renvela with meals   LOS: [place] 4/13/20222:16 [place], [place]

## 2020-06-17 NOTE — Progress Notes (Signed)
This is a spanish speaking patient new to dialysis having her first Hd treatment today. Pre Hd, this RN utilized the translation APP to confirm the name and dob, assess for pain or other complaints and explain the HD procedure to the patient. The patient denied complaints and verbalized understanding via the app. Consent was also obtained after explaining via the english to [place] APP.

## 2020-06-17 NOTE — Progress Notes (Signed)
PT Cancellation Note  Patient Details Name: Carol Hicks MRN: [id number] DOB: 05-27-1965   Cancelled Treatment:    Reason Eval/Treat Not Completed: Other (comment). Pt currently off unit for perm cath placement this date. WIll re-attempt next date.   Bilal Manzer 06/17/2020, 12:58 PM Greggory Stallion, PT, DPT [phone]

## 2020-06-18 DIAGNOSIS — N186 End stage renal disease: Secondary | ICD-10-CM

## 2020-06-18 LAB — BASIC METABOLIC PANEL
Anion gap: 14 (ref 5–15)
BUN: 87 mg/dL — ABNORMAL HIGH (ref 6–20)
CO2: 20 mmol/L — ABNORMAL LOW (ref 22–32)
Calcium: 7.2 mg/dL — ABNORMAL LOW (ref 8.9–10.3)
Chloride: 98 mmol/L (ref 98–111)
Creatinine, Ser: 4.23 mg/dL — ABNORMAL HIGH (ref 0.44–1.00)
GFR, Estimated: 12 mL/min — ABNORMAL LOW (ref >60)
Glucose, Bld: 79 mg/dL (ref 70–99)
Potassium: 3.8 mmol/L (ref 3.5–5.1)
Sodium: 132 mmol/L — ABNORMAL LOW (ref 135–145)

## 2020-06-18 LAB — ECHOCARDIOGRAM COMPLETE
Area-P 1/2: 3.31 cm2
Height: 62 in
S' Lateral: 2.56 cm
Weight: 2874.8 oz

## 2020-06-18 LAB — CBC
HCT: 22.6 % — ABNORMAL LOW (ref 36.0–46.0)
Hemoglobin: 7.8 g/dL — ABNORMAL LOW (ref 12.0–15.0)
MCH: 28.1 pg (ref 26.0–34.0)
MCHC: 34.5 g/dL (ref 30.0–36.0)
MCV: 81.3 fL (ref 80.0–100.0)
Platelets: 255 10*3/uL (ref 150–400)
RBC: 2.78 MIL/uL — ABNORMAL LOW (ref 3.87–5.11)
RDW: 12.7 % (ref 11.5–15.5)
WBC: 4.7 10*3/uL (ref 4.0–10.5)
nRBC: 0 % (ref 0.0–0.2)

## 2020-06-18 LAB — SODIUM
Sodium: 133 mmol/L — ABNORMAL LOW (ref 135–145)
Sodium: 134 mmol/L — ABNORMAL LOW (ref 135–145)
Sodium: 130 mmol/L — ABNORMAL LOW (ref 135–145)
Sodium: 132 mmol/L — ABNORMAL LOW (ref 135–145)
Sodium: 132 mmol/L — ABNORMAL LOW (ref 135–145)

## 2020-06-18 LAB — HEPATITIS B SURFACE ANTIGEN: Hepatitis B Surface Ag: NONREACTIVE

## 2020-06-18 LAB — HEPATITIS B CORE ANTIBODY, TOTAL: Hep B Core Total Ab: NONREACTIVE

## 2020-06-18 NOTE — Progress Notes (Signed)
   06/17/20 2305 06/17/20 2310 06/17/20 2315  Vitals  BP  --   --  (!) 142/73  MAP (mmHg)  --   --  93  Pulse Rate  --   --  76  ECG Heart Rate  --   --  76  Resp  --   --  16  During Hemodialysis Assessment  Blood Flow Rate (mL/min) 200 mL/min 200 mL/min 200 mL/min  Arterial Pressure (mmHg) -120 mmHg -120 mmHg  --   Venous Pressure (mmHg) 160 mmHg 160 mmHg  --   Transmembrane Pressure (mmHg) 30 mmHg 30 mmHg  --   Ultrafiltration Rate (mL/min) 550 mL/min 550 mL/min  --   Dialysate Flow Rate (mL/min) 300 ml/min 300 ml/min  --   Conductivity: Machine  13.8 13.8  --   HD Safety Checks Performed Yes Yes  --   Dialysis Fluid Bolus Normal Saline Normal Saline  --   Bolus Amount (mL) 0 mL 0 mL  --   Intra-Hemodialysis Comments Progressing as prescribed Progressing as prescribed Tx completed

## 2020-06-18 NOTE — Progress Notes (Signed)
PT Cancellation Note  Patient Details Name: Carol Hicks MRN: [id number] DOB: [birth year]   Cancelled Treatment:    Reason Eval/Treat Not Completed: Other (comment). Pt currently out for HD at this time. Will re-attempt treatment another time.   Rosio Weiss 06/18/2020, 8:52 AM Greggory Stallion, PT, DPT [phone]

## 2020-06-18 NOTE — Progress Notes (Signed)
Following for outpatient hemodialysis placement, met and educated patient and husband today. Patient is following [facility] and would like to got to [place]. Referral sent. Waiting on clinic acceptance and insurance verification. Please contact me with any dialysis placement concerns.   Elvera Bicker Dialysis Coordinator [phone]

## 2020-06-18 NOTE — Progress Notes (Signed)
[facility], [place] 06/18/20  Subjective:   Hospital day # 9  Patient seen during dialysis   HEMODIALYSIS FLOWSHEET:  Blood Flow Rate (mL/min): 250 mL/min Arterial Pressure (mmHg): -150 mmHg Venous Pressure (mmHg): 50 mmHg Transmembrane Pressure (mmHg): 50 mmHg Ultrafiltration Rate (mL/min): 200 mL/min Dialysate Flow Rate (mL/min): 500 ml/min Conductivity: Machine : 13.8 Conductivity: Machine : 13.8 Dialysis Fluid Bolus: Normal Saline Bolus Amount (mL): 0 mL  Tolerating well Denies nausea, vomiting and pain Denies shortness of breath    04/13 0701 - 04/14 0700 In: 240 [P.O.:240] Out: 0  Lab Results  Component Value Date   CREATININE 4.23 (H) 06/18/2020   CREATININE 5.49 (H) 06/17/2020   CREATININE 5.46 (H) 06/16/2020     Objective:  Vital signs in last 24 hours:  Temp:  [97.5 F (36.4 C)-98.7 F (37.1 C)] 98.7 F (37.1 C) (04/14 0746) Pulse Rate:  [76-92] 78 (04/14 1030) Resp:  [14-23] 14 (04/14 1030) BP: (119-156)/(62-107) 152/83 (04/14 1030) SpO2:  [99 %-100 %] 99 % (04/14 0746) Weight:  [80.4 kg] 80.4 kg (04/14 0500)  Weight change: -1.1 kg Filed Weights   06/13/20 0500 06/17/20 0422 06/18/20 0500  Weight: 75.9 kg 81.5 kg 80.4 kg    Intake/Output:    Intake/Output Summary (Last 24 hours) at 06/18/2020 1206 Last data filed at 06/17/2020 2315 Gross per 24 hour  Intake 240 ml  Output 0 ml  Net 240 ml    Physical Exam: General:  No acute distress, laying in the bed  HEENT  moist oral mucous membrane  Pulm/lungs  normal breathing effort, clear to auscultation  CVS/Heart  regular rhythm, no rub or gallop  Abdomen:   Soft, nontender  Extremities:  2+ peripheral edema  Neurologic:  Awake, alert, conversant  Skin: No acute rashes  Access Rt IJ permcath placed 06/17/20     Basic Metabolic Panel:  Recent Labs  Lab 06/12/20 0313 06/12/20 0634 06/13/20 0442 06/13/20 0737 06/14/20 0716 06/14/20 1105 06/15/20 0309  06/15/20 0659 06/16/20 0250 06/16/20 0720 06/17/20 0249 06/17/20 0717 06/17/20 1953 06/17/20 2118 06/18/20 0415 06/18/20 0648  NA 114*   < > 120*   < > 124*   < > 126*   < > 126*   < > 127* 129* 129* 129* 132* 133*  K 3.9  --  3.9  --  5.2*  --  4.6  --  3.9  --  3.9  --   --   --  3.8  --   CL 80*  --  89*  --  96*  --  96*  --  96*  --  96*  --   --   --  98  --   CO2 17*  --  16*  --  13*  --  15*  --  16*  --  16*  --   --   --  20*  --   GLUCOSE 96  --  89  --  86  --  93  --  99  --  90  --   --   --  79  --   BUN 122*  --  117*  --  127*  --  123*  --  122*  --  123*  --   --   --  87*  --   CREATININE 5.94*  --  6.02*  --  6.05*  --  5.64*  --  5.46*  --  5.49*  --   --   --  4.23*  --   CALCIUM 6.3*  --  6.0*  --  6.1*  --  6.0*  --  6.4*  --  6.8*  --   --   --  7.2*  --   MG 1.9  --  1.9  --  1.9  --  1.8  --   --   --   --   --   --   --   --   --   PHOS 9.9*  --  10.0*  --  10.2*  --  9.0*  --   --   --   --   --   --   --   --   --    < > = values in this interval not displayed.     CBC: Recent Labs  Lab 06/14/20 0716 06/15/20 0309 06/16/20 0250 06/17/20 0249 06/18/20 0415  WBC 11.5* 8.8 7.3 5.4 4.7  HGB 8.5* 7.8* 7.5* 7.4* 7.8*  HCT 23.5* 21.2* 21.9* 21.9* 22.6*  MCV 78.9* 77.9* 81.7 82.6 81.3  PLT 319 290 292 267 255      Lab Results  Component Value Date   HEPBSAG NON REACTIVE 06/17/2020      Microbiology:  Recent Results (from the past 240 hour(s))  Resp Panel by RT-PCR (Flu A&B, Covid) Nasopharyngeal Swab     Status: None   Collection Time: 06/09/20  7:37 PM   Specimen: Nasopharyngeal Swab; Nasopharyngeal(NP) swabs in vial transport medium  Result Value Ref Range Status   SARS Coronavirus 2 by RT PCR NEGATIVE NEGATIVE Final    Comment: (NOTE) SARS-CoV-2 target nucleic acids are NOT DETECTED.  The SARS-CoV-2 RNA is generally detectable in upper respiratory specimens during the acute phase of infection. The lowest concentration of  SARS-CoV-2 viral copies this assay can detect is 138 copies/mL. A negative result does not preclude SARS-Cov-2 infection and should not be used as the sole basis for treatment or other patient management decisions. A negative result may occur with  improper specimen collection/handling, submission of specimen other than nasopharyngeal swab, presence of viral mutation(s) within the areas targeted by this assay, and inadequate number of viral copies(<138 copies/mL). A negative result must be combined with clinical observations, patient history, and epidemiological information. The expected result is Negative.  Fact Sheet for Patients:  EntrepreneurPulse.com.au  Fact Sheet for Healthcare Providers:  IncredibleEmployment.be  This test is no t yet approved or cleared by the [place] FDA and  has been authorized for detection and/or diagnosis of SARS-CoV-2 by FDA under an Emergency Use Authorization (EUA). This EUA will remain  in effect (meaning this test can be used) for the duration of the COVID-19 declaration under Section 564(b)(1) of the Act, 21 U.S.C.section 360bbb-3(b)(1), unless the authorization is terminated  or revoked sooner.       Influenza A by PCR NEGATIVE NEGATIVE Final   Influenza B by PCR NEGATIVE NEGATIVE Final    Comment: (NOTE) The Xpert Xpress SARS-CoV-2/FLU/RSV plus assay is intended as an aid in the diagnosis of influenza from Nasopharyngeal swab specimens and should not be used as a sole basis for treatment. Nasal washings and aspirates are unacceptable for Xpert Xpress SARS-CoV-2/FLU/RSV testing.  Fact Sheet for Patients: EntrepreneurPulse.com.au  Fact Sheet for Healthcare Providers: IncredibleEmployment.be  This test is not yet approved or cleared by the [place] FDA and has been authorized for detection and/or diagnosis of SARS-CoV-2 by FDA under an Emergency Use  Authorization (EUA). This EUA will remain  in effect (meaning this test can be used) for the duration of the COVID-19 declaration under Section 564(b)(1) of the Act, 21 U.S.C. section 360bbb-3(b)(1), unless the authorization is terminated or revoked.  Performed at [facility], [address] [place], [place] [address]   MRSA PCR Screening     Status: None   Collection Time: 06/10/20 12:24 AM   Specimen: Nasopharyngeal  Result Value Ref Range Status   MRSA by PCR NEGATIVE NEGATIVE Final    Comment:        The GeneXpert MRSA Assay (FDA approved for NASAL specimens only), is one component of a comprehensive MRSA colonization surveillance program. It is not intended to diagnose MRSA infection nor to guide or monitor treatment for MRSA infections. Performed at [facility], [address], [place], [place] [address]   Urine Culture     Status: None   Collection Time: 06/10/20  6:06 AM   Specimen: Urine, Random  Result Value Ref Range Status   Specimen Description   Final    URINE, RANDOM Performed at [facility], [address] [place], [place] [address]    Special Requests   Final    NONE Performed at [facility], [address] [place], [place] [address]    Culture   Final    NO GROWTH Performed at [facility], [place] [address] [place], [place] [address]    Report Status 06/12/2020 FINAL  Final  CULTURE, BLOOD (ROUTINE X 2) w Reflex to ID Panel     Status: None   Collection Time: 06/11/20 10:54 AM   Specimen: BLOOD RIGHT HAND  Result Value Ref Range Status   Specimen Description BLOOD RIGHT HAND  Final   Special Requests   Final    BOTTLES DRAWN AEROBIC AND ANAEROBIC Blood Culture results may not be optimal due to an excessive volume of blood received in culture bottles   Culture   Final    NO GROWTH 5 DAYS Performed at [facility], [address] [place], [place] [address]    Report Status 06/16/2020  FINAL  Final  CULTURE, BLOOD (ROUTINE X 2) w Reflex to ID Panel     Status: None   Collection Time: 06/11/20 11:04 AM   Specimen: BLOOD RIGHT ARM  Result Value Ref Range Status   Specimen Description BLOOD RIGHT ARM  Final   Special Requests   Final    BOTTLES DRAWN AEROBIC AND ANAEROBIC Blood Culture results may not be optimal due to an excessive volume of blood received in culture bottles   Culture   Final    NO GROWTH 5 DAYS Performed at [facility], [address] [place], [place] [address]    Report Status 06/16/2020 FINAL  Final    Coagulation Studies: No results for input(s): LABPROT, INR in the last 72 hours.  Urinalysis: No results for input(s): COLORURINE, LABSPEC, PHURINE, GLUCOSEU, HGBUR, BILIRUBINUR, KETONESUR, PROTEINUR, UROBILINOGEN, NITRITE, LEUKOCYTESUR in the last 72 hours.  Invalid input(s): APPERANCEUR    Imaging: PERIPHERAL VASCULAR CATHETERIZATION  Result Date: 06/17/2020 See op note  ECHOCARDIOGRAM COMPLETE  Result Date: 06/18/2020    ECHOCARDIOGRAM REPORT   Patient Name:   Carol Hicks Date of Exam: 06/17/2020 Medical Rec #:  [id number]       Height:       62.0 in Accession #:    [id number]      Weight:       179.7 lb Date of  Birth:  1965/05/18        BSA:          1.827 m Patient Age:    55 years        BP:           136/83 mmHg Patient Gender: F               HR:           80 bpm. Exam Location:  [facility] Procedure: 2D Echo, Cardiac Doppler and Color Doppler Indications:     R01.1 Murmur  History:         Patient has no prior history of Echocardiogram examinations.                  Lupus. Kidney disease.  Sonographer:     Wilford Sports Rodgers-Jones Referring Phys:  [id number] Nolberto Hanlon Diagnosing Phys: Ida Rogue MD IMPRESSIONS  1. Left ventricular ejection fraction, by estimation, is 60 to 65%. The left ventricle has normal function. The left ventricle has no regional wall motion abnormalities. There is mild to moderate left ventricular hypertrophy.  Left ventricular diastolic parameters are consistent with Grade II diastolic dysfunction (pseudonormalization).  2. Right ventricular systolic function is normal. The right ventricular size is normal.  3. The mitral valve is normal in structure. Mild mitral valve regurgitation.  4. Tricuspid valve regurgitation is mild to moderate. FINDINGS  Left Ventricle: Left ventricular ejection fraction, by estimation, is 60 to 65%. The left ventricle has normal function. The left ventricle has no regional wall motion abnormalities. The left ventricular internal cavity size was normal in size. There is  mild to moderate left ventricular hypertrophy. Left ventricular diastolic parameters are consistent with Grade II diastolic dysfunction (pseudonormalization). Right Ventricle: The right ventricular size is normal. No increase in right ventricular wall thickness. Right ventricular systolic function is normal. Left Atrium: Left atrial size was normal in size. Right Atrium: Right atrial size was normal in size. Pericardium: There is no evidence of pericardial effusion. Mitral Valve: The mitral valve is normal in structure. Mild mitral valve regurgitation. No evidence of mitral valve stenosis. Tricuspid Valve: The tricuspid valve is normal in structure. Tricuspid valve regurgitation is mild to moderate. No evidence of tricuspid stenosis. Aortic Valve: The aortic valve is normal in structure. Aortic valve regurgitation is not visualized. No aortic stenosis is present. Pulmonic Valve: The pulmonic valve was normal in structure. Pulmonic valve regurgitation is not visualized. No evidence of pulmonic stenosis. Aorta: The aortic root is normal in size and structure. Venous: The inferior vena cava is normal in size with greater than 50% respiratory variability, suggesting right atrial pressure of 3 mmHg. IAS/Shunts: No atrial level shunt detected by color flow Doppler.  LEFT VENTRICLE PLAX 2D LVIDd:         4.66 cm  Diastology LVIDs:          2.56 cm  LV e' medial:    6.74 cm/s LV PW:         1.18 cm  LV E/e' medial:  15.1 LV IVS:        0.98 cm  LV e' lateral:   10.80 cm/s LVOT diam:     1.90 cm  LV E/e' lateral: 9.4 LV SV:         73 LV SV Index:   40 LVOT Area:     2.84 cm  RIGHT VENTRICLE             IVC RV  Basal diam:  3.85 cm     IVC diam: 1.66 cm RV S prime:     18.50 cm/s TAPSE (M-mode): 2.3 cm LEFT ATRIUM             Index       RIGHT ATRIUM           Index LA diam:        3.10 cm 1.70 cm/m  RA Area:     13.10 cm LA Vol (A2C):   33.3 ml 18.23 ml/m RA Volume:   32.50 ml  17.79 ml/m LA Vol (A4C):   35.3 ml 19.33 ml/m LA Biplane Vol: 37.2 ml 20.37 ml/m  AORTIC VALVE LVOT Vmax:   127.00 cm/s LVOT Vmean:  90.900 cm/s LVOT VTI:    0.258 m  AORTA Ao Root diam: 3.00 cm Ao Asc diam:  3.60 cm MITRAL VALVE                TRICUSPID VALVE MV Area (PHT): 3.31 cm     TR Peak grad:   25.8 mmHg MV Decel Time: 229 msec     TR Vmax:        254.00 cm/s MV E velocity: 102.00 cm/s MV A velocity: 95.40 cm/s   SHUNTS MV E/A ratio:  1.07         Systemic VTI:  0.26 m                             Systemic Diam: 1.90 cm Ida Rogue MD Electronically signed by Ida Rogue MD Signature Date/Time: 06/18/2020/10:18:41 AM    Final      Medications:   . sodium chloride Stopped (06/16/20 0352)   . Chlorhexidine Gluconate Cloth  6 each Topical Q0600  . ferrous sulfate  325 mg Oral QODAY  . heparin  5,000 Units Subcutaneous Q8H  . metoCLOPramide (REGLAN) injection  5 mg Intravenous Q8H  . mycophenolate  500 mg Oral BID  . pantoprazole (PROTONIX) IV  40 mg Intravenous QHS  . sevelamer carbonate  800 mg Oral TID WC  . sodium bicarbonate  650 mg Oral TID  . sodium chloride  1 g Oral BID WC   sodium chloride, acetaminophen, docusate sodium, HYDROmorphone (DILAUDID) injection, LORazepam, ondansetron (ZOFRAN) IV, ondansetron (ZOFRAN) IV, polyethylene glycol  Assessment/ Plan:  55 y.o. 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 EM with membranous pattern and extraglomerular immune deposits as well as some inflammation and fibrosis in the interstitium by EM. Favoring autoimmune process." Based on current trend and lack of sustainable renal recover, patient is now believed to be In end stage renal disease. Was on Cell cept, plaquenil and prednisone as outpatient  -Continues to have quite low renal function with a EGFR of 9.   - Vascular placed Rt IJ Permcath today - Received initial dialysis treatment yesterday - Tolerated well - Will receive second treatment today.  - Will provide dialysis tomorrow in chair to prepare for discharge -  Dialysis liaison notified of discharge needs and will meet with patient today  #Hypertension  BP Readings from Last 1 Encounters:  06/18/20 (!) 152/83  Continues to be elevated Home medications currently held   #Hyperphosphatemia likely due to secondary hyperparathyroidism Calcium improved to 7.2 Renvela with meals   LOS: [place] 4/14/202212:06 [place], [place]

## 2020-06-18 NOTE — Progress Notes (Signed)
   06/17/20 2113 06/17/20 2115 06/17/20 2130  Vitals  BP 135/74  --  (!) 147/83  MAP (mmHg) 89  --  97  Pulse Rate 88 82 86  ECG Heart Rate 86 82 85  Resp '18 17 15  '$ During Hemodialysis Assessment  Blood Flow Rate (mL/min) 200 mL/min 200 mL/min 200 mL/min  Arterial Pressure (mmHg) -80 mmHg -80 mmHg -80 mmHg  Venous Pressure (mmHg) 40 mmHg 40 mmHg 40 mmHg  Transmembrane Pressure (mmHg) 50 mmHg 50 mmHg 50 mmHg  Ultrafiltration Rate (mL/min) 250 mL/min 250 mL/min 250 mL/min  Dialysate Flow Rate (mL/min) 300 ml/min 300 ml/min 300 ml/min  Conductivity: Machine  13.7 13.7 13.7  HD Safety Checks Performed Yes Yes Yes  Dialysis Fluid Bolus Normal Saline Normal Saline Normal Saline  Bolus Amount (mL) 200 mL 0 mL 0 mL  Intra-Hemodialysis Comments Tx initiated Progressing as prescribed Progressing as prescribed    06/17/20 2145 06/17/20 2200 06/17/20 2215  Vitals  BP 128/77 (!) 144/78 (!) 148/77  MAP (mmHg) 92 97 95  Pulse Rate 80 80 82  ECG Heart Rate 82 82 80  Resp '15 16 16  '$ During Hemodialysis Assessment  Blood Flow Rate (mL/min) 200 mL/min 200 mL/min 200 mL/min  Arterial Pressure (mmHg) -190 mmHg -200 mmHg -180 mmHg  Venous Pressure (mmHg) 140 mmHg 160 mmHg 190 mmHg  Transmembrane Pressure (mmHg) 50 mmHg 40 mmHg 30 mmHg  Ultrafiltration Rate (mL/min) 250 mL/min 250 mL/min 250 mL/min  Dialysate Flow Rate (mL/min) 300 ml/min 300 ml/min 300 ml/min  Conductivity: Machine  13.6 13.6 13.6  HD Safety Checks Performed Yes Yes Yes  Dialysis Fluid Bolus Normal Saline Normal Saline Normal Saline  Bolus Amount (mL) 0 mL 100 mL (given to prevent clotting of dialyzer, added to prime and rinseback) 50 mL (frequent arterial pressure alarms, flushed again and lines reversed. Flush added to prime and rinseback)  Intra-Hemodialysis Comments Progressing as prescribed Progressing as prescribed Progressing as prescribed    06/17/20 2230 06/17/20 2245 06/17/20 2300  Vitals  BP 139/77 136/73 (!) 150/81   MAP (mmHg) 93 92 100  Pulse Rate 79 77 78  ECG Heart Rate 78 76 77  Resp '15 17 15  '$ During Hemodialysis Assessment  Blood Flow Rate (mL/min) 200 mL/min 200 mL/min 200 mL/min  Arterial Pressure (mmHg) -130 mmHg -90 mmHg -120 mmHg  Venous Pressure (mmHg) 190 mmHg 300 mmHg 160 mmHg  Transmembrane Pressure (mmHg) 30 mmHg 40 mmHg 30 mmHg  Ultrafiltration Rate (mL/min) 420 mL/min 430 mL/min 550 mL/min  Dialysate Flow Rate (mL/min) 300 ml/min 300 ml/min 300 ml/min  Conductivity: Machine  13.6 13.7 13.8  HD Safety Checks Performed Yes Yes Yes  Dialysis Fluid Bolus Normal Saline Normal Saline Normal Saline  Bolus Amount (mL) 0 mL 100 mL (system at risk of clotting, flushed again and added to Prime and rinse back amount) 0 mL  Intra-Hemodialysis Comments Progressing as prescribed Progressing as prescribed Progressing as prescribed

## 2020-06-18 NOTE — Progress Notes (Signed)
PROGRESS NOTE    Carol Hicks  [id number] DOB: 10-01-1965 DOA: 06/09/2020 PCP: Pcp, No    Brief Narrative:  This is a 55 yo female who presented to [facility] ER on 04/5 from home with seizure activity.  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  There is concern pt may be close to ESRD and dialysis and/or kidney transplant.  During a previous hospitalization she was started on bumex 2 mg bid and sodium bicarb.  She was recently diagnosed with a viral URI with recommendations of supportive care and close outpatient follow-up on 03/25.  Pts daughter reported the pt has not been "acting the same" for about 2 weeks.  She also reported the pt went from a lying position to a seated position she had a seizure today that lasted for about 2-3 minutes. During the seizure the pts eyes rolled back and both her arms were shaking.  Following the seizure the pt appeared very sleepy for about 5-10 minutes.  EMS reported the pts vital signs were stable when they arrived.  ED Course Upon arrival to the ER pts daughter reported the pt appeared at her baseline, but the pt had no recollection of what happened prior to arrival in the ER.  Pt reported she has had emesis when taking her sodium bicarb tablet and loose stools over the past 24-36 hrs.  She also reported insomnia, on average only able to sleep 1-2 hrs per night over the past 2 weeks.  Lab results revealed Na+ 105, chloride 69, CO2 18, glucose 150, BUN 111, creatinine 6.24, calcium 6.9, anion gap 18, albumin 2.3, troponin 20, wbc 12.7, and hgb 8.8, platelets 440.  CT Head negative for acute intracranial findings or mass lesions.  She received 500 ml LR bolus.  ER physician contacted on call nephrologist Dr. Candiss Norse and he recommended the following: 3% saline 50 ml bolus x1 to raise Na+ above 110 with slow correction thereafter (goal of  ~8 Meq correction in 24hrs).  Received 100 ml bolus of hypertonic saline per ER physician.   Pt admitted to ICU with hyponatremia Na 105 received 3% saline 50 ml bolus  06/10/20: Remained on 3% Hypertonic saline, latest Na 112 this afternoon (Na 105 upon presentation @ 18:00 yesterday); per discussion with Nephrology will hold 3% Hypertonic saline and follow correction with PO intake 06/11/20: Serum Na remains low, placed on NS overnight. Placing back on 3% Saline by Nephrology, increasing WBC 22.9, CXR with left basilar opacity concerning for atelectasis vs. PNA, placed on empiric Azithromycin + Rocephin, cultures obtained 06/11/20: Serum Na remains below 120 (115), Hypertonic saline increased by Nephrology, Strep pneumo urinary antigen negative, all other cultures still pending.  06/13/2020: Patient noted to have significant anemia, consent obtained for transfusion.  Sodium 122, continue 3% saline at reduced dose of 20 mL/h. 06/14/2020: Transition from IV 3% hypertonic saline to p.o. saline tablets, 06/15/2020: Remains on PO salt tablets, Na slowly improving, up to 126 on am labs; consider transfer to Med-Surg if Nephrology on board, will transfer service to [facility] tomorrow  4/12-PCCM pickup today 4/13-plan for HD cath placement by vascular today 4/14-in dialysis today tolerating.   Consultants:   Nephrology, PCCM, vascular  Procedures:   Antimicrobials:  06/11/20: Blood culture x2>>no growth to date 06/11/20: Sputum>> 06/11/20: Strep pneumo urinary antigen>> negative 06/11/20: Legionella urinary antigen>>negative  06/11/20: Mycoplasma pneumoniae >> 06/11/20: Pneumocystis PCR>> 06/11/20: Urine>>no growth  Azithromycin 4/7>> Ceftriaxone 4/7>>  Subjective: Has no complaints.  Denies chest pain, shortness of breath or abdominal pain  Objective: Vitals:   06/18/20 0018 06/18/20 0500 06/18/20 0603 06/18/20 0746  BP: 133/85  139/73 121/62  Pulse: 85  92 85  Resp: '18  18 20  '$ Temp: 97.9 F (36.6 C)  98.7  F (37.1 C) 98.7 F (37.1 C)  TempSrc: Oral   Oral  SpO2: 100%  100% 99%  Weight:  80.4 kg    Height:        Intake/Output Summary (Last 24 hours) at 06/18/2020 0807 Last data filed at 06/17/2020 2315 Gross per 24 hour  Intake 240 ml  Output 0 ml  Net 240 ml   Filed Weights   06/13/20 0500 06/17/20 0422 06/18/20 0500  Weight: 75.9 kg 81.5 kg 80.4 kg    Examination: Calm, comfortable CTA, no wheeze rales rhonchi's Regular S1-S2 no gallops Soft benign positive bowel sounds Extremities x4 with edema Alert oriented x3 grossly intact Mood and affect appropriate in current setting   Data Reviewed: I have personally reviewed following labs and imaging studies  CBC: Recent Labs  Lab 06/11/20 1054 06/12/20 0313 06/14/20 0716 06/15/20 0309 06/16/20 0250 06/17/20 0249 06/18/20 0415  WBC 19.1*   < > 11.5* 8.8 7.3 5.4 4.7  NEUTROABS 17.7*  --   --   --   --   --   --   HGB 7.8*   < > 8.5* 7.8* 7.5* 7.4* 7.8*  HCT 21.3*   < > 23.5* 21.2* 21.9* 21.9* 22.6*  MCV 75.5*   < > 78.9* 77.9* 81.7 82.6 81.3  PLT 365   < > 319 290 292 267 255   < > = values in this interval not displayed.   Basic Metabolic Panel: Recent Labs  Lab 06/12/20 0313 06/12/20 0634 06/13/20 0442 06/13/20 [id number] 06/14/20 0716 06/14/20 1105 06/15/20 0309 06/15/20 0659 06/16/20 0250 06/16/20 0720 06/17/20 0249 06/17/20 0717 06/17/20 1953 06/17/20 2118 06/18/20 0415 06/18/20 0648  NA 114*   < > 120*   < > 124*   < > 126*   < > 126*   < > 127* 129* 129* 129* 132* 133*  K 3.9  --  3.9  --  5.2*  --  4.6  --  3.9  --  3.9  --   --   --  3.8  --   CL 80*  --  89*  --  96*  --  96*  --  96*  --  96*  --   --   --  98  --   CO2 17*  --  16*  --  13*  --  15*  --  16*  --  16*  --   --   --  20*  --   GLUCOSE 96  --  89  --  86  --  93  --  99  --  90  --   --   --  79  --   BUN 122*  --  117*  --  127*  --  123*  --  122*  --  123*  --   --   --  87*  --   CREATININE 5.94*  --  6.02*  --  6.05*  --   5.64*  --  5.46*  --  5.49*  --   --   --  4.23*  --   CALCIUM 6.3*  --  6.0*  --  6.1*  --  6.0*  --  6.4*  --  6.8*  --   --   --  7.2*  --   MG 1.9  --  1.9  --  1.9  --  1.8  --   --   --   --   --   --   --   --   --   PHOS 9.9*  --  10.0*  --  10.2*  --  9.0*  --   --   --   --   --   --   --   --   --    < > = values in this interval not displayed.   GFR: Estimated Creatinine Clearance: 15.1 mL/min (A) (by C-G formula based on SCr of 4.23 mg/dL (H)). Liver Function Tests: Recent Labs  Lab 06/11/20 1054 06/12/20 0313 06/13/20 0442  AST 24  --   --   ALT 14  --   --   ALKPHOS 82  --   --   BILITOT 0.6  --   --   PROT 5.7*  --   --   ALBUMIN 2.1* 2.0* 1.8*   No results for input(s): LIPASE, AMYLASE in the last 168 hours. No results for input(s): AMMONIA in the last 168 hours. Coagulation Profile: No results for input(s): INR, PROTIME in the last 168 hours. Cardiac Enzymes: No results for input(s): CKTOTAL, CKMB, CKMBINDEX, TROPONINI in the last 168 hours. BNP (last 3 results) No results for input(s): PROBNP in the last 8760 hours. HbA1C: No results for input(s): HGBA1C in the last 72 hours. CBG: Recent Labs  Lab 06/11/20 2144 06/15/20 2101  GLUCAP 100* 120*   Lipid Profile: No results for input(s): CHOL, HDL, LDLCALC, TRIG, CHOLHDL, LDLDIRECT in the last 72 hours. Thyroid Function Tests: No results for input(s): TSH, T4TOTAL, FREET4, T3FREE, THYROIDAB in the last 72 hours. Anemia Panel: No results for input(s): VITAMINB12, FOLATE, FERRITIN, TIBC, IRON, RETICCTPCT in the last 72 hours. Sepsis Labs: Recent Labs  Lab 06/11/20 1054 06/12/20 0313 06/13/20 0442  PROCALCITON 4.00 3.70 3.56    Recent Results (from the past 240 hour(s))  Resp Panel by RT-PCR (Flu A&B, Covid) Nasopharyngeal Swab     Status: None   Collection Time: 06/09/20  7:37 PM   Specimen: Nasopharyngeal Swab; Nasopharyngeal(NP) swabs in vial transport medium  Result Value Ref Range Status    SARS Coronavirus 2 by RT PCR NEGATIVE NEGATIVE Final    Comment: (NOTE) SARS-CoV-2 target nucleic acids are NOT DETECTED.  The SARS-CoV-2 RNA is generally detectable in upper respiratory specimens during the acute phase of infection. The lowest concentration of SARS-CoV-2 viral copies this assay can detect is 138 copies/mL. A negative result does not preclude SARS-Cov-2 infection and should not be used as the sole basis for treatment or other patient management decisions. A negative result may occur with  improper specimen collection/handling, submission of specimen other than nasopharyngeal swab, presence of viral mutation(s) within the areas targeted by this assay, and inadequate number of viral copies(<138 copies/mL). A negative result must be combined with clinical observations, patient history, and epidemiological information. The expected result is Negative.  Fact Sheet for Patients:  EntrepreneurPulse.com.au  Fact Sheet for Healthcare Providers:  IncredibleEmployment.be  This test is no t yet approved or cleared by the [place] FDA and  has been authorized for detection and/or diagnosis of  SARS-CoV-2 by FDA under an Emergency Use Authorization (EUA). This EUA will remain  in effect (meaning this test can be used) for the duration of the COVID-19 declaration under Section 564(b)(1) of the Act, 21 U.S.C.section 360bbb-3(b)(1), unless the authorization is terminated  or revoked sooner.       Influenza A by PCR NEGATIVE NEGATIVE Final   Influenza B by PCR NEGATIVE NEGATIVE Final    Comment: (NOTE) The Xpert Xpress SARS-CoV-2/FLU/RSV plus assay is intended as an aid in the diagnosis of influenza from Nasopharyngeal swab specimens and should not be used as a sole basis for treatment. Nasal washings and aspirates are unacceptable for Xpert Xpress SARS-CoV-2/FLU/RSV testing.  Fact Sheet for  Patients: EntrepreneurPulse.com.au  Fact Sheet for Healthcare Providers: IncredibleEmployment.be  This test is not yet approved or cleared by the [place] FDA and has been authorized for detection and/or diagnosis of SARS-CoV-2 by FDA under an Emergency Use Authorization (EUA). This EUA will remain in effect (meaning this test can be used) for the duration of the COVID-19 declaration under Section 564(b)(1) of the Act, 21 U.S.C. section 360bbb-3(b)(1), unless the authorization is terminated or revoked.  Performed at [facility], [address] [place], [place] [address]   MRSA PCR Screening     Status: None   Collection Time: 06/10/20 12:24 AM   Specimen: Nasopharyngeal  Result Value Ref Range Status   MRSA by PCR NEGATIVE NEGATIVE Final    Comment:        The GeneXpert MRSA Assay (FDA approved for NASAL specimens only), is one component of a comprehensive MRSA colonization surveillance program. It is not intended to diagnose MRSA infection nor to guide or monitor treatment for MRSA infections. Performed at [facility], [address] [place], [place] [address]   Urine Culture     Status: None   Collection Time: 06/10/20  6:06 AM   Specimen: Urine, Random  Result Value Ref Range Status   Specimen Description   Final    URINE, RANDOM Performed at [facility], [address] [place], [place] [address]    Special Requests   Final    NONE Performed at [facility], [address] [place], [place] [address]    Culture   Final    NO GROWTH Performed at [facility], [place] [address], [place], [place] [address]    Report Status 06/12/2020 FINAL  Final  CULTURE, BLOOD (ROUTINE X 2) w Reflex to ID Panel     Status: None   Collection Time: 06/11/20 10:54 AM   Specimen: BLOOD RIGHT HAND  Result Value Ref Range Status   Specimen Description BLOOD RIGHT HAND  Final   Special Requests    Final    BOTTLES DRAWN AEROBIC AND ANAEROBIC Blood Culture results may not be optimal due to an excessive volume of blood received in culture bottles   Culture   Final    NO GROWTH 5 DAYS Performed at [facility], [address] [place],  [address]    Report Status 06/16/2020 FINAL  Final  CULTURE, BLOOD (ROUTINE X 2) w Reflex to ID Panel     Status: None   Collection Time: 06/11/20 11:04 AM   Specimen: BLOOD RIGHT ARM  Result Value Ref Range Status   Specimen Description BLOOD RIGHT ARM  Final   Special Requests   Final    BOTTLES DRAWN AEROBIC AND ANAEROBIC Blood Culture results may not be optimal due to  an excessive volume of blood received in culture bottles   Culture   Final    NO GROWTH 5 DAYS Performed at [facility], [address] [place], [place] [address]    Report Status 06/16/2020 FINAL  Final         Radiology Studies: PERIPHERAL VASCULAR CATHETERIZATION  Result Date: 06/17/2020 See op note       Scheduled Meds: . Chlorhexidine Gluconate Cloth  6 each Topical Q0600  . ferrous sulfate  325 mg Oral QODAY  . heparin  5,000 Units Subcutaneous Q8H  . metoCLOPramide (REGLAN) injection  5 mg Intravenous Q8H  . mycophenolate  500 mg Oral BID  . pantoprazole (PROTONIX) IV  40 mg Intravenous QHS  . sevelamer carbonate  800 mg Oral TID WC  . sodium bicarbonate  650 mg Oral TID  . sodium chloride  1 g Oral BID WC   Continuous Infusions: . sodium chloride Stopped (06/16/20 0352)    Assessment & Plan:   Active Problems:   Hyponatremia   Severe hyponatremia and hypochloremia likely diuretic induced Anion gap metabolic acidosis  Seizure due to hyponatremia Likely due to excessive diuresis Seizure precautions as pt presented with seizure.   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 EM with membranous pattern and extraglomerular immune deposits as well as some inflammation and fibrosis in the interstitium by EM. Favoring autoimmune process." Was on Cell cept, plaquenil and prednisone as outpatient  4/13- plan for HD permacath today by vascular  4/14-receiving second hemodialysis treatment today Plan is to have dialysis tomorrow in chair to prepare for discharge Dialysis liaison notified of discharge needs and will meet with patient today    Leukocytosis Concern for Left Basilar Pneumonia on CXR 06/11/20 Leukocytosis improved Afebrile Blood cultures negative -Monitor fever curve  -Trend WBC's and Procalcitonin -Procalcitonin on downward trend however still 3.56 -Follow cultures as above (strep pneumo and Legionella are negative, urine is negative, blood cultures still pending) -Treated with azithromycin and Rocephin completed 5-day course    Hyperglycemia  -BG levels stable  Continue R-ISS BG level stable Continue Riss   Murmur-echo with mild to moderate TR  DVT prophylaxis: Heparin subcu Code Status: Full Family Communication: None at bedside at hemodialysis room  Status is: Inpatient  Remains inpatient appropriate because:Inpatient level of care appropriate due to severity of illness   Dispo: The patient is from: Home               Anticipated d/c is to: Home              Patient currently is not medically stable to d/c.   Difficult to place patient No            LOS: 9  days   Time spent: 35 minutes with more than 50% on COC    Nolberto Hanlon, MD [facility] Pager [phone]  If 7PM-7AM, please contact night-coverage 06/18/2020, 8:07 AM

## 2020-06-18 NOTE — TOC Initial Note (Addendum)
Transition of Care (TOC) - Initial/Assessment Note    Patient Details  Name: Carol Hicks MRN: [id number] Date of Birth: 08/17/1965  Transition of Care (TOC) CM/SW Contact:    Stephanie T Bowen, RN Phone Number: 06/18/2020, 4:14 PM  Clinical Narrative:                 Patient admitted from home with hyponatremia Met with patient and 2 daughters at bedside Used electronic Spanish interpreter  Patient lives at home with husband and 2 daughters  [facility] - denies issues obtaining medicatinos  Prior to admission patient was independent, working and driving  Patient will require outpatient HD at discharge.  Amanda Morris HD coordinator working on placement.  Daughter has taken off work and will be transporting patient the 1st week to HD  PT has assessed patient and recommends home health PT.  Patient would benefit from home health RN, PT, OT.  Patient states that she does not have a preference of agency Per Jason with [facility] they are unable to accept patient   Per Cory with [facility] they can potentially accept patient with start of care Tuesday    Referral made to [facility] for [facility].  To be delivered to room Expected Discharge Plan: Home w Home Health Services Barriers to Discharge: Continued Medical Work up   Patient Goals and CMS Choice        Expected Discharge Plan and Services Expected Discharge Plan: Home w Home Health Services     Post Acute Care Choice: Durable Medical Equipment,Home Health                   DME Arranged: 3-N-1 DME Agency: [facility] Date DME Agency Contacted: 06/18/20   Representative spoke with at DME Agency: [facility] HH Arranged: RN,PT,OT          Prior Living Arrangements/Services   Lives with:: Adult Children,Spouse Patient language and need for interpreter reviewed:: Yes Do you feel safe going back to the place where you live?: Yes      Need for Family Participation in Patient Care: Yes (Comment) Care  giver support system in place?: Yes (comment) Current home services: DME Criminal Activity/Legal Involvement Pertinent to Current Situation/Hospitalization: No - Comment as needed  Activities of Daily Living Home Assistive Devices/Equipment: Shower chair with back,Walker (specify type) ADL Screening (condition at time of admission) Patient's cognitive ability adequate to safely complete daily activities?: Yes Is the patient deaf or have difficulty hearing?: No Does the patient have difficulty seeing, even when wearing glasses/contacts?: No Does the patient have difficulty concentrating, remembering, or making decisions?: Yes Patient able to express need for assistance with ADLs?: Yes Does the patient have difficulty dressing or bathing?: Yes Independently performs ADLs?: No Communication: Independent Dressing (OT): Independent,Needs assistance Grooming: Independent,Needs assistance Is this a change from baseline?: Pre-admission baseline Feeding: Independent Bathing: Needs assistance Is this a change from baseline?: Pre-admission baseline Toileting: Independent In/Out Bed: Independent with device (comment) Walks in Home: Independent with device (comment) Does the patient have difficulty walking or climbing stairs?: No Weakness of Legs: Both Weakness of Arms/Hands: None  Permission Sought/Granted                  Emotional Assessment Appearance:: Appears stated age     Orientation: : Oriented to Self,Oriented to Place,Oriented to  Time,Oriented to Situation Alcohol / Substance Use: Not Applicable Psych Involvement: No (comment)  Admission diagnosis:  Hyponatremia [E87.1] Seizure (HCC) [R56.9] Acute renal   failure, unspecified acute renal failure type [facility]) [N17.9] Patient Active Problem List   Diagnosis Date Noted  . Hyponatremia 06/09/2020   PCP:  Tracie Harrier, MD Pharmacy:  No Pharmacies Listed    Social Determinants of Health (SDOH) Interventions     Readmission Risk Interventions No flowsheet data found.

## 2020-06-19 DIAGNOSIS — J181 Lobar pneumonia, unspecified organism: Secondary | ICD-10-CM

## 2020-06-19 LAB — CBC
HCT: 21.7 % — ABNORMAL LOW (ref 36.0–46.0)
Hemoglobin: 7.2 g/dL — ABNORMAL LOW (ref 12.0–15.0)
MCH: 27.7 pg (ref 26.0–34.0)
MCHC: 33.2 g/dL (ref 30.0–36.0)
MCV: 83.5 fL (ref 80.0–100.0)
Platelets: 200 10*3/uL (ref 150–400)
RBC: 2.6 MIL/uL — ABNORMAL LOW (ref 3.87–5.11)
RDW: 12.8 % (ref 11.5–15.5)
WBC: 4.5 10*3/uL (ref 4.0–10.5)
nRBC: 0 % (ref 0.0–0.2)

## 2020-06-19 LAB — SODIUM
Sodium: 132 mmol/L — ABNORMAL LOW (ref 135–145)
Sodium: 135 mmol/L (ref 135–145)
Sodium: 135 mmol/L (ref 135–145)

## 2020-06-19 LAB — HEPATITIS B DNA, ULTRAQUANTITATIVE, PCR
HBV DNA SERPL PCR-ACNC: NOT DETECTED IU/mL
HBV DNA SERPL PCR-LOG IU: UNDETERMINED log10 IU/mL

## 2020-06-19 LAB — HEPATITIS B SURFACE ANTIBODY, QUANTITATIVE: Hep B S AB Quant (Post): <3.1 m[IU]/mL — ABNORMAL LOW (ref >9.9)

## 2020-06-19 NOTE — Progress Notes (Signed)
[facility], [place] 06/19/20  Subjective:   Hospital day # 10  Patient seen during dialysis   HEMODIALYSIS FLOWSHEET:  Blood Flow Rate (mL/min): 300 mL/min Arterial Pressure (mmHg): -80 mmHg Venous Pressure (mmHg): 50 mmHg Transmembrane Pressure (mmHg): 50 mmHg Ultrafiltration Rate (mL/min): 500 mL/min Dialysate Flow Rate (mL/min): 500 ml/min Conductivity: Machine : 13.7 Conductivity: Machine : 13.7 Dialysis Fluid Bolus: Normal Saline Bolus Amount (mL): 0 mL Dialysate Change: Other (comment)  Patient seen resting in bed Denies shortness of breath and nausea Denies pain and discomfort    04/14 0701 - 04/15 0700 In: 360 [P.O.:360] Out: 1  Lab Results  Component Value Date   CREATININE 4.23 (H) 06/18/2020   CREATININE 5.49 (H) 06/17/2020   CREATININE 5.46 (H) 06/16/2020     Objective:  Vital signs in last 24 hours:  Temp:  [98.5 F (36.9 C)-99.5 F (37.5 C)] 98.5 F (36.9 C) (04/15 0405) Pulse Rate:  [69-89] 69 (04/15 1230) Resp:  [14-20] 15 (04/15 1230) BP: (127-149)/(67-82) 149/77 (04/15 1230) SpO2:  [97 %-100 %] 97 % (04/15 0405) Weight:  [79.7 kg] 79.7 kg (04/15 0617)  Weight change: -0.7 kg Filed Weights   06/17/20 0422 06/18/20 0500 06/19/20 0617  Weight: 81.5 kg 80.4 kg 79.7 kg    Intake/Output:    Intake/Output Summary (Last 24 hours) at 06/19/2020 1328 Last data filed at 06/19/2020 1230 Gross per 24 hour  Intake 360 ml  Output 1000 ml  Net -640 ml    Physical Exam: General:  No acute distress, laying in the bed  HEENT  moist oral mucous membrane  Pulm/lungs  normal breathing effort, crackles  CVS/Heart  regular rhythm, no rub or gallop  Abdomen:   Soft, nontender  Extremities:  2+ peripheral edema  Neurologic:  Awake, alert, conversant  Skin: No acute rashes  Access Rt IJ permcath placed 06/17/20     Basic Metabolic Panel:  Recent Labs  Lab 06/13/20 0442 06/13/20 0638 06/14/20 0716 06/14/20 1105  06/15/20 0309 06/15/20 0659 06/16/20 0250 06/16/20 0720 06/17/20 0249 06/17/20 0717 06/18/20 0415 06/18/20 7322 06/18/20 1504 06/18/20 1845 06/18/20 2249 06/19/20 0252 06/19/20 1106  NA 120*   < > 124*   < > 126*   < > 126*   < > 127*   < > 132*   < > 130* 132* 132* 132* 135  K 3.9  --  5.2*  --  4.6  --  3.9  --  3.9  --  3.8  --   --   --   --   --   --   CL 89*  --  96*  --  96*  --  96*  --  96*  --  98  --   --   --   --   --   --   CO2 16*  --  13*  --  15*  --  16*  --  16*  --  20*  --   --   --   --   --   --   GLUCOSE 89  --  86  --  93  --  99  --  90  --  79  --   --   --   --   --   --   BUN 117*  --  127*  --  123*  --  122*  --  123*  --  87*  --   --   --   --   --   --  CREATININE 6.02*  --  6.05*  --  5.64*  --  5.46*  --  5.49*  --  4.23*  --   --   --   --   --   --   CALCIUM 6.0*  --  6.1*  --  6.0*  --  6.4*  --  6.8*  --  7.2*  --   --   --   --   --   --   MG 1.9  --  1.9  --  1.8  --   --   --   --   --   --   --   --   --   --   --   --   PHOS 10.0*  --  10.2*  --  9.0*  --   --   --   --   --   --   --   --   --   --   --   --    < > = values in this interval not displayed.     CBC: Recent Labs  Lab 06/15/20 0309 06/16/20 0250 06/17/20 0249 06/18/20 0415 06/19/20 0252  WBC 8.8 7.3 5.4 4.7 4.5  HGB 7.8* 7.5* 7.4* 7.8* 7.2*  HCT 21.2* 21.9* 21.9* 22.6* 21.7*  MCV 77.9* 81.7 82.6 81.3 83.5  PLT 290 292 267 255 200      Lab Results  Component Value Date   HEPBSAG NON REACTIVE 06/17/2020      Microbiology:  Recent Results (from the past 240 hour(s))  Resp Panel by RT-PCR (Flu A&B, Covid) Nasopharyngeal Swab     Status: None   Collection Time: 06/09/20  7:37 PM   Specimen: Nasopharyngeal Swab; Nasopharyngeal(NP) swabs in vial transport medium  Result Value Ref Range Status   SARS Coronavirus 2 by RT PCR NEGATIVE NEGATIVE Final    Comment: (NOTE) SARS-CoV-2 target nucleic acids are NOT DETECTED.  The SARS-CoV-2 RNA is generally  detectable in upper respiratory specimens during the acute phase of infection. The lowest concentration of SARS-CoV-2 viral copies this assay can detect is 138 copies/mL. A negative result does not preclude SARS-Cov-2 infection and should not be used as the sole basis for treatment or other patient management decisions. A negative result may occur with  improper specimen collection/handling, submission of specimen other than nasopharyngeal swab, presence of viral mutation(s) within the areas targeted by this assay, and inadequate number of viral copies(<138 copies/mL). A negative result must be combined with clinical observations, patient history, and epidemiological information. The expected result is Negative.  Fact Sheet for Patients:  EntrepreneurPulse.com.au  Fact Sheet for Healthcare Providers:  IncredibleEmployment.be  This test is no t yet approved or cleared by the [place] FDA and  has been authorized for detection and/or diagnosis of SARS-CoV-2 by FDA under an Emergency Use Authorization (EUA). This EUA will remain  in effect (meaning this test can be used) for the duration of the COVID-19 declaration under Section 564(b)(1) of the Act, 21 U.S.C.section 360bbb-3(b)(1), unless the authorization is terminated  or revoked sooner.       Influenza A by PCR NEGATIVE NEGATIVE Final   Influenza B by PCR NEGATIVE NEGATIVE Final    Comment: (NOTE) The Xpert Xpress SARS-CoV-2/FLU/RSV plus assay is intended as an aid in the diagnosis of influenza from Nasopharyngeal swab specimens and should not be used as a sole basis for treatment. Nasal washings and aspirates are unacceptable for Xpert Xpress SARS-CoV-2/FLU/RSV  testing.  Fact Sheet for Patients: EntrepreneurPulse.com.au  Fact Sheet for Healthcare Providers: IncredibleEmployment.be  This test is not yet approved or cleared by the [place] FDA  and has been authorized for detection and/or diagnosis of SARS-CoV-2 by FDA under an Emergency Use Authorization (EUA). This EUA will remain in effect (meaning this test can be used) for the duration of the COVID-19 declaration under Section 564(b)(1) of the Act, 21 U.S.C. section 360bbb-3(b)(1), unless the authorization is terminated or revoked.  Performed at [facility], [address] [place], [place] [address]   MRSA PCR Screening     Status: None   Collection Time: 06/10/20 12:24 AM   Specimen: Nasopharyngeal  Result Value Ref Range Status   MRSA by PCR NEGATIVE NEGATIVE Final    Comment:        The GeneXpert MRSA Assay (FDA approved for NASAL specimens only), is one component of a comprehensive MRSA colonization surveillance program. It is not intended to diagnose MRSA infection nor to guide or monitor treatment for MRSA infections. Performed at [facility], [address] [place], [place] [address]   Urine Culture     Status: None   Collection Time: 06/10/20  6:06 AM   Specimen: Urine, Random  Result Value Ref Range Status   Specimen Description   Final    URINE, RANDOM Performed at [facility], [address] [place], [place] [address]    Special Requests   Final    NONE Performed at [facility], [address], [place], [place] [address]    Culture   Final    NO GROWTH Performed at [facility], [place] [address], [place], [place] [address]    Report Status 06/12/2020 FINAL  Final  CULTURE, BLOOD (ROUTINE X 2) w Reflex to ID Panel     Status: None   Collection Time: 06/11/20 10:54 AM   Specimen: BLOOD RIGHT HAND  Result Value Ref Range Status   Specimen Description BLOOD RIGHT HAND  Final   Special Requests   Final    BOTTLES DRAWN AEROBIC AND ANAEROBIC Blood Culture results may not be optimal due to an excessive volume of blood received in culture bottles   Culture   Final    NO GROWTH 5 DAYS Performed  at [facility], [address] [place], [place] [address]    Report Status 06/16/2020 FINAL  Final  CULTURE, BLOOD (ROUTINE X 2) w Reflex to ID Panel     Status: None   Collection Time: 06/11/20 11:04 AM   Specimen: BLOOD RIGHT ARM  Result Value Ref Range Status   Specimen Description BLOOD RIGHT ARM  Final   Special Requests   Final    BOTTLES DRAWN AEROBIC AND ANAEROBIC Blood Culture results may not be optimal due to an excessive volume of blood received in culture bottles   Culture   Final    NO GROWTH 5 DAYS Performed at [facility], [address] [place], [place] [address]    Report Status 06/16/2020 FINAL  Final    Coagulation Studies: No results for input(s): LABPROT, INR in the last 72 hours.  Urinalysis: No results for input(s): COLORURINE, LABSPEC, PHURINE, GLUCOSEU, HGBUR, BILIRUBINUR, KETONESUR, PROTEINUR, UROBILINOGEN, NITRITE, LEUKOCYTESUR in the last 72 hours.  Invalid input(s): APPERANCEUR    Imaging: ECHOCARDIOGRAM COMPLETE  Result Date: 06/18/2020    ECHOCARDIOGRAM REPORT   Patient Name:   Carol Hicks Date of Exam: 06/17/2020 Medical Rec #:  [id number]       Height:       62.0 in Accession #:    [id number]      Weight:       179.7 lb Date of Birth:  [birth year]        BSA:          1.827 m Patient Age:    55 years        BP:           136/83 mmHg Patient Gender: F               HR:           80 bpm. Exam Location:  [facility] Procedure: 2D Echo, Cardiac Doppler and Color Doppler Indications:     R01.1 Murmur  History:         Patient has no prior history of Echocardiogram examinations.                  Lupus. Kidney disease.  Sonographer:     Wilford Sports Rodgers-Jones Referring Phys:  [id number] Nolberto Hanlon Diagnosing Phys: Ida Rogue MD IMPRESSIONS  1. Left ventricular ejection fraction, by estimation, is 60 to 65%. The left ventricle has normal function. The left ventricle has no regional wall motion abnormalities. There is mild to moderate left  ventricular hypertrophy. Left ventricular diastolic parameters are consistent with Grade II diastolic dysfunction (pseudonormalization).  2. Right ventricular systolic function is normal. The right ventricular size is normal.  3. The mitral valve is normal in structure. Mild mitral valve regurgitation.  4. Tricuspid valve regurgitation is mild to moderate. FINDINGS  Left Ventricle: Left ventricular ejection fraction, by estimation, is 60 to 65%. The left ventricle has normal function. The left ventricle has no regional wall motion abnormalities. The left ventricular internal cavity size was normal in size. There is  mild to moderate left ventricular hypertrophy. Left ventricular diastolic parameters are consistent with Grade II diastolic dysfunction (pseudonormalization). Right Ventricle: The right ventricular size is normal. No increase in right ventricular wall thickness. Right ventricular systolic function is normal. Left Atrium: Left atrial size was normal in size. Right Atrium: Right atrial size was normal in size. Pericardium: There is no evidence of pericardial effusion. Mitral Valve: The mitral valve is normal in structure. Mild mitral valve regurgitation. No evidence of mitral valve stenosis. Tricuspid Valve: The tricuspid valve is normal in structure. Tricuspid valve regurgitation is mild to moderate. No evidence of tricuspid stenosis. Aortic Valve: The aortic valve is normal in structure. Aortic valve regurgitation is not visualized. No aortic stenosis is present. Pulmonic Valve: The pulmonic valve was normal in structure. Pulmonic valve regurgitation is not visualized. No evidence of pulmonic stenosis. Aorta: The aortic root is normal in size and structure. Venous: The inferior vena cava is normal in size with greater than 50% respiratory variability, suggesting right atrial pressure of 3 mmHg. IAS/Shunts: No atrial level shunt detected by color flow Doppler.  LEFT VENTRICLE PLAX 2D LVIDd:         4.66  cm  Diastology LVIDs:         2.56 cm  LV e' medial:    6.74 cm/s LV PW:         1.18 cm  LV E/e' medial:  15.1 LV IVS:        0.98 cm  LV e' lateral:   10.80 cm/s LVOT diam:     1.90 cm  LV E/e' lateral: 9.4 LV SV:  73 LV SV Index:   40 LVOT Area:     2.84 cm  RIGHT VENTRICLE             IVC RV Basal diam:  3.85 cm     IVC diam: 1.66 cm RV S prime:     18.50 cm/s TAPSE (M-mode): 2.3 cm LEFT ATRIUM             Index       RIGHT ATRIUM           Index LA diam:        3.10 cm 1.70 cm/m  RA Area:     13.10 cm LA Vol (A2C):   33.3 ml 18.23 ml/m RA Volume:   32.50 ml  17.79 ml/m LA Vol (A4C):   35.3 ml 19.33 ml/m LA Biplane Vol: 37.2 ml 20.37 ml/m  AORTIC VALVE LVOT Vmax:   127.00 cm/s LVOT Vmean:  90.900 cm/s LVOT VTI:    0.258 m  AORTA Ao Root diam: 3.00 cm Ao Asc diam:  3.60 cm MITRAL VALVE                TRICUSPID VALVE MV Area (PHT): 3.31 cm     TR Peak grad:   25.8 mmHg MV Decel Time: 229 msec     TR Vmax:        254.00 cm/s MV E velocity: 102.00 cm/s MV A velocity: 95.40 cm/s   SHUNTS MV E/A ratio:  1.07         Systemic VTI:  0.26 m                             Systemic Diam: 1.90 cm Ida Rogue MD Electronically signed by Ida Rogue MD Signature Date/Time: 06/18/2020/10:18:41 AM    Final      Medications:   . sodium chloride Stopped (06/16/20 0352)   . Chlorhexidine Gluconate Cloth  6 each Topical Q0600  . ferrous sulfate  325 mg Oral QODAY  . heparin  5,000 Units Subcutaneous Q8H  . metoCLOPramide (REGLAN) injection  5 mg Intravenous Q8H  . mycophenolate  500 mg Oral BID  . pantoprazole (PROTONIX) IV  40 mg Intravenous QHS  . sevelamer carbonate  800 mg Oral TID WC  . sodium bicarbonate  650 mg Oral TID  . sodium chloride  1 g Oral BID WC   sodium chloride, acetaminophen, docusate sodium, HYDROmorphone (DILAUDID) injection, LORazepam, ondansetron (ZOFRAN) IV, ondansetron (ZOFRAN) IV, polyethylene glycol  Assessment/ Plan:  55 y.o. 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 EM with membranous pattern and extraglomerular immune deposits as well as some inflammation and fibrosis in the interstitium by EM. Favoring autoimmune process." Based on current trend and lack of sustainable renal recover, patient is now believed to be In end stage renal disease. Was on Cell cept, plaquenil and prednisone as outpatient  -Continues to have quite low renal function with a EGFR of 12.   - Placed Rt IJ Permcath 06/17/20 -  Nausea followed previous dialysis treatments  - Received third treatment today  - UF goal L achieved - May need treatment tomorrow for increased fluid removal - Dialysis liaison notified of discharge needs and will meet with patient today   #Hypertension  BP Readings from Last 1 Encounters:  06/19/20 (!) 149/77  Continues to be elevated Home medications currently held   #Hyperphosphatemia likely due to secondary hyperparathyroidism Calcium improved to 7.2 [place] with meals   LOS: [place] 4/15/20221:28 PM  [place], [place]

## 2020-06-19 NOTE — Progress Notes (Signed)
PROGRESS NOTE    Carol Hicks  [id number] DOB: [birth year] DOA: 06/09/2020 PCP: Tracie Harrier, MD    Brief Narrative:  This is a 55 yo female who presented to [facility] ER on 04/5 from home with seizure activity.  Upon review of records pt has a hx of lupus and is currently being worked up for possible lupus nephritis at [facility], and recently started CellCept 250 mg 2 capsules bid on 03/18.  There is concern pt may be close to ESRD and dialysis and/or kidney transplant.  During a previous hospitalization she was started on bumex 2 mg bid and sodium bicarb.  She was recently diagnosed with a viral URI with recommendations of supportive care and close outpatient follow-up on 03/25.  Pts daughter reported the pt has not been "acting the same" for about 2 weeks.  She also reported the pt went from a lying position to a seated position she had a seizure today that lasted for about 2-3 minutes. During the seizure the pts eyes rolled back and both her arms were shaking.  Following the seizure the pt appeared very sleepy for about 5-10 minutes.  EMS reported the pts vital signs were stable when they arrived.  ED Course Upon arrival to the ER pts daughter reported the pt appeared at her baseline, but the pt had no recollection of what happened prior to arrival in the ER.  Pt reported she has had emesis when taking her sodium bicarb tablet and loose stools over the past 24-36 hrs.  She also reported insomnia, on average only able to sleep 1-2 hrs per night over the past 2 weeks.  Lab results revealed Na+ 105, chloride 69, CO2 18, glucose 150, BUN 111, creatinine 6.24, calcium 6.9, anion gap 18, albumin 2.3, troponin 20, wbc 12.7, and hgb 8.8, platelets 440.  CT Head negative for acute intracranial findings or mass lesions.  She received 500 ml LR bolus.  ER physician contacted on call nephrologist Dr. Candiss Norse and he recommended the following: 3% saline 50 ml bolus x1 to raise Na+ above 110 with slow correction  thereafter (goal of ~8 Meq correction in 24hrs).  Received 100 ml bolus of hypertonic saline per ER physician.   Pt admitted to ICU with hyponatremia Na 105 received 3% saline 50 ml bolus  06/10/20: Remained on 3% Hypertonic saline, latest Na 112 this afternoon (Na 105 upon presentation @ 18:00 yesterday); per discussion with Nephrology will hold 3% Hypertonic saline and follow correction with PO intake 06/11/20: Serum Na remains low, placed on NS overnight. Placing back on 3% Saline by Nephrology, increasing WBC 22.9, CXR with left basilar opacity concerning for atelectasis vs. PNA, placed on empiric Azithromycin + Rocephin, cultures obtained 06/11/20: Serum Na remains below 120 (115), Hypertonic saline increased by Nephrology, Strep pneumo urinary antigen negative, all other cultures still pending.  06/13/2020: Patient noted to have significant anemia, consent obtained for transfusion.  Sodium 122, continue 3% saline at reduced dose of 20 mL/h. 06/14/2020: Transition from IV 3% hypertonic saline to p.o. saline tablets, 06/15/2020: Remains on PO salt tablets, Na slowly improving, up to 126 on am labs; consider transfer to Med-Surg if Nephrology on board, will transfer service to [facility] tomorrow  4/12-PCCM pickup today 4/13-plan for HD cath placement by vascular today 4/14-in dialysis today tolerating. 4/15-In HD today , tolerating so far. No overnight issues   Consultants:   Nephrology, PCCM, vascular  Procedures:   Antimicrobials:  06/11/20: Blood culture x2>>no growth to date 06/11/20:  Sputum>> 06/11/20: Strep pneumo urinary antigen>> negative 06/11/20: Legionella urinary antigen>>negative 06/11/20: Mycoplasma pneumoniae >> 06/11/20: Pneumocystis PCR>> 06/11/20: Urine>>no growth  Azithromycin 4/7>> Ceftriaxone 4/7>>  Subjective: Denies shortness of breath, chest pain, or any other complaints  Objective: Vitals:   06/18/20 1940 06/18/20 2331 06/19/20 0405 06/19/20 0617  BP: 137/71 140/74 131/67    Pulse: 86 83 89   Resp: '16 16 16   '$ Temp: 99.5 F (37.5 C) 98.7 F (37.1 C) 98.5 F (36.9 C)   TempSrc:  Oral Oral   SpO2: 100% 99% 97%   Weight:    79.7 kg  Height:        Intake/Output Summary (Last 24 hours) at 06/19/2020 0826 Last data filed at 06/19/2020 0105 Gross per 24 hour  Intake 360 ml  Output 1 ml  Net 359 ml   Filed Weights   06/17/20 0422 06/18/20 0500 06/19/20 0617  Weight: 81.5 kg 80.4 kg 79.7 kg    Examination: Calm, in dialysis, NAD CTA no wheeze rales rhonchi's Regular S1-S2 no gallops Soft benign positive bowel sounds Upper and lower extremity with generalized edema x4 Awake and alert, grossly intact Mood and affect appropriate in current setting   Data Reviewed: I have personally reviewed following labs and imaging studies  CBC: Recent Labs  Lab 06/15/20 0309 06/16/20 0250 06/17/20 0249 06/18/20 0415 06/19/20 0252  WBC 8.8 7.3 5.4 4.7 4.5  HGB 7.8* 7.5* 7.4* 7.8* 7.2*  HCT 21.2* 21.9* 21.9* 22.6* 21.7*  MCV 77.9* 81.7 82.6 81.3 83.5  PLT 290 292 267 255 [id number]   Basic Metabolic Panel: Recent Labs  Lab 06/13/20 0442 06/13/20 0638 06/14/20 0716 06/14/20 1105 06/15/20 0309 06/15/20 0659 06/16/20 0250 06/16/20 0720 06/17/20 0249 06/17/20 0717 06/18/20 0415 06/18/20 0648 06/18/20 1050 06/18/20 1504 06/18/20 1845 06/18/20 2249 06/19/20 0252  NA 120*   < > 124*   < > 126*   < > 126*   < > 127*   < > 132*   < > 134* 130* 132* 132* 132*  K 3.9  --  5.2*  --  4.6  --  3.9  --  3.9  --  3.8  --   --   --   --   --   --   CL 89*  --  96*  --  96*  --  96*  --  96*  --  98  --   --   --   --   --   --   CO2 16*  --  13*  --  15*  --  16*  --  16*  --  20*  --   --   --   --   --   --   GLUCOSE 89  --  86  --  93  --  99  --  90  --  79  --   --   --   --   --   --   BUN 117*  --  127*  --  123*  --  122*  --  123*  --  87*  --   --   --   --   --   --   CREATININE 6.02*  --  6.05*  --  5.64*  --  5.46*  --  5.49*  --  4.23*  --   --   --    --   --   --   CALCIUM 6.0*  --  6.1*  --  6.0*  --  6.4*  --  6.8*  --  7.2*  --   --   --   --   --   --   MG 1.9  --  1.9  --  1.8  --   --   --   --   --   --   --   --   --   --   --   --   PHOS 10.0*  --  10.2*  --  9.0*  --   --   --   --   --   --   --   --   --   --   --   --    < > = values in this interval not displayed.   GFR: Estimated Creatinine Clearance: 15 mL/min (A) (by C-G formula based on SCr of 4.23 mg/dL (H)). Liver Function Tests: Recent Labs  Lab 06/13/20 0442  ALBUMIN 1.8*   No results for input(s): LIPASE, AMYLASE in the last 168 hours. No results for input(s): AMMONIA in the last 168 hours. Coagulation Profile: No results for input(s): INR, PROTIME in the last 168 hours. Cardiac Enzymes: No results for input(s): CKTOTAL, CKMB, CKMBINDEX, TROPONINI in the last 168 hours. BNP (last 3 results) No results for input(s): PROBNP in the last 8760 hours. HbA1C: No results for input(s): HGBA1C in the last 72 hours. CBG: Recent Labs  Lab 06/15/20 2101  GLUCAP 120*   Lipid Profile: No results for input(s): CHOL, HDL, LDLCALC, TRIG, CHOLHDL, LDLDIRECT in the last 72 hours. Thyroid Function Tests: No results for input(s): TSH, T4TOTAL, FREET4, T3FREE, THYROIDAB in the last 72 hours. Anemia Panel: No results for input(s): VITAMINB12, FOLATE, FERRITIN, TIBC, IRON, RETICCTPCT in the last 72 hours. Sepsis Labs: Recent Labs  Lab 06/13/20 0442  PROCALCITON 3.56    Recent Results (from the past 240 hour(s))  Resp Panel by RT-PCR (Flu A&B, Covid) Nasopharyngeal Swab     Status: None   Collection Time: 06/09/20  7:37 PM   Specimen: Nasopharyngeal Swab; Nasopharyngeal(NP) swabs in vial transport medium  Result Value Ref Range Status   SARS Coronavirus 2 by RT PCR NEGATIVE NEGATIVE Final    Comment: (NOTE) SARS-CoV-2 target nucleic acids are NOT DETECTED.  The SARS-CoV-2 RNA is generally detectable in upper respiratory specimens during the acute phase of  infection. The lowest concentration of SARS-CoV-2 viral copies this assay can detect is 138 copies/mL. A negative result does not preclude SARS-Cov-2 infection and should not be used as the sole basis for treatment or other patient management decisions. A negative result may occur with  improper specimen collection/handling, submission of specimen other than nasopharyngeal swab, presence of viral mutation(s) within the areas targeted by this assay, and inadequate number of viral copies(<138 copies/mL). A negative result must be combined with clinical observations, patient history, and epidemiological information. The expected result is Negative.  Fact Sheet for Patients:  EntrepreneurPulse.com.au  Fact Sheet for Healthcare Providers:  IncredibleEmployment.be  This test is no t yet approved or cleared by the [place] FDA and  has been authorized for detection and/or diagnosis of SARS-CoV-2 by FDA under an Emergency Use Authorization (EUA). This EUA will remain  in effect (meaning this test can be used) for the duration of the COVID-19 declaration under Section 564(b)(1) of the Act, 21 U.S.C.section 360bbb-3(b)(1), unless the authorization is terminated  or revoked sooner.       Influenza A  by PCR NEGATIVE NEGATIVE Final   Influenza B by PCR NEGATIVE NEGATIVE Final    Comment: (NOTE) The Xpert Xpress SARS-CoV-2/FLU/RSV plus assay is intended as an aid in the diagnosis of influenza from Nasopharyngeal swab specimens and should not be used as a sole basis for treatment. Nasal washings and aspirates are unacceptable for Xpert Xpress SARS-CoV-2/FLU/RSV testing.  Fact Sheet for Patients: EntrepreneurPulse.com.au  Fact Sheet for Healthcare Providers: IncredibleEmployment.be  This test is not yet approved or cleared by the [place] FDA and has been authorized for detection and/or diagnosis of SARS-CoV-2  by FDA under an Emergency Use Authorization (EUA). This EUA will remain in effect (meaning this test can be used) for the duration of the COVID-19 declaration under Section 564(b)(1) of the Act, 21 U.S.C. section 360bbb-3(b)(1), unless the authorization is terminated or revoked.  Performed at [facility], [address] [place], [place] [address]   MRSA PCR Screening     Status: None   Collection Time: 06/10/20 12:24 AM   Specimen: Nasopharyngeal  Result Value Ref Range Status   MRSA by PCR NEGATIVE NEGATIVE Final    Comment:        The GeneXpert MRSA Assay (FDA approved for NASAL specimens only), is one component of a comprehensive MRSA colonization surveillance program. It is not intended to diagnose MRSA infection nor to guide or monitor treatment for MRSA infections. Performed at [facility], [address] [place], [place] [address]   Urine Culture     Status: None   Collection Time: 06/10/20  6:06 AM   Specimen: Urine, Random  Result Value Ref Range Status   Specimen Description   Final    URINE, RANDOM Performed at [facility], [address], [place], [place] [address]    Special Requests   Final    NONE Performed at [facility], [address], [place], [place] [address]    Culture   Final    NO GROWTH Performed at [facility], [place] [address] [place], [place] [address]    Report Status 06/12/2020 FINAL  Final  CULTURE, BLOOD (ROUTINE X 2) w Reflex to ID Panel     Status: None   Collection Time: 06/11/20 10:54 AM   Specimen: BLOOD RIGHT HAND  Result Value Ref Range Status   Specimen Description BLOOD RIGHT HAND  Final   Special Requests   Final    BOTTLES DRAWN AEROBIC AND ANAEROBIC Blood Culture results may not be optimal due to an excessive volume of blood received in culture bottles   Culture   Final    NO GROWTH 5 DAYS Performed at [facility], [address] [place], [place] [address]     Report Status 06/16/2020 FINAL  Final  CULTURE, BLOOD (ROUTINE X 2) w Reflex to ID Panel     Status: None   Collection Time: 06/11/20 11:04 AM   Specimen: BLOOD RIGHT ARM  Result Value Ref Range Status   Specimen Description BLOOD RIGHT ARM  Final   Special Requests   Final    BOTTLES DRAWN AEROBIC AND ANAEROBIC Blood Culture results may not be optimal due to an excessive volume of blood received in culture bottles   Culture   Final    NO GROWTH 5 DAYS Performed at [facility], [address], [place], [place] [address]    Report Status 06/16/2020 FINAL  Final         Radiology Studies: PERIPHERAL VASCULAR  CATHETERIZATION  Result Date: 06/17/2020 See op note  ECHOCARDIOGRAM COMPLETE  Result Date: 06/18/2020    ECHOCARDIOGRAM REPORT   Patient Name:   Carol Hicks Date of Exam: 06/17/2020 Medical Rec #:  [id number]       Height:       62.0 in Accession #:    [id number]      Weight:       179.7 lb Date of Birth:  02/20/1966        BSA:          1.827 m Patient Age:    17 years        BP:           136/83 mmHg Patient Gender: F               HR:           80 bpm. Exam Location:  [facility] Procedure: 2D Echo, Cardiac Doppler and Color Doppler Indications:     R01.1 Murmur  History:         Patient has no prior history of Echocardiogram examinations.                  Lupus. Kidney disease.  Sonographer:     Wilford Sports Rodgers-Jones Referring Phys:  [id number] Nolberto Hanlon Diagnosing Phys: Ida Rogue MD IMPRESSIONS  1. Left ventricular ejection fraction, by estimation, is 60 to 65%. The left ventricle has normal function. The left ventricle has no regional wall motion abnormalities. There is mild to moderate left ventricular hypertrophy. Left ventricular diastolic parameters are consistent with Grade II diastolic dysfunction (pseudonormalization).  2. Right ventricular systolic function is normal. The right ventricular size is normal.  3. The mitral valve is normal in structure. Mild mitral  valve regurgitation.  4. Tricuspid valve regurgitation is mild to moderate. FINDINGS  Left Ventricle: Left ventricular ejection fraction, by estimation, is 60 to 65%. The left ventricle has normal function. The left ventricle has no regional wall motion abnormalities. The left ventricular internal cavity size was normal in size. There is  mild to moderate left ventricular hypertrophy. Left ventricular diastolic parameters are consistent with Grade II diastolic dysfunction (pseudonormalization). Right Ventricle: The right ventricular size is normal. No increase in right ventricular wall thickness. Right ventricular systolic function is normal. Left Atrium: Left atrial size was normal in size. Right Atrium: Right atrial size was normal in size. Pericardium: There is no evidence of pericardial effusion. Mitral Valve: The mitral valve is normal in structure. Mild mitral valve regurgitation. No evidence of mitral valve stenosis. Tricuspid Valve: The tricuspid valve is normal in structure. Tricuspid valve regurgitation is mild to moderate. No evidence of tricuspid stenosis. Aortic Valve: The aortic valve is normal in structure. Aortic valve regurgitation is not visualized. No aortic stenosis is present. Pulmonic Valve: The pulmonic valve was normal in structure. Pulmonic valve regurgitation is not visualized. No evidence of pulmonic stenosis. Aorta: The aortic root is normal in size and structure. Venous: The inferior vena cava is normal in size with greater than 50% respiratory variability, suggesting right atrial pressure of 3 mmHg. IAS/Shunts: No atrial level shunt detected by color flow Doppler.  LEFT VENTRICLE PLAX 2D LVIDd:         4.66 cm  Diastology LVIDs:         2.56 cm  LV e' medial:    6.74 cm/s LV PW:         1.18 cm  LV E/e' medial:  15.1  LV IVS:        0.98 cm  LV e' lateral:   10.80 cm/s LVOT diam:     1.90 cm  LV E/e' lateral: 9.4 LV SV:         73 LV SV Index:   40 LVOT Area:     2.84 cm  RIGHT  VENTRICLE             IVC RV Basal diam:  3.85 cm     IVC diam: 1.66 cm RV S prime:     18.50 cm/s TAPSE (M-mode): 2.3 cm LEFT ATRIUM             Index       RIGHT ATRIUM           Index LA diam:        3.10 cm 1.70 cm/m  RA Area:     13.10 cm LA Vol (A2C):   33.3 ml 18.23 ml/m RA Volume:   32.50 ml  17.79 ml/m LA Vol (A4C):   35.3 ml 19.33 ml/m LA Biplane Vol: 37.2 ml 20.37 ml/m  AORTIC VALVE LVOT Vmax:   127.00 cm/s LVOT Vmean:  90.900 cm/s LVOT VTI:    0.258 m  AORTA Ao Root diam: 3.00 cm Ao Asc diam:  3.60 cm MITRAL VALVE                TRICUSPID VALVE MV Area (PHT): 3.31 cm     TR Peak grad:   25.8 mmHg MV Decel Time: 229 msec     TR Vmax:        254.00 cm/s MV E velocity: 102.00 cm/s MV A velocity: 95.40 cm/s   SHUNTS MV E/A ratio:  1.07         Systemic VTI:  0.26 m                             Systemic Diam: 1.90 cm Ida Rogue MD Electronically signed by Ida Rogue MD Signature Date/Time: 06/18/2020/10:18:41 AM    Final         Scheduled Meds: . Chlorhexidine Gluconate Cloth  6 each Topical Q0600  . ferrous sulfate  325 mg Oral QODAY  . heparin  5,000 Units Subcutaneous Q8H  . metoCLOPramide (REGLAN) injection  5 mg Intravenous Q8H  . mycophenolate  500 mg Oral BID  . pantoprazole (PROTONIX) IV  40 mg Intravenous QHS  . sevelamer carbonate  800 mg Oral TID WC  . sodium bicarbonate  650 mg Oral TID  . sodium chloride  1 g Oral BID WC   Continuous Infusions: . sodium chloride Stopped (06/16/20 0352)    Assessment & Plan:   Active Problems:   Hyponatremia   Severe hyponatremia and hypochloremia likely diuretic induced Anion gap metabolic acidosis  Seizure due to hyponatremia Likely due to excessive diuresis Seizure precautions as pt presented with seizure.   CT head negative for acute issues Nephrology following Received 500 cc LR in ED and 3% hypertonic saline small bolus and infusion off and on 4/15 sodium level improved and stable at 135  Continue with  hemodialysis and sodium bicarb tablets      Mildly elevated troponin likely secondary to demand ischemia in the setting of severe hyponatremia  -Trend troponin (20 ~ 16) 4/15-asymptomatic, no chest pain  Echo normal EF and no regional wall motion abnormality     Anemia without obvious acute blood loss  Continue ferrous sulfate Hemoglobin 7.2 Check a.m. 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 EM with membranous pattern and extraglomerular immune deposits as well as some inflammation and fibrosis in the interstitium by EM. Favoring autoimmune process." Was on Cell cept, plaquenil and prednisone as outpatient  4/13- plan for HD permacath today by vascular  4/15-HD today  May need treatment tomorrow for increased fluid removal  Per HD liaison Patient clinically and financially accepted at [place] 12:20. Patient can start on Tuesday 4/19 11:45am.      Leukocytosis Concern for Left Basilar Pneumonia on CXR 06/11/20 Leukocytosis improved Afebrile Blood cultures negative -Monitor fever curve  4/15-completed abx course with rocephin and azithrom. 5 day course 4/7-4/11     Hyperglycemia  -BG levels stable  Continue R-ISS BG level stable Continue Riss   Murmur-echo with mild to moderate TR  DVT prophylaxis: Heparin subcu Code Status: Full Family Communication: None at bedside at hemodialysis room  Status is: Inpatient  Remains inpatient appropriate because:Inpatient level of care appropriate due to severity of illness   Dispo: The patient is from: Home              Anticipated d/c is to: Home              Patient currently is not medically stable to d/c.   Difficult to place patient No            LOS: 10 days   Time  spent: 35 minutes with more than 50% on [place], MD [facility] Pager [phone]  If 7PM-7AM, please contact night-coverage 06/19/2020, 8:26 AM

## 2020-06-19 NOTE — Progress Notes (Signed)
[facility] admission requested SSN and copies of ID and insurance card. Documents and SSN given. Currently waiting on Insurance verification.

## 2020-06-19 NOTE — Progress Notes (Signed)
PT Cancellation Note  Patient Details Name: ASIJAH HLAVATY MRN: [id number] DOB: 1965-12-11   Cancelled Treatment:    Reason Eval/Treat Not Completed: Patient at procedure or test/unavailable. Patient at HD this pm. Will re-attempt to see patient when she is available.     Corbett Moulder 06/19/2020, 1:20 PM

## 2020-06-19 NOTE — TOC Progression Note (Addendum)
Transition of Care [facility]) - Progression Note    Patient Details  Name: Carol Hicks MRN: [id number] Date of Birth: 09-29-1965  Transition of Care [facility]) CM/SW [place], LCSW Phone Number: 06/19/2020, 8:59 AM  Clinical Narrative:   Reached out to [facility] with [facility] and requested confirmation that they can take patient with [facility] start of care Tuesday.   12:25- Tommi Rumps confirmed they can accept patient for Home Health with earliest start of care Tuesday.   Expected Discharge Plan: [place] Barriers to Discharge: Continued Medical Work up  Expected Discharge Plan and Services Expected Discharge Plan: [place] Acute Care Choice: [place]                   DME Arranged: 3-N-1 DME Agency: [employer] Date DME Agency Contacted: 06/18/20   Representative spoke with at DME Agency: [place]: RN,PT,OT           Social Determinants of Health (SDOH) Interventions    Readmission Risk Interventions No flowsheet data found.

## 2020-06-19 NOTE — Progress Notes (Signed)
Patient clinically and financially accepted at [place] 12:20. Patient can start on Tuesday 4/19 11:45am. Family and patient aware.

## 2020-06-19 NOTE — Plan of Care (Signed)
Continuing with plan of care. 

## 2020-06-20 DIAGNOSIS — R410 Disorientation, unspecified: Secondary | ICD-10-CM

## 2020-06-20 LAB — CBC
HCT: 20.1 % — ABNORMAL LOW (ref 36.0–46.0)
Hemoglobin: 6.8 g/dL — ABNORMAL LOW (ref 12.0–15.0)
MCH: 28 pg (ref 26.0–34.0)
MCHC: 33.8 g/dL (ref 30.0–36.0)
MCV: 82.7 fL (ref 80.0–100.0)
Platelets: 232 10*3/uL (ref 150–400)
RBC: 2.43 MIL/uL — ABNORMAL LOW (ref 3.87–5.11)
RDW: 12.6 % (ref 11.5–15.5)
WBC: 3.8 10*3/uL — ABNORMAL LOW (ref 4.0–10.5)
nRBC: 0 % (ref 0.0–0.2)

## 2020-06-20 LAB — BASIC METABOLIC PANEL
Anion gap: 9 (ref 5–15)
BUN: 30 mg/dL — ABNORMAL HIGH (ref 6–20)
CO2: 26 mmol/L (ref 22–32)
Calcium: 6.9 mg/dL — ABNORMAL LOW (ref 8.9–10.3)
Chloride: 99 mmol/L (ref 98–111)
Creatinine, Ser: 2.56 mg/dL — ABNORMAL HIGH (ref 0.44–1.00)
GFR, Estimated: 22 mL/min — ABNORMAL LOW (ref >60)
Glucose, Bld: 93 mg/dL (ref 70–99)
Potassium: 3.3 mmol/L — ABNORMAL LOW (ref 3.5–5.1)
Sodium: 134 mmol/L — ABNORMAL LOW (ref 135–145)

## 2020-06-20 LAB — GLUCOSE, CAPILLARY
Glucose-Capillary: 100 mg/dL — ABNORMAL HIGH (ref 70–99)
Glucose-Capillary: 89 mg/dL (ref 70–99)

## 2020-06-20 MED ORDER — PANTOPRAZOLE SODIUM 40 MG PO TBEC
40.0000 mg | DELAYED_RELEASE_TABLET | Freq: Every day | ORAL | Status: DC
  Administered 2020-06-20 – 2020-06-21 (×2): 40 mg via ORAL
  Filled 2020-06-20 (×2): qty 1

## 2020-06-20 MED ORDER — HEPARIN SODIUM (PORCINE) 1000 UNIT/ML IJ SOLN
2000.0000 [IU] | Freq: Once | INTRAMUSCULAR | Status: AC
  Administered 2020-06-20: 2000 [IU] via INTRAVENOUS

## 2020-06-20 MED ORDER — SODIUM CHLORIDE 0.9% FLUSH
10.0000 mL | Freq: Two times a day (BID) | INTRAVENOUS | Status: DC
  Administered 2020-06-20 – 2020-06-21 (×3): 10 mL via INTRAVENOUS

## 2020-06-20 MED ORDER — HEPARIN SODIUM (PORCINE) 1000 UNIT/ML IJ SOLN
4200.0000 [IU] | Freq: Once | INTRAMUSCULAR | Status: AC
  Administered 2020-06-20: 4200 [IU]

## 2020-06-20 NOTE — Plan of Care (Signed)
Continuing with plan of care. 

## 2020-06-20 NOTE — Progress Notes (Signed)
[facility]  ROUNDING NOTE   Subjective:     Objective:  Vital signs in last 24 hours:  Temp:  [98.2 F (36.8 C)-99.1 F (37.3 C)] 98.2 F (36.8 C) (04/16 1132) Pulse Rate:  [69-87] 87 (04/16 1132) Resp:  [14-20] 18 (04/16 1132) BP: (133-151)/(69-81) 143/81 (04/16 1132) SpO2:  [97 %-100 %] 97 % (04/16 1132)  Weight change:  Filed Weights   06/17/20 0422 06/18/20 0500 06/19/20 0617  Weight: 81.5 kg 80.4 kg 79.7 kg    Intake/Output: I/O last 3 completed shifts: In: 0  Out: 1000 [Other:1000]   Intake/Output this shift:  Total I/O In: 60 [P.O.:60] Out: -   Physical Exam: General: NAD,   Head: Normocephalic, atraumatic. Moist oral mucosal membranes  Eyes: Anicteric, PERRL  Neck: Supple, trachea midline  Lungs:  Clear to auscultation  Heart: Regular rate and rhythm  Abdomen:  Soft, nontender,   Extremities:  2-3+ peripheral edema.  Neurologic: Nonfocal, moving all four extremities  Skin: No lesions  Access: Right IJ perm cath     Basic Metabolic Panel: Recent Labs  Lab 06/14/20 0716 06/14/20 1105 06/15/20 0309 06/15/20 0659 06/16/20 0250 06/16/20 0720 06/17/20 0249 06/17/20 0717 06/18/20 0415 06/18/20 [id number] 06/18/20 2249 06/19/20 0252 06/19/20 1106 06/19/20 1505 06/20/20 0418  NA 124*   < > 126*   < > 126*   < > 127*   < > 132*   < > 132* 132* 135 135 134*  K 5.2*  --  4.6  --  3.9  --  3.9  --  3.8  --   --   --   --   --  3.3*  CL 96*  --  96*  --  96*  --  96*  --  98  --   --   --   --   --  99  CO2 13*  --  15*  --  16*  --  16*  --  20*  --   --   --   --   --  26  GLUCOSE 86  --  93  --  99  --  90  --  79  --   --   --   --   --  93  BUN 127*  --  123*  --  122*  --  123*  --  87*  --   --   --   --   --  30*  CREATININE 6.05*  --  5.64*  --  5.46*  --  5.49*  --  4.23*  --   --   --   --   --  2.56*  CALCIUM 6.1*  --  6.0*  --  6.4*  --  6.8*  --  7.2*  --   --   --   --   --  6.9*  MG 1.9  --  1.8  --   --   --   --   --   --    --   --   --   --   --   --   PHOS 10.2*  --  9.0*  --   --   --   --   --   --   --   --   --   --   --   --    < > = values in this interval not displayed.    Liver Function  Tests: No results for input(s): AST, ALT, ALKPHOS, BILITOT, PROT, ALBUMIN in the last 168 hours. No results for input(s): LIPASE, AMYLASE in the last 168 hours. No results for input(s): AMMONIA in the last 168 hours.  CBC: Recent Labs  Lab 06/16/20 0250 06/17/20 0249 06/18/20 0415 06/19/20 0252 06/20/20 0418  WBC 7.3 5.4 4.7 4.5 3.8*  HGB 7.5* 7.4* 7.8* 7.2* 6.8*  HCT 21.9* 21.9* 22.6* 21.7* 20.1*  MCV 81.7 82.6 81.3 83.5 82.7  PLT 292 267 255 200 232    Cardiac Enzymes: No results for input(s): CKTOTAL, CKMB, CKMBINDEX, TROPONINI in the last 168 hours.  BNP: Invalid input(s): POCBNP  CBG: Recent Labs  Lab 06/15/20 2101  GLUCAP 120*    Microbiology: Results for orders placed or performed during the hospital encounter of 06/09/20  Resp Panel by RT-PCR (Flu A&B, Covid) Nasopharyngeal Swab     Status: None   Collection Time: 06/09/20  7:37 PM   Specimen: Nasopharyngeal Swab; Nasopharyngeal(NP) swabs in vial transport medium  Result Value Ref Range Status   SARS Coronavirus 2 by RT PCR NEGATIVE NEGATIVE Final    Comment: (NOTE) SARS-CoV-2 target nucleic acids are NOT DETECTED.  The SARS-CoV-2 RNA is generally detectable in upper respiratory specimens during the acute phase of infection. The lowest concentration of SARS-CoV-2 viral copies this assay can detect is 138 copies/mL. A negative result does not preclude SARS-Cov-2 infection and should not be used as the sole basis for treatment or other patient management decisions. A negative result may occur with  improper specimen collection/handling, submission of specimen other than nasopharyngeal swab, presence of viral mutation(s) within the areas targeted by this assay, and inadequate number of viral copies(<138 copies/mL). A negative  result must be combined with clinical observations, patient history, and epidemiological information. The expected result is Negative.  Fact Sheet for Patients:  EntrepreneurPulse.com.au  Fact Sheet for Healthcare Providers:  IncredibleEmployment.be  This test is no t yet approved or cleared by the [place] FDA and  has been authorized for detection and/or diagnosis of SARS-CoV-2 by FDA under an Emergency Use Authorization (EUA). This EUA will remain  in effect (meaning this test can be used) for the duration of the COVID-19 declaration under Section 564(b)(1) of the Act, 21 U.S.C.section 360bbb-3(b)(1), unless the authorization is terminated  or revoked sooner.       Influenza A by PCR NEGATIVE NEGATIVE Final   Influenza B by PCR NEGATIVE NEGATIVE Final    Comment: (NOTE) The Xpert Xpress SARS-CoV-2/FLU/RSV plus assay is intended as an aid in the diagnosis of influenza from Nasopharyngeal swab specimens and should not be used as a sole basis for treatment. Nasal washings and aspirates are unacceptable for Xpert Xpress SARS-CoV-2/FLU/RSV testing.  Fact Sheet for Patients: EntrepreneurPulse.com.au  Fact Sheet for Healthcare Providers: IncredibleEmployment.be  This test is not yet approved or cleared by the [place] FDA and has been authorized for detection and/or diagnosis of SARS-CoV-2 by FDA under an Emergency Use Authorization (EUA). This EUA will remain in effect (meaning this test can be used) for the duration of the COVID-19 declaration under Section 564(b)(1) of the Act, 21 U.S.C. section 360bbb-3(b)(1), unless the authorization is terminated or revoked.  Performed at [facility], [address] [place], [place] [address]   MRSA PCR Screening     Status: None   Collection Time: 06/10/20 12:24 AM   Specimen: Nasopharyngeal  Result Value Ref Range Status   MRSA by PCR  NEGATIVE NEGATIVE Final  Comment:        The GeneXpert MRSA Assay (FDA approved for NASAL specimens only), is one component of a comprehensive MRSA colonization surveillance program. It is not intended to diagnose MRSA infection nor to guide or monitor treatment for MRSA infections. Performed at [facility], [address] [place], [place] [address]   Urine Culture     Status: None   Collection Time: 06/10/20  6:06 AM   Specimen: Urine, Random  Result Value Ref Range Status   Specimen Description   Final    URINE, RANDOM Performed at [facility], [address], [place], [place] [address]    Special Requests   Final    NONE Performed at [facility], [address], [place], [place] [address]    Culture   Final    NO GROWTH Performed at [facility], [place] [address] [place], [place] [address]    Report Status 06/12/2020 FINAL  Final  CULTURE, BLOOD (ROUTINE X 2) w Reflex to ID Panel     Status: None   Collection Time: 06/11/20 10:54 AM   Specimen: BLOOD RIGHT HAND  Result Value Ref Range Status   Specimen Description BLOOD RIGHT HAND  Final   Special Requests   Final    BOTTLES DRAWN AEROBIC AND ANAEROBIC Blood Culture results may not be optimal due to an excessive volume of blood received in culture bottles   Culture   Final    NO GROWTH 5 DAYS Performed at [facility], [address] [place], [place] [address]    Report Status 06/16/2020 FINAL  Final  CULTURE, BLOOD (ROUTINE X 2) w Reflex to ID Panel     Status: None   Collection Time: 06/11/20 11:04 AM   Specimen: BLOOD RIGHT ARM  Result Value Ref Range Status   Specimen Description BLOOD RIGHT ARM  Final   Special Requests   Final    BOTTLES DRAWN AEROBIC AND ANAEROBIC Blood Culture results may not be optimal due to an excessive volume of blood received in culture bottles   Culture   Final    NO GROWTH 5 DAYS Performed at [facility], [address], [place], [place] [address]    Report Status 06/16/2020 FINAL  Final    Coagulation Studies: No results for input(s): LABPROT, INR in the last 72 hours.  Urinalysis: No results for input(s): COLORURINE, LABSPEC, PHURINE, GLUCOSEU, HGBUR, BILIRUBINUR, KETONESUR, PROTEINUR, UROBILINOGEN, NITRITE, LEUKOCYTESUR in the last 72 hours.  Invalid input(s): APPERANCEUR    Imaging: No results found.   Medications:   . sodium chloride Stopped (06/16/20 0352)   . Chlorhexidine Gluconate Cloth  6 each Topical Q0600  . ferrous sulfate  325 mg Oral QODAY  . heparin  5,000 Units Subcutaneous Q8H  . metoCLOPramide (REGLAN) injection  5 mg Intravenous Q8H  . mycophenolate  500 mg Oral BID  . pantoprazole  40 mg Oral QHS  . sevelamer carbonate  800 mg Oral TID WC  . sodium bicarbonate  650 mg Oral TID  . sodium chloride  1 g Oral BID WC   sodium chloride, acetaminophen, docusate sodium, HYDROmorphone (DILAUDID) injection, LORazepam, ondansetron (ZOFRAN) IV, ondansetron (ZOFRAN) IV, polyethylene glycol  Assessment/ Plan:  Carol Hicks is a 55 y.o.  female with hypertension, lupus nephritis, chronic kidney disease stage V   admitted on 06/09/2020 for Hyponatremia ,Seizure ([place]) [R56.9], Acute renal failure, unspecified acute renal failure type ([place]) [N17.9]  1.  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 EM with membranous pattern and extraglomerular immune deposits as well as some inflammation and fibrosis in the interstitium by EM. Favoring autoimmune process." Based on current trend and lack of sustainable renal recover, patient is now believed to be In end stage renal disease. Was on Cell cept, plaquenil and prednisone as outpatient  -creatinine 2.56 and GFR of 22 today, improved slightly.    - Placed Rt IJ Permcath  06/17/20 - Received third treatment yesterday - Additional dialysis today for fluid removal. Albumin 1.8  - Dialysis liaison notified of discharge needs and will meet with patient today: planning on outpatient dialysis at [place].    2. Severe hyponatremia Likely due to excessive diuresis Presented with seizures -Improved Sodium 134 - Sodium tabs  3. Hypertension  - not currently on any antihypertensives.   4. Hyperparathyroidism due to secondary hyperparathyroidism  - calcium 6.8 - renvela with meals; may benefit from being switched to a calcium based binder.   5. Anemia of CKD  - hgb 6.8  - oral iron  - hold off on any blood transfusions at this time if possible.    LOS: [place] 4/16/202211:48 AM

## 2020-06-20 NOTE — Progress Notes (Signed)
PROGRESS NOTE    Carol Hicks  [id number] DOB: 10-15-1965 DOA: 06/09/2020 PCP: Tracie Harrier, MD    Brief Narrative:  This is a 55 yo female who presented to [facility] ER on 04/5 from home with seizure activity.  Upon review of records pt has a hx of lupus and is currently being worked up for possible lupus nephritis at [facility], and recently started CellCept 250 mg 2 capsules bid on 03/18.  There is concern pt may be close to ESRD and dialysis and/or kidney transplant.  During a previous hospitalization she was started on bumex 2 mg bid and sodium bicarb.  She was recently diagnosed with a viral URI with recommendations of supportive care and close outpatient follow-up on 03/25.  Pts daughter reported the pt has not been "acting the same" for about 2 weeks.  She also reported the pt went from a lying position to a seated position she had a seizure today that lasted for about 2-3 minutes. During the seizure the pts eyes rolled back and both her arms were shaking.  Following the seizure the pt appeared very sleepy for about 5-10 minutes.  EMS reported the pts vital signs were stable when they arrived.  ED Course Upon arrival to the ER pts daughter reported the pt appeared at her baseline, but the pt had no recollection of what happened prior to arrival in the ER.  Pt reported she has had emesis when taking her sodium bicarb tablet and loose stools over the past 24-36 hrs.  She also reported insomnia, on average only able to sleep 1-2 hrs per night over the past 2 weeks.  Lab results revealed Na+ 105, chloride 69, CO2 18, glucose 150, BUN 111, creatinine 6.24, calcium 6.9, anion gap 18, albumin 2.3, troponin 20, wbc 12.7, and hgb 8.8, platelets 440.  CT Head negative for acute intracranial findings or mass lesions.  She received 500 ml LR bolus.  ER physician contacted on call nephrologist Dr. Candiss [place]se and he recommended the following: 3% saline 50 ml bolus x1 to raise Na+ above 110 with slow correction  thereafter (goal of ~8 Meq correction in 24hrs).  Received 100 ml bolus of hypertonic saline per ER physician.   Pt admitted to ICU with hyponatremia Na 105 received 3% saline 50 ml bolus  06/10/20: Remained on 3% Hypertonic saline, latest Na 112 this afternoon (Na 105 upon presentation @ 18:00 yesterday); per discussion with Nephrology will hold 3% Hypertonic saline and follow correction with PO intake 06/11/20: Serum Na remains low, placed on NS overnight. Placing back on 3% Saline by Nephrology, increasing WBC 22.9, CXR with left basilar opacity concerning for atelectasis vs. PNA, placed on empiric Azithromycin + Rocephin, cultures obtained 06/11/20: Serum Na remains below 120 (115), Hypertonic saline increased by Nephrology, Strep pneumo urinary antigen negative, all other cultures still pending.  06/13/2020: Patient noted to have significant anemia, consent obtained for transfusion.  Sodium 122, continue 3% saline at reduced dose of 20 mL/h. 06/14/2020: Transition from IV 3% hypertonic saline to p.o. saline tablets, 06/15/2020: Remains on PO salt tablets, Na slowly improving, up to 126 on am labs; consider transfer to Med-Surg if Nephrology on board, will transfer service to [facility] tomorrow  4/12-PCCM pickup today 4/13-plan for HD cath placement by vascular today 4/14-in dialysis today tolerating. 4/15-In HD today , tolerating so far. No overnight issues 4/16 MS at baseline. Hg 6.8, Dr. Juleen China wanted to hold off on transfusion as he believes this is due to dilution  from fluid overload.  Plan for HD today  Consultants:   Nephrology, PCCM, vascular  Procedures:   Antimicrobials:  06/11/20: Blood culture x2>>no growth to date 06/11/20: Sputum>> 06/11/20: Strep pneumo urinary antigen>> negative 06/11/20: Legionella urinary antigen>>negative 06/11/20: Mycoplasma pneumoniae >> 06/11/20: Pneumocystis PCR>> 06/11/20: Urine>>no growth  Azithromycin 4/7>> Ceftriaxone 4/7>>  Subjective: Patient denies shortness  of breath, abdominal pain, dizziness.  Per daughter at bedside she is at baseline mental status  Objective: Vitals:   06/19/20 2331 06/20/20 0421 06/20/20 0814 06/20/20 1132  BP: (!) 145/76 (!) 151/72 (!) 149/75 (!) 143/81  Pulse: 79 84 81 87  Resp: '20 20 14 18  '$ Temp: 98.3 F (36.8 C) 99.1 F (37.3 C) 98.4 F (36.9 C) 98.2 F (36.8 C)  TempSrc: Oral Oral Oral Oral  SpO2: 100% 97% 97% 97%  Weight:      Height:        Intake/Output Summary (Last 24 hours) at 06/20/2020 1237 Last data filed at 06/20/2020 0959 Gross per 24 hour  Intake 60 ml  Output --  Net 60 ml   Filed Weights   06/17/20 0422 06/18/20 0500 06/19/20 0617  Weight: 81.5 kg 80.4 kg 79.7 kg    Examination: Calm, cooperative CTA no wheeze rales rhonchi's Regular S1-S2 no gallops Soft benign positive bowel sounds Edematous upper and lower extremities Alert oriented x3, grossly intact Mood and affect appropriate in current setting   Data Reviewed: I have personally reviewed following labs and imaging studies  CBC: Recent Labs  Lab 06/16/20 0250 06/17/20 0249 06/18/20 0415 06/19/20 0252 06/20/20 0418  WBC 7.3 5.4 4.7 4.5 3.8*  HGB 7.5* 7.4* 7.8* 7.2* 6.8*  HCT 21.9* 21.9* 22.6* 21.7* 20.1*  MCV 81.7 82.6 81.3 83.5 82.7  PLT 292 267 255 200 [id number]   Basic Metabolic Panel: Recent Labs  Lab 06/14/20 0716 06/14/20 1105 06/15/20 0309 06/15/20 0659 06/16/20 0250 06/16/20 0720 06/17/20 0249 06/17/20 0717 06/18/20 0415 06/18/20 [id number] 06/18/20 2249 06/19/20 0252 06/19/20 1106 06/19/20 1505 06/20/20 0418  NA 124*   < > 126*   < > 126*   < > 127*   < > 132*   < > 132* 132* 135 135 134*  K 5.2*  --  4.6  --  3.9  --  3.9  --  3.8  --   --   --   --   --  3.3*  CL 96*  --  96*  --  96*  --  96*  --  98  --   --   --   --   --  99  CO2 13*  --  15*  --  16*  --  16*  --  20*  --   --   --   --   --  26  GLUCOSE 86  --  93  --  99  --  90  --  79  --   --   --   --   --  93  BUN 127*  --  123*  --   122*  --  123*  --  87*  --   --   --   --   --  30*  CREATININE 6.05*  --  5.64*  --  5.46*  --  5.49*  --  4.23*  --   --   --   --   --  2.56*  CALCIUM 6.1*  --  6.0*  --  6.4*  --  6.8*  --  7.2*  --   --   --   --   --  6.9*  MG 1.9  --  1.8  --   --   --   --   --   --   --   --   --   --   --   --   PHOS 10.2*  --  9.0*  --   --   --   --   --   --   --   --   --   --   --   --    < > = values in this interval not displayed.   GFR: Estimated Creatinine Clearance: 24.8 mL/min (A) (by C-G formula based on SCr of 2.56 mg/dL (H)). Liver Function Tests: No results for input(s): AST, ALT, ALKPHOS, BILITOT, PROT, ALBUMIN in the last 168 hours. No results for input(s): LIPASE, AMYLASE in the last 168 hours. No results for input(s): AMMONIA in the last 168 hours. Coagulation Profile: No results for input(s): INR, PROTIME in the last 168 hours. Cardiac Enzymes: No results for input(s): CKTOTAL, CKMB, CKMBINDEX, TROPONINI in the last 168 hours. BNP (last 3 results) No results for input(s): PROBNP in the last 8760 hours. HbA1C: No results for input(s): HGBA1C in the last 72 hours. CBG: Recent Labs  Lab 06/15/20 2101  GLUCAP 120*   Lipid Profile: No results for input(s): CHOL, HDL, LDLCALC, TRIG, CHOLHDL, LDLDIRECT in the last 72 hours. Thyroid Function Tests: No results for input(s): TSH, T4TOTAL, FREET4, T3FREE, THYROIDAB in the last 72 hours. Anemia Panel: No results for input(s): VITAMINB12, FOLATE, FERRITIN, TIBC, IRON, RETICCTPCT in the last 72 hours. Sepsis Labs: No results for input(s): PROCALCITON, LATICACIDVEN in the last 168 hours.  Recent Results (from the past 240 hour(s))  CULTURE, BLOOD (ROUTINE X 2) w Reflex to ID Panel     Status: None   Collection Time: 06/11/20 10:54 AM   Specimen: BLOOD RIGHT HAND  Result Value Ref Range Status   Specimen Description BLOOD RIGHT HAND  Final   Special Requests   Final    BOTTLES DRAWN AEROBIC AND ANAEROBIC Blood Culture results  may not be optimal due to an excessive volume of blood received in culture bottles   Culture   Final    NO GROWTH 5 DAYS Performed at [facility], [address] [place], [place] [address]    Report Status 06/16/2020 FINAL  Final  CULTURE, BLOOD (ROUTINE X 2) w Reflex to ID Panel     Status: None   Collection Time: 06/11/20 11:04 AM   Specimen: BLOOD RIGHT ARM  Result Value Ref Range Status   Specimen Description BLOOD RIGHT ARM  Final   Special Requests   Final    BOTTLES DRAWN AEROBIC AND ANAEROBIC Blood Culture results may not be optimal due to an excessive volume of blood received in culture bottles   Culture   Final    NO GROWTH 5 DAYS Performed at [facility], [address] [place], [place] [address]    Report Status 06/16/2020 FINAL  Final         Radiology Studies: No results found.      Scheduled Meds: . Chlorhexidine Gluconate Cloth  6 each Topical Q0600  . ferrous sulfate  325 mg Oral QODAY  . heparin  5,000 Units Subcutaneous Q8H  . metoCLOPramide (REGLAN) injection  5 mg Intravenous Q8H  . mycophenolate  500 mg  Oral BID  . pantoprazole  40 mg Oral QHS  . sevelamer carbonate  800 mg Oral TID WC  . sodium bicarbonate  650 mg Oral TID  . sodium chloride  1 g Oral BID WC   Continuous Infusions: . sodium chloride Stopped (06/16/20 0352)    Assessment & Plan:   Active Problems:   Hyponatremia   Severe hyponatremia and hypochloremia likely diuretic induced Anion gap metabolic acidosis  Seizure due to hyponatremia Likely due to excessive diuresis Seizure precautions as pt presented with seizure.   CT head negative for acute issues Nephrology following Received 500 cc LR in ED and 3% hypertonic saline small bolus and infusion off and on 4/16 sodium level stable at 134 today  Continue with dialysis and sodium bicarb tabs  GERDSounds mild  Mildly elevated troponin likely secondary to demand ischemia in the setting of severe  hyponatremia  -Trend troponin (20 ~ 16) 4/16 asymptomatic, no chest pain  Echo normal EF and no regional wall motion abnormalities    Anemia without obvious acute blood loss  Continue ferrous sulfate  Hemoglobin 6.8, per nephrology this could be dilutional we will like to see what it is after dialysis and hold off on transfusion  If it does not improve and is less than 7 we will discuss giving patient packed red blood cell transfusion   Chronic kidney disease stage V due to lupus nephritis Full biopsy report is not available but outpatient notes state "Biopsy 05/2020: Limited sample for light and IF, but with extraglomerular immune deposits with polytypic staining. EM with membranous pattern and extraglomerular immune deposits as well as some inflammation and fibrosis in the interstitium by EM. Favoring autoimmune process." Was on Cell cept, plaquenil and prednisone as outpatient  Patient is now believed to be end-stage renal disease 4/13- plan for HD permacath today by vascular  4/16 albumin 1.8.  Additional dialysis today for fluid removal as patient is edematous  Per HD liaison patient clinically and financially accepted at [facility] 12:20.  Patient can start on Tuesday 4/19 at 11:45 AM      Leukocytosis Concern for Left Basilar Pneumonia on CXR 06/11/20 Leukocytosis improved Afebrile Blood cultures negative -Monitor fever curve  4/15-completed abx course with rocephin and azithrom. 5 day course 4/7-4/11     Hyperglycemia  -BG levels stable  Continue R-ISS BG level stable Continue Riss   Murmur-echo with mild to moderate TR  DVT prophylaxis: Heparin subcu Code Status: Full Family Communication: None at bedside at hemodialysis room  Status is: Inpatient  Remains inpatient appropriate because:Inpatient level of care appropriate due to severity of illness   Dispo: The patient is from: Home              Anticipated d/c is to: Home              Patient  currently is not medically stable to d/c.   Difficult to place patient No            LOS: 11 days   Time spent: 35 minutes with more than 50% on [place], MD [facility] Pager [phone]  If 7PM-7AM, please contact night-coverage 06/20/2020, 12:37 PM

## 2020-06-21 LAB — POTASSIUM: Potassium: 3.3 mmol/L — ABNORMAL LOW (ref 3.5–5.1)

## 2020-06-21 LAB — CBC
HCT: 21.2 % — ABNORMAL LOW (ref 36.0–46.0)
Hemoglobin: 7 g/dL — ABNORMAL LOW (ref 12.0–15.0)
MCH: 28.1 pg (ref 26.0–34.0)
MCHC: 33 g/dL (ref 30.0–36.0)
MCV: 85.1 fL (ref 80.0–100.0)
Platelets: 171 10*3/uL (ref 150–400)
RBC: 2.49 MIL/uL — ABNORMAL LOW (ref 3.87–5.11)
RDW: 12.6 % (ref 11.5–15.5)
WBC: 4.1 10*3/uL (ref 4.0–10.5)
nRBC: 0 % (ref 0.0–0.2)

## 2020-06-21 LAB — ALBUMIN: Albumin: 1.8 g/dL — ABNORMAL LOW (ref 3.5–5.0)

## 2020-06-21 MED ORDER — AMLODIPINE BESYLATE 5 MG PO TABS
5.0000 mg | ORAL_TABLET | Freq: Every day | ORAL | Status: DC
  Administered 2020-06-21 – 2020-06-22 (×2): 5 mg via ORAL
  Filled 2020-06-21 (×2): qty 1

## 2020-06-21 MED ORDER — POTASSIUM CHLORIDE CRYS ER 10 MEQ PO TBCR
10.0000 meq | EXTENDED_RELEASE_TABLET | Freq: Once | ORAL | Status: AC
  Administered 2020-06-21: 10 meq via ORAL
  Filled 2020-06-21: qty 1

## 2020-06-21 NOTE — Plan of Care (Signed)
  Problem: Education: Goal: Knowledge of General Education information will improve Description: Including pain rating scale, medication(s)/side effects and non-pharmacologic comfort measures Outcome: Progressing   Problem: Health Behavior/Discharge Planning: Goal: Ability to manage health-related needs will improve Outcome: Progressing   Problem: Activity: Goal: Risk for activity intolerance will decrease Outcome: Progressing   

## 2020-06-21 NOTE — Progress Notes (Signed)
[facility]  ROUNDING NOTE   Subjective:   Patient's daughter and husband present in the room. Patient husband reports that he is having to go back to work tomorrow and is wanting to know when the patient will be going home.  Patient reports dialysis went well yesterday and denies any cramping, shortness of breath. She thinks her edema has improved.   Translator used throughout conversation.   Objective:  Vital signs in last 24 hours:  Temp:  [98.2 F (36.8 C)-99 F (37.2 C)] 99 F (37.2 C) (04/17 0530) Pulse Rate:  [71-87] 87 (04/17 0530) Resp:  [13-23] 17 (04/17 0530) BP: (143-171)/(73-89) 160/79 (04/17 0530) SpO2:  [97 %-100 %] 97 % (04/17 0530) Weight:  [74.7 kg] 74.7 kg (04/16 2041)  Weight change:  Filed Weights   06/18/20 0500 06/19/20 0617 06/20/20 2041  Weight: 80.4 kg 79.7 kg 74.7 kg    Intake/Output: I/O last 3 completed shifts: In: 43 [P.O.:60] Out: 3000 [Other:3000]   Intake/Output this shift:  Total I/O In: 240 [P.O.:240] Out: -   Physical Exam: General: NAD, resting in bed   Head: Normocephalic, atraumatic. Moist oral mucosal membranes  Eyes: Anicteric, PERRL  Neck: Supple, trachea midline  Lungs:  Clear to auscultation  Heart: Regular rate and rhythm  Abdomen:  Soft, nontender,   Extremities:  2+ peripheral edema right LE, 1+ on left LE   Neurologic: Nonfocal, moving all four extremities  Skin: No lesions  Access: Right IJ perm cath     Basic Metabolic Panel: Recent Labs  Lab 06/15/20 0309 06/15/20 0659 06/16/20 0250 06/16/20 0720 06/17/20 0249 06/17/20 0717 06/18/20 0415 06/18/20 [id number] 06/18/20 2249 06/19/20 0252 06/19/20 1106 06/19/20 1505 06/20/20 0418 06/21/20 0640  NA 126*   < > 126*   < > 127*   < > 132*   < > 132* 132* 135 135 134*  --   K 4.6  --  3.9  --  3.9  --  3.8  --   --   --   --   --  3.3* 3.3*  CL 96*  --  96*  --  96*  --  98  --   --   --   --   --  99  --   CO2 15*  --  16*  --  16*  --  20*  --    --   --   --   --  26  --   GLUCOSE 93  --  99  --  90  --  79  --   --   --   --   --  93  --   BUN 123*  --  122*  --  123*  --  87*  --   --   --   --   --  30*  --   CREATININE 5.64*  --  5.46*  --  5.49*  --  4.23*  --   --   --   --   --  2.56*  --   CALCIUM 6.0*  --  6.4*  --  6.8*  --  7.2*  --   --   --   --   --  6.9*  --   MG 1.8  --   --   --   --   --   --   --   --   --   --   --   --   --  PHOS 9.0*  --   --   --   --   --   --   --   --   --   --   --   --   --    < > = values in this interval not displayed.    Liver Function Tests: No results for input(s): AST, ALT, ALKPHOS, BILITOT, PROT, ALBUMIN in the last 168 hours. No results for input(s): LIPASE, AMYLASE in the last 168 hours. No results for input(s): AMMONIA in the last 168 hours.  CBC: Recent Labs  Lab 06/17/20 0249 06/18/20 0415 06/19/20 0252 06/20/20 0418 06/21/20 0640  WBC 5.4 4.7 4.5 3.8* 4.1  HGB 7.4* 7.8* 7.2* 6.8* 7.0*  HCT 21.9* 22.6* 21.7* 20.1* 21.2*  MCV 82.6 81.3 83.5 82.7 85.1  PLT 267 255 200 232 171    Cardiac Enzymes: No results for input(s): CKTOTAL, CKMB, CKMBINDEX, TROPONINI in the last 168 hours.  BNP: Invalid input(s): POCBNP  CBG: Recent Labs  Lab 06/15/20 2101 06/20/20 2056 06/20/20 2341  GLUCAP 120* 53 100*    Microbiology: Results for orders placed or performed during the hospital encounter of 06/09/20  Resp Panel by RT-PCR (Flu A&B, Covid) Nasopharyngeal Swab     Status: None   Collection Time: 06/09/20  7:37 PM   Specimen: Nasopharyngeal Swab; Nasopharyngeal(NP) swabs in vial transport medium  Result Value Ref Range Status   SARS Coronavirus 2 by RT PCR NEGATIVE NEGATIVE Final    Comment: (NOTE) SARS-CoV-2 target nucleic acids are NOT DETECTED.  The SARS-CoV-2 RNA is generally detectable in upper respiratory specimens during the acute phase of infection. The lowest concentration of SARS-CoV-2 viral copies this assay can detect is 138 copies/mL. A negative  result does not preclude SARS-Cov-2 infection and should not be used as the sole basis for treatment or other patient management decisions. A negative result may occur with  improper specimen collection/handling, submission of specimen other than nasopharyngeal swab, presence of viral mutation(s) within the areas targeted by this assay, and inadequate number of viral copies(<138 copies/mL). A negative result must be combined with clinical observations, patient history, and epidemiological information. The expected result is Negative.  Fact Sheet for Patients:  EntrepreneurPulse.com.au  Fact Sheet for Healthcare Providers:  IncredibleEmployment.be  This test is no t yet approved or cleared by the [place] FDA and  has been authorized for detection and/or diagnosis of SARS-CoV-2 by FDA under an Emergency Use Authorization (EUA). This EUA will remain  in effect (meaning this test can be used) for the duration of the COVID-19 declaration under Section 564(b)(1) of the Act, 21 U.S.C.section 360bbb-3(b)(1), unless the authorization is terminated  or revoked sooner.       Influenza A by PCR NEGATIVE NEGATIVE Final   Influenza B by PCR NEGATIVE NEGATIVE Final    Comment: (NOTE) The Xpert Xpress SARS-CoV-2/FLU/RSV plus assay is intended as an aid in the diagnosis of influenza from Nasopharyngeal swab specimens and should not be used as a sole basis for treatment. Nasal washings and aspirates are unacceptable for Xpert Xpress SARS-CoV-2/FLU/RSV testing.  Fact Sheet for Patients: EntrepreneurPulse.com.au  Fact Sheet for Healthcare Providers: IncredibleEmployment.be  This test is not yet approved or cleared by the [place] FDA and has been authorized for detection and/or diagnosis of SARS-CoV-2 by FDA under an Emergency Use Authorization (EUA). This EUA will remain in effect (meaning this test can be used)  for the duration of the COVID-19 declaration under Section 564(b)(1) of  the Act, 21 U.S.C. section 360bbb-3(b)(1), unless the authorization is terminated or revoked.  Performed at [facility], [address] [place], [place] [address]   MRSA PCR Screening     Status: None   Collection Time: 06/10/20 12:24 AM   Specimen: Nasopharyngeal  Result Value Ref Range Status   MRSA by PCR NEGATIVE NEGATIVE Final    Comment:        The GeneXpert MRSA Assay (FDA approved for NASAL specimens only), is one component of a comprehensive MRSA colonization surveillance program. It is not intended to diagnose MRSA infection nor to guide or monitor treatment for MRSA infections. Performed at [facility], [address] [place], [place] [address]   Urine Culture     Status: None   Collection Time: 06/10/20  6:06 AM   Specimen: Urine, Random  Result Value Ref Range Status   Specimen Description   Final    URINE, RANDOM Performed at [facility], [address] [place], [place] [address]    Special Requests   Final    NONE Performed at [facility], [address], [place], [place] [address]    Culture   Final    NO GROWTH Performed at [facility], [place] [address], [place], [place] [address]    Report Status 06/12/2020 FINAL  Final  CULTURE, BLOOD (ROUTINE X 2) w Reflex to ID Panel     Status: None   Collection Time: 06/11/20 10:54 AM   Specimen: BLOOD RIGHT HAND  Result Value Ref Range Status   Specimen Description BLOOD RIGHT HAND  Final   Special Requests   Final    BOTTLES DRAWN AEROBIC AND ANAEROBIC Blood Culture results may not be optimal due to an excessive volume of blood received in culture bottles   Culture   Final    NO GROWTH 5 DAYS Performed at [facility], [address] [place], [place] [address]    Report Status 06/16/2020 FINAL  Final  CULTURE, BLOOD (ROUTINE X 2) w Reflex to ID Panel     Status: None    Collection Time: 06/11/20 11:04 AM   Specimen: BLOOD RIGHT ARM  Result Value Ref Range Status   Specimen Description BLOOD RIGHT ARM  Final   Special Requests   Final    BOTTLES DRAWN AEROBIC AND ANAEROBIC Blood Culture results may not be optimal due to an excessive volume of blood received in culture bottles   Culture   Final    NO GROWTH 5 DAYS Performed at [facility], [address], [place], [place] [address]    Report Status 06/16/2020 FINAL  Final    Coagulation Studies: No results for input(s): LABPROT, INR in the last 72 hours.  Urinalysis: No results for input(s): COLORURINE, LABSPEC, PHURINE, GLUCOSEU, HGBUR, BILIRUBINUR, KETONESUR, PROTEINUR, UROBILINOGEN, NITRITE, LEUKOCYTESUR in the last 72 hours.  Invalid input(s): APPERANCEUR    Imaging: No results found.   Medications:   . sodium chloride Stopped (06/16/20 0352)   . Chlorhexidine Gluconate Cloth  6 each Topical Q0600  . ferrous sulfate  325 mg Oral QODAY  . heparin  5,000 Units Subcutaneous Q8H  . metoCLOPramide (REGLAN) injection  5 mg Intravenous Q8H  . mycophenolate  500 mg Oral BID  . pantoprazole  40 mg Oral QHS  . sevelamer carbonate  800 mg Oral TID WC  . sodium bicarbonate  650 mg Oral TID  . sodium chloride flush  10 mL Intravenous  Q12H  . sodium chloride  1 g Oral BID WC   sodium chloride, acetaminophen, docusate sodium, HYDROmorphone (DILAUDID) injection, LORazepam, ondansetron (ZOFRAN) IV, ondansetron (ZOFRAN) IV, polyethylene glycol  Assessment/ Plan:  Ms. Carol Hicks is a 55 y.o.  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 EM with membranous pattern and extraglomerular immune deposits as well as some inflammation and fibrosis in the interstitium by EM. Favoring autoimmune process." Based on current trend and lack of sustainable renal recover, patient is now believed to be In end stage renal disease. Was on Cell cept, plaquenil and prednisone as outpatient  -creatinine 2.56 and GFR of 22 today, improved slightly.    - Placed Rt IJ Permcath 06/17/20 - Received additional treatment yesterday for fluid removal: 3L removed  -  Albumin 1.8 , obtain repeat albumin today. May need iv albumin with dialysis tomorrow. - plan for additional dialysis tomorrow sequential treatment for fluid removal.  - Dialysis liaison notified of discharge needs and will meet with patient today: planning on outpatient dialysis at [facility].    2. Severe hyponatremia Likely due to excessive diuresis Presented with seizures -Improved Sodium 134 - Sodium tabs  3. Hypertension  - not currently on any antihypertensives.   4. Hyperparathyroidism due to secondary hyperparathyroidism  - calcium 6.9 - renvela with meals; may benefit from being switched to a calcium based binder.   5. Anemia of CKD  - hgb 7.0 - oral iron  - hold off on any blood transfusions at this time if possible.    LOS: [place] 4/17/202210:45 AM

## 2020-06-21 NOTE — Progress Notes (Signed)
PROGRESS NOTE    Carol Hicks  [id number] DOB: [birth year] DOA: 06/09/2020 PCP: Tracie Harrier, MD    Brief Narrative:  This is a 55 yo female who presented to [facility] ER on 04/5 from home with seizure activity.  Upon review of records pt has a hx of lupus and is currently being worked up for possible lupus nephritis at [facility], and recently started CellCept 250 mg 2 capsules bid on 03/18.  There is concern pt may be close to ESRD and dialysis and/or kidney transplant.  During a previous hospitalization she was started on bumex 2 mg bid and sodium bicarb.  She was recently diagnosed with a viral URI with recommendations of supportive care and close outpatient follow-up on 03/25.  Pts daughter reported the pt has not been "acting the same" for about 2 weeks.  She also reported the pt went from a lying position to a seated position she had a seizure today that lasted for about 2-3 minutes. During the seizure the pts eyes rolled back and both her arms were shaking.  Following the seizure the pt appeared very sleepy for about 5-10 minutes.  EMS reported the pts vital signs were stable when they arrived.  ED Course Upon arrival to the ER pts daughter reported the pt appeared at her baseline, but the pt had no recollection of what happened prior to arrival in the ER.  Pt reported she has had emesis when taking her sodium bicarb tablet and loose stools over the past 24-36 hrs.  She also reported insomnia, on average only able to sleep 1-2 hrs per night over the past 2 weeks.  Lab results revealed Na+ 105, chloride 69, CO2 18, glucose 150, BUN 111, creatinine 6.24, calcium 6.9, anion gap 18, albumin 2.3, troponin 20, wbc 12.7, and hgb 8.8, platelets 440.  CT Head negative for acute intracranial findings or mass lesions.  She received 500 ml LR bolus.  ER physician contacted on call nephrologist Dr. Candiss Norse and he recommended the following: 3% saline 50 ml bolus x1 to raise Na+ above 110 with slow correction  thereafter (goal of ~8 Meq correction in 24hrs).  Received 100 ml bolus of hypertonic saline per ER physician.   Pt admitted to ICU with hyponatremia Na 105 received 3% saline 50 ml bolus  06/10/20: Remained on 3% Hypertonic saline, latest Na 112 this afternoon (Na 105 upon presentation @ 18:00 yesterday); per discussion with Nephrology will hold 3% Hypertonic saline and follow correction with PO intake 06/11/20: Serum Na remains low, placed on NS overnight. Placing back on 3% Saline by Nephrology, increasing WBC 22.9, CXR with left basilar opacity concerning for atelectasis vs. PNA, placed on empiric Azithromycin + Rocephin, cultures obtained 06/11/20: Serum Na remains below 120 (115), Hypertonic saline increased by Nephrology, Strep pneumo urinary antigen negative, all other cultures still pending.  06/13/2020: Patient noted to have significant anemia, consent obtained for transfusion.  Sodium 122, continue 3% saline at reduced dose of 20 mL/h. 06/14/2020: Transition from IV 3% hypertonic saline to p.o. saline tablets, 06/15/2020: Remains on PO salt tablets, Na slowly improving, up to 126 on am labs; consider transfer to Med-Surg if Nephrology on board, will transfer service to [facility] tomorrow  4/12-PCCM pickup today 4/13-plan for HD cath placement by vascular today 4/14-in dialysis today tolerating. 4/15-In HD today , tolerating so far. No overnight issues 4/16 MS at baseline. Hg 6.8, Dr. Juleen China wanted to hold off on transfusion as he believes this is due to dilution  from fluid overload.  Plan for HD today  4/17-still volume overloaded, plan for albumin iv with HD tomorrow.Yesterday had HD and 3L removed  Consultants:   Nephrology, PCCM, vascular  Procedures:   Antimicrobials:  06/11/20: Blood culture x2>>no growth to date 06/11/20: Sputum>> 06/11/20: Strep pneumo urinary antigen>> negative 06/11/20: Legionella urinary antigen>>negative 06/11/20: Mycoplasma pneumoniae >> 06/11/20: Pneumocystis  PCR>> 06/11/20: Urine>>no growth  Azithromycin 4/7>> Ceftriaxone 4/7>>  Subjective: No complaints of shortness of breath, chest pain, abdominal pain.  Family at bedside and state patient's mental status at baseline.  Objective: Vitals:   06/20/20 1905 06/20/20 2041 06/20/20 2341 06/21/20 0530  BP: (!) 152/77 (!) 160/73 (!) 159/80 (!) 160/79  Pulse: 73 77 82 87  Resp:  '19 17 17  '$ Temp:  98.6 F (37 C) 98.3 F (36.8 C) 99 F (37.2 C)  TempSrc:  Oral    SpO2: 100% 100% 100% 97%  Weight:  74.7 kg    Height:        Intake/Output Summary (Last 24 hours) at 06/21/2020 0829 Last data filed at 06/20/2020 1900 Gross per 24 hour  Intake 60 ml  Output 3000 ml  Net -2940 ml   Filed Weights   06/18/20 0500 06/19/20 0617 06/20/20 2041  Weight: 80.4 kg 79.7 kg 74.7 kg    Examination: Calm, NAD CTA no wheeze rales rhonchi or Regular S1-S2 no gallops Soft benign positive bowel sounds Generalized edema x4 Alert oriented x3 grossly intact Mood and affect appropriate   Data Reviewed: I have personally reviewed following labs and imaging studies  CBC: Recent Labs  Lab 06/17/20 0249 06/18/20 0415 06/19/20 0252 06/20/20 0418 06/21/20 0640  WBC 5.4 4.7 4.5 3.8* 4.1  HGB 7.4* 7.8* 7.2* 6.8* 7.0*  HCT 21.9* 22.6* 21.7* 20.1* 21.2*  MCV 82.6 81.3 83.5 82.7 85.1  PLT 267 255 200 232 [id number]   Basic Metabolic Panel: Recent Labs  Lab 06/15/20 0309 06/15/20 0659 06/16/20 0250 06/16/20 0720 06/17/20 0249 06/17/20 0717 06/18/20 0415 06/18/20 [id number] 06/18/20 2249 06/19/20 0252 06/19/20 1106 06/19/20 1505 06/20/20 0418 06/21/20 0640  NA 126*   < > 126*   < > 127*   < > 132*   < > 132* 132* 135 135 134*  --   K 4.6  --  3.9  --  3.9  --  3.8  --   --   --   --   --  3.3* 3.3*  CL 96*  --  96*  --  96*  --  98  --   --   --   --   --  99  --   CO2 15*  --  16*  --  16*  --  20*  --   --   --   --   --  26  --   GLUCOSE 93  --  99  --  90  --  79  --   --   --   --   --  93  --    BUN 123*  --  122*  --  123*  --  87*  --   --   --   --   --  30*  --   CREATININE 5.64*  --  5.46*  --  5.49*  --  4.23*  --   --   --   --   --  2.56*  --   CALCIUM 6.0*  --  6.4*  --  6.8*  --  7.2*  --   --   --   --   --  6.9*  --   MG 1.8  --   --   --   --   --   --   --   --   --   --   --   --   --   PHOS 9.0*  --   --   --   --   --   --   --   --   --   --   --   --   --    < > = values in this interval not displayed.   GFR: Estimated Creatinine Clearance: 24 mL/min (A) (by C-G formula based on SCr of 2.56 mg/dL (H)). Liver Function Tests: No results for input(s): AST, ALT, ALKPHOS, BILITOT, PROT, ALBUMIN in the last 168 hours. No results for input(s): LIPASE, AMYLASE in the last 168 hours. No results for input(s): AMMONIA in the last 168 hours. Coagulation Profile: No results for input(s): INR, PROTIME in the last 168 hours. Cardiac Enzymes: No results for input(s): CKTOTAL, CKMB, CKMBINDEX, TROPONINI in the last 168 hours. BNP (last 3 results) No results for input(s): PROBNP in the last 8760 hours. HbA1C: No results for input(s): HGBA1C in the last 72 hours. CBG: Recent Labs  Lab 06/15/20 2101 06/20/20 2056 06/20/20 2341  GLUCAP 120* 89 100*   Lipid Profile: No results for input(s): CHOL, HDL, LDLCALC, TRIG, CHOLHDL, LDLDIRECT in the last 72 hours. Thyroid Function Tests: No results for input(s): TSH, T4TOTAL, FREET4, T3FREE, THYROIDAB in the last 72 hours. Anemia Panel: No results for input(s): VITAMINB12, FOLATE, FERRITIN, TIBC, IRON, RETICCTPCT in the last 72 hours. Sepsis Labs: No results for input(s): PROCALCITON, LATICACIDVEN in the last 168 hours.  Recent Results (from the past 240 hour(s))  CULTURE, BLOOD (ROUTINE X 2) w Reflex to ID Panel     Status: None   Collection Time: 06/11/20 10:54 AM   Specimen: BLOOD RIGHT HAND  Result Value Ref Range Status   Specimen Description BLOOD RIGHT HAND  Final   Special Requests   Final    BOTTLES DRAWN AEROBIC  AND ANAEROBIC Blood Culture results may not be optimal due to an excessive volume of blood received in culture bottles   Culture   Final    NO GROWTH 5 DAYS Performed at [facility], [address] [place], [place] [address]    Report Status 06/16/2020 FINAL  Final  CULTURE, BLOOD (ROUTINE X 2) w Reflex to ID Panel     Status: None   Collection Time: 06/11/20 11:04 AM   Specimen: BLOOD RIGHT ARM  Result Value Ref Range Status   Specimen Description BLOOD RIGHT ARM  Final   Special Requests   Final    BOTTLES DRAWN AEROBIC AND ANAEROBIC Blood Culture results may not be optimal due to an excessive volume of blood received in culture bottles   Culture   Final    NO GROWTH 5 DAYS Performed at [facility], [address] [place], [place] [address]    Report Status 06/16/2020 FINAL  Final         Radiology Studies: No results found.      Scheduled Meds: . Chlorhexidine Gluconate Cloth  6 each Topical Q0600  . ferrous sulfate  325 mg Oral QODAY  . heparin  5,000 Units Subcutaneous Q8H  . metoCLOPramide (REGLAN) injection  5 mg Intravenous  Q8H  . mycophenolate  500 mg Oral BID  . pantoprazole  40 mg Oral QHS  . sevelamer carbonate  800 mg Oral TID WC  . sodium bicarbonate  650 mg Oral TID  . sodium chloride flush  10 mL Intravenous Q12H  . sodium chloride  1 g Oral BID WC   Continuous Infusions: . sodium chloride Stopped (06/16/20 0352)    Assessment & Plan:   Active Problems:   Hyponatremia   Severe hyponatremia and hypochloremia likely diuretic induced Anion gap metabolic acidosis  Seizure due to hyponatremia Likely due to excessive diuresis Seizure precautions as pt presented with seizure.   CT head negative for acute issues Nephrology following Received 500 cc LR in ED and 3% hypertonic saline small bolus and infusion off and on 4/17sodium level stablized Continue HD and sodium bicarb tablets   Mildly elevated troponin likely  secondary to demand ischemia in the setting of severe hyponatremia  -Trend troponin (20 ~ 16) 4/17-asx. No cp Echo nml EF and No RWMA    Anemia without obvious acute blood loss  Continue ferrous sulfate  Hemoglobin 6.8, per nephrology this could be dilutional we will like to see what it is after dialysis and hold off on transfusion  4/17-Hg 7 today, will hold off on transfusion per nephrology Continue with Hd Possibly dilutional   Chronic kidney disease stage V due to lupus nephritis Full biopsy report is not available but outpatient notes state "Biopsy 05/2020: Limited sample for light and IF, but with extraglomerular immune deposits with polytypic staining. EM with membranous pattern and extraglomerular immune deposits as well as some inflammation and fibrosis in the interstitium by EM. Favoring autoimmune process." Was on Cell cept, plaquenil and prednisone as outpatient  Patient is now believed to be end-stage renal disease Placed RT IJ permacath on 4/13 4/17- HD yesterday, had 3 L removed Plan for dialysis tomorrow with IV albumin HD liaison has been accepted to [facility].  Patient can start on treatment today 4/19 at 1145.  May need to push it back if she still hospitalized      Leukocytosis Concern for Left Basilar Pneumonia on CXR 06/11/20 Leukocytosis improved Afebrile Blood cultures negative -Monitor fever curve  completed abx course with rocephin and azithrom. 5 day course 4/7-4/11     Hyperglycemia  -BG levels stable  Continue R-ISS BG level stable Continue Riss   Murmur-echo with mild to moderate TR  DVT prophylaxis: Heparin subcu Code Status: Full Family Communication: None at bedside at hemodialysis room  Status is: Inpatient  Remains inpatient appropriate because:Inpatient level of care appropriate due to severity of illness   Dispo: The patient is from: Home              Anticipated d/c is to: Home              Patient currently is not  medically stable to d/c.   Difficult to place patient No            LOS: 12 days   Time spent: 35 minutes with more than 50% on [place], MD [facility] Pager [phone]  If 7PM-7AM, please contact night-coverage 06/21/2020, 8:29 AM

## 2020-06-22 DIAGNOSIS — M3214 Glomerular disease in systemic lupus erythematosus: Secondary | ICD-10-CM

## 2020-06-22 DIAGNOSIS — D638 Anemia in other chronic diseases classified elsewhere: Secondary | ICD-10-CM

## 2020-06-22 LAB — POTASSIUM: Potassium: 3.3 mmol/L — ABNORMAL LOW (ref 3.5–5.1)

## 2020-06-22 MED ORDER — SEVELAMER CARBONATE 800 MG PO TABS: 800.0000 mg | ORAL_TABLET | Freq: Three times a day (TID) | ORAL | 0 refills | Status: AC

## 2020-06-22 MED ORDER — PANTOPRAZOLE SODIUM 40 MG PO TBEC: 40.0000 mg | DELAYED_RELEASE_TABLET | Freq: Every day | ORAL | 0 refills | Status: DC

## 2020-06-22 MED ORDER — PREDNISONE 10 MG PO TABS: 10.0000 mg | ORAL_TABLET | Freq: Every day | ORAL | 0 refills | Status: AC

## 2020-06-22 MED ORDER — CHLORHEXIDINE GLUCONATE CLOTH 2 % EX PADS: 6.0000 | MEDICATED_PAD | Freq: Every day | CUTANEOUS | Status: DC

## 2020-06-22 NOTE — Progress Notes (Signed)
[facility]  ROUNDING NOTE   Subjective:   Seen and examined on hemodialysis treatment. Tolerating treatment well. Sequential only.  Chief Operating Officer used.    HEMODIALYSIS FLOWSHEET:  Blood Flow Rate (mL/min): 200 mL/min Arterial Pressure (mmHg): -150 mmHg Venous Pressure (mmHg): 100 mmHg Transmembrane Pressure (mmHg): 20 mmHg Ultrafiltration Rate (mL/min): 1000 mL/min Dialysate Flow Rate (mL/min):  (Sequential) Conductivity: Machine : 14.1 Conductivity: Machine : 14.1 Dialysis Fluid Bolus: Normal Saline Bolus Amount (mL): 300 mL Dialysate Change: Other (comment)    Objective:  Vital signs in last 24 hours:  Temp:  [98.2 F (36.8 C)-99.2 F (37.3 C)] 98.7 F (37.1 C) (04/18 0910) Pulse Rate:  [69-95] 72 (04/18 1230) Resp:  [9-20] 15 (04/18 1230) BP: (149-173)/(73-90) 167/82 (04/18 1230) SpO2:  [97 %-100 %] 99 % (04/18 0910) Weight:  [76 kg] 76 kg (04/18 0517)  Weight change: 1.292 kg Filed Weights   06/19/20 0617 06/20/20 2041 06/22/20 0517  Weight: 79.7 kg 74.7 kg 76 kg    Intake/Output: I/O last 3 completed shifts: In: 360 [P.O.:360] Out: -    Intake/Output this shift:  Total I/O In: -  Out: 2500 [Other:2500]  Physical Exam: General: NAD, laying in bed   Head: Normocephalic, atraumatic. Moist oral mucosal membranes  Eyes: Anicteric, PERRL  Neck: Supple, trachea midline  Lungs:  Clear to auscultation  Heart: Regular rate and rhythm  Abdomen:  Soft, nontender,   Extremities:  2+ peripheral edema right LE, 1+ on left LE   Neurologic: Nonfocal, moving all four extremities  Skin: No lesions  Access: Right IJ perm cath     Basic Metabolic Panel: Recent Labs  Lab 06/16/20 0250 06/16/20 0720 06/17/20 0249 06/17/20 0717 06/18/20 0415 06/18/20 [id number] 06/18/20 2249 06/19/20 0252 06/19/20 1106 06/19/20 1505 06/20/20 0418 06/21/20 0640 06/22/20 0409  NA 126*   < > 127*   < > 132*   < > 132* 132* 135 135 134*  --   --   K 3.9  --   3.9  --  3.8  --   --   --   --   --  3.3* 3.3* 3.3*  CL 96*  --  96*  --  98  --   --   --   --   --  99  --   --   CO2 16*  --  16*  --  20*  --   --   --   --   --  26  --   --   GLUCOSE 99  --  90  --  79  --   --   --   --   --  93  --   --   BUN 122*  --  123*  --  87*  --   --   --   --   --  30*  --   --   CREATININE 5.46*  --  5.49*  --  4.23*  --   --   --   --   --  2.56*  --   --   CALCIUM 6.4*  --  6.8*  --  7.2*  --   --   --   --   --  6.9*  --   --    < > = values in this interval not displayed.    Liver Function Tests: Recent Labs  Lab 06/21/20 1059  ALBUMIN 1.8*   No results for input(s): LIPASE,  AMYLASE in the last 168 hours. No results for input(s): AMMONIA in the last 168 hours.  CBC: Recent Labs  Lab 06/17/20 0249 06/18/20 0415 06/19/20 0252 06/20/20 0418 06/21/20 0640  WBC 5.4 4.7 4.5 3.8* 4.1  HGB 7.4* 7.8* 7.2* 6.8* 7.0*  HCT 21.9* 22.6* 21.7* 20.1* 21.2*  MCV 82.6 81.3 83.5 82.7 85.1  PLT 267 255 200 232 171    Cardiac Enzymes: No results for input(s): CKTOTAL, CKMB, CKMBINDEX, TROPONINI in the last 168 hours.  BNP: Invalid input(s): POCBNP  CBG: Recent Labs  Lab 06/15/20 2101 06/20/20 2056 06/20/20 2341  GLUCAP 120* 66 100*    Microbiology: Results for orders placed or performed during the hospital encounter of 06/09/20  Resp Panel by RT-PCR (Flu A&B, Covid) Nasopharyngeal Swab     Status: None   Collection Time: 06/09/20  7:37 PM   Specimen: Nasopharyngeal Swab; Nasopharyngeal(NP) swabs in vial transport medium  Result Value Ref Range Status   SARS Coronavirus 2 by RT PCR NEGATIVE NEGATIVE Final    Comment: (NOTE) SARS-CoV-2 target nucleic acids are NOT DETECTED.  The SARS-CoV-2 RNA is generally detectable in upper respiratory specimens during the acute phase of infection. The lowest concentration of SARS-CoV-2 viral copies this assay can detect is 138 copies/mL. A negative result does not preclude SARS-Cov-2 infection and  should not be used as the sole basis for treatment or other patient management decisions. A negative result may occur with  improper specimen collection/handling, submission of specimen other than nasopharyngeal swab, presence of viral mutation(s) within the areas targeted by this assay, and inadequate number of viral copies(<138 copies/mL). A negative result must be combined with clinical observations, patient history, and epidemiological information. The expected result is Negative.  Fact Sheet for Patients:  EntrepreneurPulse.com.au  Fact Sheet for Healthcare Providers:  IncredibleEmployment.be  This test is no t yet approved or cleared by the [place] FDA and  has been authorized for detection and/or diagnosis of SARS-CoV-2 by FDA under an Emergency Use Authorization (EUA). This EUA will remain  in effect (meaning this test can be used) for the duration of the COVID-19 declaration under Section 564(b)(1) of the Act, 21 U.S.C.section 360bbb-3(b)(1), unless the authorization is terminated  or revoked sooner.       Influenza A by PCR NEGATIVE NEGATIVE Final   Influenza B by PCR NEGATIVE NEGATIVE Final    Comment: (NOTE) The Xpert Xpress SARS-CoV-2/FLU/RSV plus assay is intended as an aid in the diagnosis of influenza from Nasopharyngeal swab specimens and should not be used as a sole basis for treatment. Nasal washings and aspirates are unacceptable for Xpert Xpress SARS-CoV-2/FLU/RSV testing.  Fact Sheet for Patients: EntrepreneurPulse.com.au  Fact Sheet for Healthcare Providers: IncredibleEmployment.be  This test is not yet approved or cleared by the [place] FDA and has been authorized for detection and/or diagnosis of SARS-CoV-2 by FDA under an Emergency Use Authorization (EUA). This EUA will remain in effect (meaning this test can be used) for the duration of the COVID-19 declaration  under Section 564(b)(1) of the Act, 21 U.S.C. section 360bbb-3(b)(1), unless the authorization is terminated or revoked.  Performed at [facility], [address] [place], [place] [address]   MRSA PCR Screening     Status: None   Collection Time: 06/10/20 12:24 AM   Specimen: Nasopharyngeal  Result Value Ref Range Status   MRSA by PCR NEGATIVE NEGATIVE Final    Comment:        The GeneXpert MRSA Assay (FDA  approved for NASAL specimens only), is one component of a comprehensive MRSA colonization surveillance program. It is not intended to diagnose MRSA infection nor to guide or monitor treatment for MRSA infections. Performed at [facility], [address], [place], [place] [address]   Urine Culture     Status: None   Collection Time: 06/10/20  6:06 AM   Specimen: Urine, Random  Result Value Ref Range Status   Specimen Description   Final    URINE, RANDOM Performed at [facility], [address], [place], [place] [address]    Special Requests   Final    NONE Performed at [facility], [address] [place], [place] [address]    Culture   Final    NO GROWTH Performed at [facility], [place] [address] [place], [place] [address]    Report Status 06/12/2020 FINAL  Final  CULTURE, BLOOD (ROUTINE X 2) w Reflex to ID Panel     Status: None   Collection Time: 06/11/20 10:54 AM   Specimen: BLOOD RIGHT HAND  Result Value Ref Range Status   Specimen Description BLOOD RIGHT HAND  Final   Special Requests   Final    BOTTLES DRAWN AEROBIC AND ANAEROBIC Blood Culture results may not be optimal due to an excessive volume of blood received in culture bottles   Culture   Final    NO GROWTH 5 DAYS Performed at [facility], [address] [place], [place] [address]    Report Status 06/16/2020 FINAL  Final  CULTURE, BLOOD (ROUTINE X 2) w Reflex to ID Panel     Status: None   Collection Time: 06/11/20 11:04 AM   Specimen:  BLOOD RIGHT ARM  Result Value Ref Range Status   Specimen Description BLOOD RIGHT ARM  Final   Special Requests   Final    BOTTLES DRAWN AEROBIC AND ANAEROBIC Blood Culture results may not be optimal due to an excessive volume of blood received in culture bottles   Culture   Final    NO GROWTH 5 DAYS Performed at [facility], [address] [place], [place] [address]    Report Status 06/16/2020 FINAL  Final    Coagulation Studies: No results for input(s): LABPROT, INR in the last 72 hours.  Urinalysis: No results for input(s): COLORURINE, LABSPEC, PHURINE, GLUCOSEU, HGBUR, BILIRUBINUR, KETONESUR, PROTEINUR, UROBILINOGEN, NITRITE, LEUKOCYTESUR in the last 72 hours.  Invalid input(s): APPERANCEUR    Imaging: No results found.   Medications:   . sodium chloride Stopped (06/16/20 0352)   . amLODipine  5 mg Oral Daily  . Chlorhexidine Gluconate Cloth  6 each Topical Q0600  . Chlorhexidine Gluconate Cloth  6 each Topical Q0600  . ferrous sulfate  325 mg Oral QODAY  . heparin  5,000 Units Subcutaneous Q8H  . metoCLOPramide (REGLAN) injection  5 mg Intravenous Q8H  . mycophenolate  500 mg Oral BID  . pantoprazole  40 mg Oral QHS  . sevelamer carbonate  800 mg Oral TID WC  . sodium bicarbonate  650 mg Oral TID  . sodium chloride flush  10 mL Intravenous Q12H  . sodium chloride  1 g Oral BID WC   sodium chloride, acetaminophen, docusate sodium, LORazepam, ondansetron (ZOFRAN) IV, ondansetron (ZOFRAN) IV, polyethylene glycol  Assessment/ Plan:  Carol Hicks is a 55 y.o.  female with hypertension, lupus nephritis, chronic kidney disease stage V   admitted on 06/09/2020 for Hyponatremia ,Seizure ([place]) [R56.9],  Acute renal failure, unspecified acute renal failure type ([place]) [N17.9]  1. 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  - Extra treatment today for volume removal.  -  Discontinue sodium bicarbonate.   2. Hyponatremia: secondary to kidney failure and volume overload.  - improved to 134 with hemodialysis.  - Discontinue sodium chloride.   3. Hypertension: elevated 167/82 - not currently on any antihypertensives.   4. Hyperparathyroidism due to secondary hyperparathyroidism  - renvela with meals   5. Anemia of CKD : hemoglobin 7 - holding off on blood transfusions.  - ESA as outpatient.    LOS: [place] 4/18/20221:53 PM

## 2020-06-22 NOTE — Progress Notes (Signed)
PT Cancellation Note  Patient Details Name: Carol Hicks MRN: [id number] DOB: 28-Dec-1965   Cancelled Treatment:    Reason Eval/Treat Not Completed: Other (comment).  Pt currently at dialysis (not available for PT session).  Will re-attempt PT treatment session at a later date/time as able.  Leitha Bleak, PT 06/22/20, 10:29 AM

## 2020-06-22 NOTE — Plan of Care (Signed)
Discharge order received. Patient mental status is at baseline. Vital signs stable . No signs of acute distress. Discharge instructions given. Patient verbalized understanding. No other issues noted at this time.   

## 2020-06-22 NOTE — Discharge Summary (Signed)
[facility] [id number] DOB: [birth year] DOA: 06/09/2020  PCP: Tracie Harrier, MD  Admit date: 06/09/2020 Discharge date: 06/22/2020  Admitted From: home Disposition:  home  Recommendations for Outpatient Follow-up:  1. Follow up with PCP in 1 week 2. Please obtain BMP/CBC in one week [facility], start on Tuesday 4/19 11:45am.  Home Health:yes    Discharge Condition:Stable CODE STATUS:Full  Diet recommendation: Renal diet  Brief/Interim Summary: Per [id number] is a 55 yo female who presented to [facility] on 04/5 from home with seizure activity. 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 There is concern pt may be close to ESRD and dialysis and/or kidney transplant. During a previous hospitalization she was started on bumex 2 mg bid and sodium bicarb. She was recently diagnosed with a viral URI with recommendations of supportive care and close outpatient follow-up on 03/25. Patients daughter reported the pt has not been "acting the same" for about 2 weeks. She also reported the patient went from a lying position to a seated position she had a seizure today that lasted for about 2-3 minutes. During the seizure the patient eyes rolled back and both her arms were shaking. Following the seizure the patient appeared very sleepy for about 5-10 minutes. EMS reported the pts vital signs were stable when they arrived.  ED Course Upon arrival to the ER patient's daughter had reported the patient appeared at her baseline, but the patient had no recollection of what happened prior to arrival in the ER. Pt reported she has had emesis when taking her sodium bicarb tablet and loose stools over the past 24-36 hrs. She also reported insomnia, on average only able to sleep 1-2 hrs per night over the past 2 weeks. Lab results revealed Na+ 105,  chloride 69, CO2 18, glucose 150, BUN 111, creatinine 6.24, calcium 6.9, anion gap 18, albumin 2.3, troponin 20, wbc 12.7, and hgb 8.8, platelets 440. CT Head negative for acute intracranial findings or mass lesions. She received 500 ml LR bolus. ER physician contacted on call nephrologist Dr. Candiss Norse and he recommended the following: 3% saline 50 ml bolus x1 to raise Na+ above 110 with slow correction thereafter (goal of ~8 Meq correction in 24hrs). Received 100 ml bolus of hypertonic saline per ER physician. Patient initially was admitted to ICU with hyponatremia Na 105 received 3% saline 50 ml bolus . Since patient was found with increasing WBC 22.9, CXR with left basilar opacity concerning for atelectasis vs. PNA, placed on empiric Azithromycin + Rocephin, cultures obtained. Strep pneumo urinary antigen negative.Legionella urinary antigen was negative. Patient noted to have significant anemia, consent obtained for transfusion.  Once her sodium level improved with also addition of po salt tablets, patient was transferred to med-surg floor. She had HD catheter placed by vascular surgery on 4/13, and she was started on dialysis.     Discharge Diagnoses:  Active Problems:   Hyponatremia  Severe hyponatremia and hypochloremia likely diuretic induced Anion gap metabolic acidosis  Seizure due to hyponatremia Likely due to excessive diuresis Seizure precautions as pt presented with seizure.   CT head negative for acute issues Nephrology following Received 500 cc LR in ED and 3% hypertonic saline small bolus and infusion off and on Started on sodium bicarb and dialysis On discharge nephrology did not want patient to continue on sodium supplements.  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 EM with membranous pattern and extraglomerular immune deposits as well as some inflammation and fibrosis in the interstitium by EM. Favoring autoimmune process." Was on Cell cept, plaquenil and prednisone as outpatient ..>>per nephrology discussion today it should be continued. Patient is now believed to be end-stage renal disease 4/13-  HD permacath  by vascular  Albumin given in HD today , further management per nephrology Per HD liaison patient clinically and financially accepted at [facility] 12:20.  Patient can start on Tuesday 4/19 at 11:45 AM      Leukocytosis Concern for Left Basilar Pneumonia on CXR 06/11/20 Leukocytosis improved Afebrile Blood cultures negative completed abx course with rocephin and azithrom. 5 days     Hyperglycemia  -BG levels stable  Follow up with pcp for further management   Murmur-echo with mild to moderate TR  Discharge Instructions  Discharge Instructions    Amb Referral to Nutrition and Diabetic Education   Complete by: As directed    Newly diagnosed CKD 5 and lupus. May require HD or kidney transplant soon. Pt is spanish speaking. Family requires a lot of reassurance. Thanks!   Call MD for:  difficulty breathing, headache or visual disturbances   Complete by: As directed    Call MD for:  temperature >100.4   Complete by: As directed    Diet - low sodium heart healthy   Complete by: As directed    Discharge instructions   Complete by: As directed    Follow renal diet. Avoid high potassium foods F/u  with dialysis tomorrow   Increase activity slowly   Complete by: As directed    No wound care   Complete by: As directed      Allergies as of 06/22/2020   No Known Allergies     Medication List    STOP taking these medications   bumetanide 2 MG tablet Commonly known as: BUMEX   losartan 50 MG tablet Commonly known as: COZAAR   sodium bicarbonate 650 MG tablet     TAKE these medications   amLODipine 10 MG tablet Commonly known as: NORVASC Take 1 tablet by mouth daily.   ergocalciferol 1.25 MG (50000 UT) capsule Commonly known as: VITAMIN D2 Take 1 capsule by mouth every 7 (seven) days. Takes on Mondays   hydroxychloroquine 200 MG tablet Commonly known as: PLAQUENIL Take 200 mg by mouth daily.   Iron 325 (65 Fe) MG Tabs Take 1 tablet by mouth every other day.   mycophenolate 250 MG capsule Commonly known as: CELLCEPT Take 500 mg by mouth in the morning and at bedtime.   pantoprazole 40 MG tablet Commonly known as: PROTONIX Take 1 tablet (40 mg total) by mouth at bedtime.   predniSONE 10 MG tablet Commonly known as: DELTASONE Take 1 tablet (10 mg total) by mouth daily for 10 days. TAKE '10MG'$  BY  MOUTH DAILY FOR 10 DAYS.   sevelamer carbonate 800 MG tablet Commonly known as: RENVELA Take 1 tablet (800 mg total) by mouth 3 (three) times daily with meals. What changed: how much to take            Durable Medical Equipment  (From admission, onward)         Start     Ordered   06/18/20 1614  For home use only DME Bedside commode  Once       Question:  Patient needs a bedside commode to treat with the following condition  Answer:  Weakness   06/18/20 1613          Follow-up Information    [facility]   Specialty: Home Health Services Why: Ellos harn un seguimiento con usted para sus necesidades de Film/video editor. Contact information: [place] [address] [phone]        Tracie Harrier, MD Follow up in 1 week(s).   Specialty: Internal Medicine Contact information: [address] [place] [address] [phone]              No Known Allergies  Consultations:  PCCM, nephrology, vascular surgery   Procedures/Studies: CT Head Wo Contrast  Result Date: 06/09/2020 CLINICAL DATA:  Mental status change. EXAM: CT HEAD WITHOUT CONTRAST TECHNIQUE: Contiguous axial images were obtained from the base of the skull through the vertex without intravenous contrast. COMPARISON:  None. FINDINGS: Brain: The ventricles are in the midline without mass effect or shift. No extra-axial fluid collections are identified. The gray-white differentiation is maintained. No findings for acute intracranial process such as hemorrhage or infarction. No mass lesions. The brainstem and cerebellum are unremarkable. Are few tiny scattered parenchymal calcifications which could be due to remote inflammation or infection. Vascular: Scattered age advanced vascular calcifications. No aneurysm or hyperdense vessels. Skull: No skull fracture or bone lesions. Sinuses/Orbits: Chronic appearing right-sided maxillary sinusitis with a thickened sinus wall. There is also mild right-sided ethmoid sinus disease. 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 Other: No scalp lesions or scalp hematoma. IMPRESSION: 1. No acute intracranial findings or mass lesions. 2. Chronic appearing right-sided maxillary sinusitis. Electronically Signed   By: Marijo Sanes M.D.   On: 06/09/2020 18:40   PERIPHERAL VASCULAR CATHETERIZATION  Result Date: 06/17/2020 See op note  DG Chest Port 1 View  Result Date: 06/11/2020 CLINICAL DATA:  Leukocytosis, history of systemic lupus erythematosus EXAM: PORTABLE CHEST 1 VIEW COMPARISON:  None. FINDINGS: The right lung is clear and negative for focal airspace consolidation, pulmonary edema or suspicious  pulmonary nodule. Nonspecific left basilar airspace opacity resulting in obscuration of the left hemidiaphragm. No pleural effusion or pneumothorax. Cardiac and mediastinal contours are within normal limits. No acute fracture or lytic or blastic osseous lesions. The visualized upper abdominal bowel gas pattern is unremarkable. IMPRESSION: Nonspecific left basilar airspace opacity obscures the left hemidiaphragm. Differential considerations include atelectasis, layering pleural effusion and/or infiltrate. Recommend dedicated PA and lateral chest x-ray. Electronically Signed   By: Jacqulynn Cadet M.D.   On: 06/11/2020 09:28   ECHOCARDIOGRAM COMPLETE  Result Date: 06/18/2020    ECHOCARDIOGRAM REPORT   Patient Name:   KEMAURI RIVERON Date of Exam: 06/17/2020 Medical Rec #:  [id number]       Height:       62.0 in Accession #:  [id number]      Weight:       179.7 lb Date of Birth:  1965/05/07        BSA:          1.827 m Patient Age:    55 years        BP:           136/83 mmHg Patient Gender: F               HR:           80 bpm. Exam Location:  [facility] Procedure: 2D Echo, Cardiac Doppler and Color Doppler Indications:     R01.1 Murmur  History:         Patient has no prior history of Echocardiogram examinations.                  Lupus. Kidney disease.  Sonographer:     Wilford Sports Rodgers-Jones Referring Phys:  [id number] Nolberto Hanlon Diagnosing Phys: Ida Rogue MD IMPRESSIONS  1. Left ventricular ejection fraction, by estimation, is 60 to 65%. The left ventricle has normal function. The left ventricle has no regional wall motion abnormalities. There is mild to moderate left ventricular hypertrophy. Left ventricular diastolic parameters are consistent with Grade II diastolic dysfunction (pseudonormalization).  2. Right ventricular systolic function is normal. The right ventricular size is normal.  3. The mitral valve is normal in structure. Mild mitral valve regurgitation.  4. Tricuspid valve regurgitation is mild to  moderate. FINDINGS  Left Ventricle: Left ventricular ejection fraction, by estimation, is 60 to 65%. The left ventricle has normal function. The left ventricle has no regional wall motion abnormalities. The left ventricular internal cavity size was normal in size. There is  mild to moderate left ventricular hypertrophy. Left ventricular diastolic parameters are consistent with Grade II diastolic dysfunction (pseudonormalization). Right Ventricle: The right ventricular size is normal. No increase in right ventricular wall thickness. Right ventricular systolic function is normal. Left Atrium: Left atrial size was normal in size. Right Atrium: Right atrial size was normal in size. Pericardium: There is no evidence of pericardial effusion. Mitral Valve: The mitral valve is normal in structure. Mild mitral valve regurgitation. No evidence of mitral valve stenosis. Tricuspid Valve: The tricuspid valve is normal in structure. Tricuspid valve regurgitation is mild to moderate. No evidence of tricuspid stenosis. Aortic Valve: The aortic valve is normal in structure. Aortic valve regurgitation is not visualized. No aortic stenosis is present. Pulmonic Valve: The pulmonic valve was normal in structure. Pulmonic valve regurgitation is not visualized. No evidence of pulmonic stenosis. Aorta: The aortic root is normal in size and structure. Venous: The inferior vena cava is normal in size with greater than 50% respiratory variability, suggesting right atrial pressure of 3 mmHg. IAS/Shunts: No atrial level shunt detected by color flow Doppler.  LEFT VENTRICLE PLAX 2D LVIDd:         4.66 cm  Diastology LVIDs:         2.56 cm  LV e' medial:    6.74 cm/s LV PW:         1.18 cm  LV E/e' medial:  15.1 LV IVS:        0.98 cm  LV e' lateral:   10.80 cm/s LVOT diam:     1.90 cm  LV E/e' lateral: 9.4 LV SV:         73 LV SV Index:   40 LVOT Area:     2.84 cm  RIGHT VENTRICLE             IVC RV Basal diam:  3.85 cm     IVC diam: 1.66 cm RV  S prime:     18.50 cm/s TAPSE (M-mode): 2.3 cm LEFT ATRIUM             Index       RIGHT ATRIUM           Index LA diam:        3.10 cm 1.70 cm/m  RA Area:     13.10 cm LA Vol (A2C):   33.3 ml 18.23 ml/m RA Volume:   32.50 ml  17.79 ml/m LA Vol (A4C):   35.3 ml 19.33 ml/m LA Biplane Vol: 37.2 ml 20.37 ml/m  AORTIC VALVE LVOT Vmax:   127.00 cm/s LVOT Vmean:  90.900 cm/s LVOT VTI:    0.258 m  AORTA Ao Root diam: 3.00 cm Ao Asc diam:  3.60 cm MITRAL VALVE                TRICUSPID VALVE MV Area (PHT): 3.31 cm     TR Peak grad:   25.8 mmHg MV Decel Time: 229 msec     TR Vmax:        254.00 cm/s MV E velocity: 102.00 cm/s MV A velocity: 95.40 cm/s   SHUNTS MV E/A ratio:  1.07         Systemic VTI:  0.26 m                             Systemic Diam: 1.90 cm Ida Rogue MD Electronically signed by Ida Rogue MD Signature Date/Time: 06/18/2020/10:18:41 AM    Final        Subjective: In dialysis this AM.  No complaints of shortness of breath, dizziness, chest pain or abdominal pain  Discharge Exam: Vitals:   06/22/20 1215 06/22/20 1230  BP: (!) 155/78 (!) 167/82  Pulse: 69 72  Resp: 14 15  Temp:    SpO2:     Vitals:   06/22/20 1145 06/22/20 1200 06/22/20 1215 06/22/20 1230  BP: (!) 157/79 (!) 159/90 (!) 155/78 (!) 167/82  Pulse: 73 73 69 72  Resp: 11 (!) '9 14 15  '$ Temp:      TempSrc:      SpO2:      Weight:      Height:        General: Pt is alert, awake, not in acute distress Cardiovascular: RRR, S1/S2 +, no rubs, no gallops Respiratory: CTA bilaterally, no wheezing, no rhonchi Abdominal: Soft, NT, ND, bowel sounds + Extremities: Generalized edema x4    The results of significant diagnostics from this hospitalization (including imaging, microbiology, ancillary and laboratory) are listed below for reference.     Microbiology: No results found for this or any previous visit (from the past 240 hour(s)).   Labs: BNP (last 3 results) No results for input(s): BNP in the last  8760 hours. Basic Metabolic Panel: Recent Labs  Lab 06/16/20 0250 06/16/20 0720 06/17/20 0249 06/17/20 0717 06/18/20 0415 06/18/20 [id number] 06/18/20 2249 06/19/20 0252 06/19/20 1106 06/19/20 1505 06/20/20 0418 06/21/20 0640 06/22/20 0409  NA 126*   < > 127*   < > 132*   < > 132* 132* 135 135 134*  --   --   K 3.9  --  3.9  --  3.8  --   --   --   --   --  3.3* 3.3* 3.3*  CL 96*  --  96*  --  98  --   --   --   --   --  99  --   --   CO2 16*  --  16*  --  20*  --   --   --   --   --  26  --   --   GLUCOSE 99  --  90  --  79  --   --   --   --   --  93  --   --   BUN 122*  --  123*  --  87*  --   --   --   --   --  30*  --   --   CREATININE 5.46*  --  5.49*  --  4.23*  --   --   --   --   --  2.56*  --   --   CALCIUM 6.4*  --  6.8*  --  7.2*  --   --   --   --   --  6.9*  --   --    < > = values in this interval not displayed.   Liver Function Tests: Recent Labs  Lab 06/21/20 1059  ALBUMIN 1.8*   No results for input(s): LIPASE, AMYLASE in the last 168 hours. No results for input(s): AMMONIA in the last 168 hours. CBC: Recent Labs  Lab 06/17/20 0249 06/18/20 0415 06/19/20 0252 06/20/20 0418 06/21/20 0640  WBC 5.4 4.7 4.5 3.8* 4.1  HGB 7.4* 7.8* 7.2* 6.8* 7.0*  HCT 21.9* 22.6* 21.7* 20.1* 21.2*  MCV 82.6 81.3 83.5 82.7 85.1  PLT 267 255 200 232 171   Cardiac Enzymes: No results for input(s): CKTOTAL, CKMB, CKMBINDEX, TROPONINI in the last 168 hours. BNP: Invalid input(s): POCBNP CBG: Recent Labs  Lab 06/15/20 2101 06/20/20 2056 06/20/20 2341  GLUCAP 120* 89 100*   D-Dimer No results for input(s): DDIMER in the last 72 hours. Hgb A1c No results for input(s): HGBA1C in the last 72 hours. Lipid Profile No results for input(s): CHOL, HDL, LDLCALC, TRIG, CHOLHDL, LDLDIRECT in the last 72 hours. Thyroid function studies No results for input(s): TSH, T4TOTAL, T3FREE, THYROIDAB in the last 72 hours.  Invalid input(s): FREET3 Anemia work up No results for  input(s): VITAMINB12, FOLATE, FERRITIN, TIBC, IRON, RETICCTPCT in the last 72 hours. Urinalysis    Component Value Date/Time   COLORURINE YELLOW (A) 06/10/2020 0606   APPEARANCEUR HAZY (A) 06/10/2020 0606   LABSPEC 1.011 06/10/2020 0606   PHURINE 5.0 06/10/2020 0606   GLUCOSEU 50 (A) 06/10/2020 0606   HGBUR MODERATE (A) 06/10/2020 0606   BILIRUBINUR NEGATIVE 06/10/2020 0606   KETONESUR NEGATIVE 06/10/2020 0606   PROTEINUR >=300 (A) 06/10/2020 0606   NITRITE NEGATIVE 06/10/2020 0606   LEUKOCYTESUR NEGATIVE 06/10/2020 0606   Sepsis Labs Invalid input(s): PROCALCITONIN,  WBC,  LACTICIDVEN Microbiology No results found for this or any previous visit (from the past 240 hour(s)).   Time coordinating discharge: Over 30 minutes  SIGNED:   Nolberto Hanlon, MD  [facility] 06/22/2020, 12:48 PM Pager   If 7PM-7AM, please contact night-coverage [email or web address] Password [email or web address]

## 2020-06-22 NOTE — TOC Transition Note (Signed)
Transition of Care [facility]) - CM/SW Discharge Note   Patient Details  Name: [facility] MRN: [id number] Date of Birth: 06/18/1965  [facility] CM/SW Contact:  Candie Chroman, LCSW Phone Number: 06/22/2020, 12:52 PM   Clinical Narrative:  Patient has orders to discharge home today. Alvis Lemmings is aware. HH orders are in. No further concerns. CSW signing off.   Final next level of care: [place] Barriers to Discharge: Barriers Resolved   Patient Goals and CMS Choice        Discharge Placement                    Patient and family notified of of transfer: 06/22/20  Discharge Plan and Services     Post Acute Care Choice: [place]          DME Arranged: 3-N-1 DME Agency: [employer] Date DME Agency Contacted: 06/18/20   Representative spoke with at DME Agency: [place]: PT,OT,Nurse's Aide [place]: Leslie Date [place]: 06/22/20   Representative spoke with at [place]: Price Determinants of Health ([place]) Interventions     Readmission Risk Interventions No flowsheet data found.

## 2020-06-23 DIAGNOSIS — G4089 Other seizures: Secondary | ICD-10-CM | POA: Insufficient documentation

## 2020-06-23 DIAGNOSIS — N186 End stage renal disease: Secondary | ICD-10-CM

## 2020-06-28 DIAGNOSIS — D631 Anemia in chronic kidney disease: Secondary | ICD-10-CM | POA: Insufficient documentation

## 2020-09-07 ENCOUNTER — Emergency Department: Payer: BC Managed Care – PPO

## 2020-09-07 ENCOUNTER — Emergency Department
Admission: EM | Admit: 2020-09-07 | Discharge: 2020-09-07 | Disposition: A | Discharge status: Home / Self Care | Payer: BC Managed Care – PPO | Attending: Emergency Medicine | Admitting: Emergency Medicine

## 2020-09-07 ENCOUNTER — Other Ambulatory Visit: Payer: Self-pay

## 2020-09-07 ENCOUNTER — Encounter: Payer: Self-pay | Admitting: Emergency Medicine

## 2020-09-07 DIAGNOSIS — T85398A Other mechanical complication of other ocular prosthetic devices, implants and grafts, initial encounter: Secondary | ICD-10-CM | POA: Insufficient documentation

## 2020-09-07 DIAGNOSIS — Y773 Surgical instruments, materials and ophthalmic devices (including sutures) associated with adverse incidents: Secondary | ICD-10-CM | POA: Insufficient documentation

## 2020-09-07 DIAGNOSIS — T859XXA Unspecified complication of internal prosthetic device, implant and graft, initial encounter: Secondary | ICD-10-CM

## 2020-09-07 DIAGNOSIS — Z79899 Other long term (current) drug therapy: Secondary | ICD-10-CM | POA: Diagnosis not present

## 2020-09-07 DIAGNOSIS — H5712 Ocular pain, left eye: Secondary | ICD-10-CM | POA: Diagnosis present

## 2020-09-07 MED ORDER — POLYMYXIN B-TRIMETHOPRIM 10000-0.1 UNIT/ML-% OP SOLN: 1.0000 [drp] | OPHTHALMIC | 0 refills | Status: AC

## 2020-09-07 NOTE — ED Provider Notes (Signed)
[facility] Emergency Department Provider Note  ____________________________________________  Time seen: Approximately 12:10 PM  I have reviewed the triage vital signs and the nursing notes.   HISTORY  Chief Complaint Eye Pain  Encounter completed with Spanish video interpreter at bedside. Additional history obtained from family member at bedside  HPI Carol Hicks is a 55 y.o. female with a past history of lupus and left eye prosthesis who comes the ED complaining of pain and swelling of the left orbit over the past 24 hours.  She was in her usual state of health when she fell asleep on the couch.  While leaning over, she felt the eye prosthesis rotate in her orbit and poke her soft tissues.  Denies bleeding or headache.  No fever.  Pain is mild to moderate, constant, no aggravating or alleviating factors.   prosthesis is intact    Past Medical History:  Diagnosis Date   Kidney disease    Lupus ([place])    Prosthetic eye globe      Patient Active Problem List   Diagnosis Date Noted   Hyponatremia 06/09/2020     Past Surgical History:  Procedure Laterality Date   DIALYSIS/PERMA CATHETER INSERTION N/A 06/17/2020   Procedure: DIALYSIS/PERMA CATHETER INSERTION;  Surgeon: Algernon Huxley, MD;  Location: [place] [facility];  Service: Cardiovascular;  Laterality: N/A;     Prior to Admission medications   Medication Sig Start Date End Date Taking? Authorizing Provider  trimethoprim-polymyxin b (POLYTRIM) ophthalmic solution Place 1 drop into the left eye every 4 (four) hours for 5 days. 09/07/20 09/12/20 Yes Carrie Mew, MD  amLODipine (NORVASC) 10 MG tablet Take 1 tablet by mouth daily. 05/14/20   [provider]  ergocalciferol (VITAMIN D2) 1.25 MG (50000 UT) capsule Take 1 capsule by mouth every 7 (seven) days. Takes on Mondays 05/18/20   [provider]  Ferrous Sulfate (IRON) 325 (65 Fe) MG TABS Take 1 tablet by mouth every other  day. 05/14/20   [provider]  hydroxychloroquine (PLAQUENIL) 200 MG tablet Take 200 mg by mouth daily. 06/05/20   [provider]  mycophenolate (CELLCEPT) 250 MG capsule Take 500 mg by mouth in the morning and at bedtime. 05/22/20 05/22/21  [provider]  pantoprazole (PROTONIX) 40 MG tablet Take 1 tablet (40 mg total) by mouth at bedtime. 06/22/20 07/22/20  Nolberto Hanlon, MD     Allergies Patient has no known allergies.   History reviewed. No pertinent family history.  Social History Social History   Tobacco Use   Smoking status: Never   Smokeless tobacco: Never  Vaping Use   Vaping Use: Never used  Substance Use Topics   Alcohol use: Not Currently   Drug use: Never    Review of Systems  Constitutional:   No fever or chills.  ENT:   No sore throat. No rhinorrhea. Cardiovascular:   No chest pain or syncope. Respiratory:   No dyspnea or cough. Musculoskeletal:   Negative for focal pain or swelling All other systems reviewed and are negative except as documented above in ROS and HPI.  ____________________________________________   PHYSICAL EXAM:  VITAL SIGNS: ED Triage Vitals  Enc Vitals Group     BP 09/07/20 1102 (!) 162/84     Pulse Rate 09/07/20 1102 75     Resp 09/07/20 1102 20     Temp 09/07/20 1102 98.2 F (36.8 C)     Temp Source 09/07/20 1102 Oral  SpO2 09/07/20 1102 100 %     Weight 09/07/20 1103 130 lb (59 kg)     Height 09/07/20 1103 '5\' 1"'$  (1.549 m)     Head Circumference --      Peak Flow --      Pain Score 09/07/20 1102 5     Pain Loc --      Pain Edu? --      Excl. in [place]? --     Vital signs reviewed, nursing assessments reviewed.   Constitutional:   Alert and oriented. Non-toxic appearance. Eyes:   Watery edema of left upper and lower eyelids with tenderness along the supraorbital ridge.  No erythema or induration.  No drainage.  No bleeding.Marland Kitchen ENT      Head:   Normocephalic and atraumatic.      Nose:    Normal.      Mouth/Throat:   Normal, moist mucosa.      Neck:   No meningismus. Full ROM.  Nontender Hematological/Lymphatic/Immunilogical:   No cervical lymphadenopathy. Cardiovascular:   RRR.  Cap refill less than 2 seconds. Respiratory:   Normal respiratory effort without tachypnea/retractions. Musculoskeletal:   Normal range of motion in all extremities. No joint effusions.  No lower extremity tenderness.  No edema. Neurologic:   Normal speech and language.  Motor grossly intact. No acute focal neurologic deficits are appreciated.  Skin:    Skin is warm, dry and intact. No rash noted.  No petechiae, purpura, or bullae.  ____________________________________________    LABS (pertinent positives/negatives) (all labs ordered are listed, but only abnormal results are displayed) Labs Reviewed - No data to display ____________________________________________   EKG    ____________________________________________    RADIOLOGY  CT Orbits Wo Contrast  Result Date: 09/07/2020 CLINICAL DATA:  Left facial pain after injury. EXAM: CT ORBITS WITHOUT CONTRAST TECHNIQUE: Multidetector CT imaging of the orbits was performed using the standard protocol without intravenous contrast. Multiplanar CT image reconstructions were also generated. COMPARISON:  June 09, 2020. FINDINGS: Orbits: Left eye prosthesis is again noted. Soft tissue thickening is seen involving the preseptal soft tissues on the left suggesting possible contusion. Right orbit is unremarkable. Visible paranasal sinuses: Clear. Soft tissues: As noted above, left periorbital contusion is noted. Osseous: No fracture or aggressive lesion. Limited intracranial: No acute or significant finding. IMPRESSION: Left periorbital contusion is noted. Left eye prosthesis is again noted. No other abnormality seen. Electronically Signed   By: Marijo Conception M.D.   On: 09/07/2020 12:34     ____________________________________________   PROCEDURES Procedures  ____________________________________________  DIFFERENTIAL DIAGNOSIS   Orbital hematoma, orbital fracture  CLINICAL IMPRESSION / ASSESSMENT AND PLAN / ED COURSE  Medications ordered in the ED: Medications - No data to display  Pertinent labs & imaging results that were available during my care of the patient were reviewed by me and considered in my medical decision making (see chart for details).  Carol Hicks was evaluated in Emergency Department on 09/07/2020 for the symptoms described in the history of present illness. She was evaluated in the context of the global COVID-19 pandemic, which necessitated consideration that the patient might be at risk for infection with the SARS-CoV-2 virus that causes COVID-19. Institutional protocols and algorithms that pertain to the evaluation of patients at risk for COVID-19 are in a state of rapid change based on information released by regulatory bodies including the CDC and federal and state organizations. These policies and algorithms were followed during the patient's care in  the ED.   Patient presents after minor left orbit trauma after a mishap with eye prosthesis.  Will obtain orbital CT and plan for ophthalmic antibiotic.  She will keep the prosthesis out for a few days to allow swelling to go down.  Plan to follow-up with her ophthalmologist  ----------------------------------------- 2:10 PM on 09/07/2020 ----------------------------------------- CT unremarkable except for some swelling suggestive of contusion.  Stable for discharge.  Will prescribe Polytrim      ____________________________________________   FINAL CLINICAL IMPRESSION(S) / ED DIAGNOSES    Final diagnoses:  Complication of prosthetic eye, initial encounter     ED Discharge Orders          Ordered    trimethoprim-polymyxin b (POLYTRIM) ophthalmic solution  Every 4 hours         09/07/20 1409            Portions of this note were generated with dragon dictation software. Dictation errors may occur despite best attempts at proofreading.   Carrie Mew, MD 09/07/20 1410

## 2020-09-07 NOTE — ED Triage Notes (Addendum)
Pt via POV from home. Pt is accompanied by daughter. Pt has a prosthetic L eye, pt on Thursday was falling asleep and fell forward and hit her L eye. Pt eye is swollen shut. L eye is painful and swollen which family states has gradually gotten worse. Pt is A&Ox4 and NAD.   Interpreter needed during triage

## 2020-09-07 NOTE — ED Notes (Signed)
See triage note  States she fell asleep  Hitting head/face  Left eye swollen shut

## 2020-09-07 NOTE — Discharge Instructions (Addendum)
Your CT scan shows contusion of the eye socket.  There is no bleeding or fracture.

## 2020-11-20 ENCOUNTER — Other Ambulatory Visit: Payer: Self-pay | Admitting: Obstetrics and Gynecology

## 2020-11-20 DIAGNOSIS — N6311 Unspecified lump in the right breast, upper outer quadrant: Secondary | ICD-10-CM

## 2020-11-27 ENCOUNTER — Other Ambulatory Visit: Payer: Self-pay

## 2020-11-27 ENCOUNTER — Ambulatory Visit
Admission: RE | Admit: 2020-11-27 | Discharge: 2020-11-27 | Disposition: A | Discharge status: Home / Self Care | Payer: BC Managed Care – PPO | Source: Ambulatory Visit | Attending: Obstetrics and Gynecology | Admitting: Obstetrics and Gynecology

## 2020-11-27 DIAGNOSIS — N6311 Unspecified lump in the right breast, upper outer quadrant: Secondary | ICD-10-CM | POA: Diagnosis present

## 2021-03-17 ENCOUNTER — Emergency Department: Payer: BC Managed Care – PPO

## 2021-03-17 ENCOUNTER — Inpatient Hospital Stay
Admission: EM | Admit: 2021-03-17 | Discharge: 2021-03-19 | DRG: 193 | Disposition: A | Discharge status: Home / Self Care | Payer: BC Managed Care – PPO | Attending: Internal Medicine | Admitting: Internal Medicine

## 2021-03-17 ENCOUNTER — Other Ambulatory Visit: Payer: Self-pay

## 2021-03-17 DIAGNOSIS — N186 End stage renal disease: Secondary | ICD-10-CM | POA: Diagnosis not present

## 2021-03-17 DIAGNOSIS — R54 Age-related physical debility: Secondary | ICD-10-CM | POA: Diagnosis present

## 2021-03-17 DIAGNOSIS — Z79899 Other long term (current) drug therapy: Secondary | ICD-10-CM

## 2021-03-17 DIAGNOSIS — Z992 Dependence on renal dialysis: Secondary | ICD-10-CM

## 2021-03-17 DIAGNOSIS — Z8616 Personal history of COVID-19: Secondary | ICD-10-CM

## 2021-03-17 DIAGNOSIS — M3214 Glomerular disease in systemic lupus erythematosus: Secondary | ICD-10-CM

## 2021-03-17 DIAGNOSIS — N2581 Secondary hyperparathyroidism of renal origin: Secondary | ICD-10-CM | POA: Diagnosis present

## 2021-03-17 DIAGNOSIS — E871 Hypo-osmolality and hyponatremia: Secondary | ICD-10-CM | POA: Diagnosis not present

## 2021-03-17 DIAGNOSIS — IMO0002 Reserved for concepts with insufficient information to code with codable children: Secondary | ICD-10-CM

## 2021-03-17 DIAGNOSIS — Z28310 Unvaccinated for covid-19: Secondary | ICD-10-CM

## 2021-03-17 DIAGNOSIS — Z79624 Long term (current) use of inhibitors of nucleotide synthesis: Secondary | ICD-10-CM

## 2021-03-17 DIAGNOSIS — J189 Pneumonia, unspecified organism: Secondary | ICD-10-CM | POA: Diagnosis not present

## 2021-03-17 DIAGNOSIS — M329 Systemic lupus erythematosus, unspecified: Secondary | ICD-10-CM | POA: Diagnosis not present

## 2021-03-17 DIAGNOSIS — A419 Sepsis, unspecified organism: Secondary | ICD-10-CM

## 2021-03-17 DIAGNOSIS — I120 Hypertensive chronic kidney disease with stage 5 chronic kidney disease or end stage renal disease: Secondary | ICD-10-CM | POA: Diagnosis present

## 2021-03-17 DIAGNOSIS — Z20822 Contact with and (suspected) exposure to covid-19: Secondary | ICD-10-CM | POA: Diagnosis present

## 2021-03-17 DIAGNOSIS — Z970 Presence of artificial eye: Secondary | ICD-10-CM

## 2021-03-17 DIAGNOSIS — Z681 Body mass index (BMI) 19 or less, adult: Secondary | ICD-10-CM

## 2021-03-17 DIAGNOSIS — D631 Anemia in chronic kidney disease: Secondary | ICD-10-CM | POA: Diagnosis present

## 2021-03-17 LAB — BASIC METABOLIC PANEL
Anion gap: 15 (ref 5–15)
BUN: 40 mg/dL — ABNORMAL HIGH (ref 6–20)
CO2: 24 mmol/L (ref 22–32)
Calcium: 8.6 mg/dL — ABNORMAL LOW (ref 8.9–10.3)
Chloride: 90 mmol/L — ABNORMAL LOW (ref 98–111)
Creatinine, Ser: 4.79 mg/dL — ABNORMAL HIGH (ref 0.44–1.00)
GFR, Estimated: 10 mL/min — ABNORMAL LOW (ref >60)
Glucose, Bld: 109 mg/dL — ABNORMAL HIGH (ref 70–99)
Potassium: 3.4 mmol/L — ABNORMAL LOW (ref 3.5–5.1)
Sodium: 129 mmol/L — ABNORMAL LOW (ref 135–145)

## 2021-03-17 LAB — CBC
HCT: 35.7 % — ABNORMAL LOW (ref 36.0–46.0)
Hemoglobin: 11.2 g/dL — ABNORMAL LOW (ref 12.0–15.0)
MCH: 30.4 pg (ref 26.0–34.0)
MCHC: 31.4 g/dL (ref 30.0–36.0)
MCV: 96.7 fL (ref 80.0–100.0)
Platelets: 264 10*3/uL (ref 150–400)
RBC: 3.69 MIL/uL — ABNORMAL LOW (ref 3.87–5.11)
RDW: 13.1 % (ref 11.5–15.5)
WBC: 10.4 10*3/uL (ref 4.0–10.5)
nRBC: 0 % (ref 0.0–0.2)

## 2021-03-17 LAB — LACTIC ACID, PLASMA
Lactic Acid, Venous: 0.9 mmol/L (ref 0.5–1.9)
Lactic Acid, Venous: 1 mmol/L (ref 0.5–1.9)

## 2021-03-17 LAB — RESP PANEL BY RT-PCR (FLU A&B, COVID) ARPGX2
Influenza A by PCR: NEGATIVE
Influenza B by PCR: NEGATIVE
SARS Coronavirus 2 by RT PCR: NEGATIVE

## 2021-03-17 LAB — MAGNESIUM: Magnesium: 2 mg/dL (ref 1.7–2.4)

## 2021-03-17 LAB — PROCALCITONIN: Procalcitonin: 0.41 ng/mL

## 2021-03-17 LAB — PHOSPHORUS: Phosphorus: 2.5 mg/dL (ref 2.5–4.6)

## 2021-03-17 MED ORDER — SODIUM CHLORIDE 0.9 % IV SOLN: 500.0000 mg | INTRAVENOUS | Status: DC

## 2021-03-17 MED ORDER — SODIUM CHLORIDE 0.9 % IV SOLN
1.0000 g | Freq: Once | INTRAVENOUS | Status: AC
  Administered 2021-03-17: 1 g via INTRAVENOUS
  Filled 2021-03-17: qty 10

## 2021-03-17 MED ORDER — ONDANSETRON HCL 4 MG/2ML IJ SOLN: 4.0000 mg | Freq: Four times a day (QID) | INTRAMUSCULAR | Status: DC | PRN

## 2021-03-17 MED ORDER — ONDANSETRON HCL 4 MG PO TABS: 4.0000 mg | ORAL_TABLET | Freq: Four times a day (QID) | ORAL | Status: DC | PRN

## 2021-03-17 MED ORDER — ACETAMINOPHEN 325 MG PO TABS
650.0000 mg | ORAL_TABLET | Freq: Four times a day (QID) | ORAL | Status: DC | PRN
  Administered 2021-03-18: 650 mg via ORAL
  Filled 2021-03-17: qty 2

## 2021-03-17 MED ORDER — SODIUM CHLORIDE 0.9 % IV SOLN
500.0000 mg | Freq: Once | INTRAVENOUS | Status: AC
  Administered 2021-03-17: 500 mg via INTRAVENOUS
  Filled 2021-03-17: qty 5

## 2021-03-17 MED ORDER — ACETAMINOPHEN 650 MG RE SUPP: 650.0000 mg | Freq: Four times a day (QID) | RECTAL | Status: DC | PRN

## 2021-03-17 MED ORDER — LACTATED RINGERS IV BOLUS
1000.0000 mL | Freq: Once | INTRAVENOUS | Status: AC
  Administered 2021-03-17: 1000 mL via INTRAVENOUS

## 2021-03-17 MED ORDER — AMLODIPINE BESYLATE 10 MG PO TABS
10.0000 mg | ORAL_TABLET | Freq: Every day | ORAL | Status: DC
  Administered 2021-03-18: 10 mg via ORAL
  Filled 2021-03-17: qty 2
  Filled 2021-03-17 (×2): qty 1

## 2021-03-17 MED ORDER — SODIUM CHLORIDE 0.9 % IV SOLN
2.0000 g | INTRAVENOUS | Status: DC
  Administered 2021-03-18: 2 g via INTRAVENOUS
  Filled 2021-03-17: qty 2
  Filled 2021-03-17: qty 20

## 2021-03-17 MED ORDER — ACETAMINOPHEN 325 MG PO TABS
650.0000 mg | ORAL_TABLET | Freq: Once | ORAL | Status: AC
  Administered 2021-03-17: 650 mg via ORAL
  Filled 2021-03-17: qty 2

## 2021-03-17 MED ORDER — HEPARIN SODIUM (PORCINE) 5000 UNIT/ML IJ SOLN
5000.0000 [IU] | Freq: Three times a day (TID) | INTRAMUSCULAR | Status: DC
  Administered 2021-03-17 – 2021-03-19 (×5): 5000 [IU] via SUBCUTANEOUS
  Filled 2021-03-17 (×5): qty 1

## 2021-03-17 NOTE — ED Triage Notes (Signed)
Pt to ED with daughter for continued shob and fever, tested positive for covid 2 weeks ago Pt in NAD  Last dialysis yesterday.

## 2021-03-17 NOTE — H&P (Addendum)
History and Physical   Carol Hicks GHW:[id number] DOB: 03-May-1965 DOA: 03/17/2021  PCP: Tracie Harrier, MD  Outpatient Specialists: Dr. Sonda Rumble, Nephrology Patient coming from: home   I have personally briefly reviewed patient's old medical records in [place].  Chief Concern: cough   HPI: Carol Hicks is a 56 y.o. female with medical history significant for end-stage renal disease on hemodialysis, lupus nephritis, lupus, who presents emergency department for chief concerns of cough and congestion.  At bedside, she was able to tell me her name, her age, current location of hospital.  She reports that she has been having cough, congestion, and chest pain with cough. These symptoms have been Saturday. She endorses fever at home of 102. She took tylenol for the fever at home. She endorses productive cough that is green and that this started at the same time that she started coughing and having congestion.  Social history: She lives with her husband and children. She denies history of tobacco, etoh, recreational drugs. 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   Vaccination history: She is not vaccinated for covid 19.  ROS: Constitutional: no fever ENT/Mouth: no sore throat, no rhinorrhea Eyes: no eye pain, no vision changes Cardiovascular: no chest pain, no dyspnea,  no edema, no palpitations Respiratory: + cough, + sputum, no wheezing Gastrointestinal: no nausea, no vomiting, no diarrhea, no constipation Genitourinary: no urinary incontinence, no dysuria, no hematuria Musculoskeletal: no arthralgias, no myalgias Skin: no skin lesions, no pruritus, Neuro: + weakness, no loss of consciousness, no syncope Psych: no anxiety, no depression, + decrease appetite Heme/Lymph: no bruising, no bleeding  ED Course: Discussed with emergency medicine provider, patient requiring hospitalization for chief concerns of shortness of breath in setting of meeting sepsis criteria and  immunocompromise.  Vitals in the emergency department showed T-max of 100.4, improved to 99.3, respiratory rate initially was 22 and improved to 17, heart rate was initially 112 and improved to 105, blood pressure 124/59  Assessment/Plan  Principal Problem:   Community acquired pneumonia Active Problems:   Hyponatremia   Lupus ([place])   ESRD (end stage renal disease) ([place])   Lupus nephritis ([place])   Personal history of COVID-19   # Cough and congestion-presumed secondary to [facility] pneumonia in setting of recent COVID infection - Check MRSA PCR - Procalcitonin was checked and elevated - Continue with azithromycin and ceftriaxone to cover for 5 days - In setting of hyponatremia, I will check for Legionella as well  # Hyponatremia-check Legionella, urine osmolality, urine sodium  # End-stage renal disease on hemodialysis-epic order has been placed for nephrology consultation - Presumed secondary to Lupus nephritis - Dialysis dependent - A.m. team to reach out to nephrology for continuation of hemodialysis  # Lupus-resumed home mycophenolate 500 mg in the morning and at bedtime - Hydrochloric when 200 mg p.o. daily resumed  # Patient tested positive for COVID on 03/09/2021 - Patient is not vaccinated for COVID-19  Need med reconciliation  Chart reviewed.   DVT prophylaxis: Heparin 5000 units subcutaneous daily hours Code Status: Full code Diet: Renal Family Communication: Daughter was at bedside with patient's permission Disposition Plan: Pending clinical course Consults called: Nephrology via epic Admission status: Telemetry medical, observation  Past Medical History:  Diagnosis Date   Kidney disease    Lupus ([place])    Prosthetic eye globe    Past Surgical History:  Procedure Laterality Date   DIALYSIS/PERMA CATHETER INSERTION N/A 06/17/2020   Procedure: DIALYSIS/PERMA CATHETER INSERTION;  Surgeon: Algernon Huxley, MD;  Location: [place] [facility];  Service:  Cardiovascular;  Laterality: N/A;   Social History:  reports that she has never smoked. She has never used smokeless tobacco. She reports that she does not currently use alcohol. She reports that she does not use drugs.  No Known Allergies History reviewed. No pertinent family history. Family history: Family history reviewed and not pertinent  Prior to Admission medications   Medication Sig Start Date End Date Taking? Authorizing Provider  amLODipine (NORVASC) 10 MG tablet Take 1 tablet by mouth daily. 05/14/20   [provider]  ergocalciferol (VITAMIN D2) 1.25 MG (50000 UT) capsule Take 1 capsule by mouth every 7 (seven) days. Takes on Mondays 05/18/20   [provider]  Ferrous Sulfate (IRON) 325 (65 Fe) MG TABS Take 1 tablet by mouth every other day. 05/14/20   [provider]  hydroxychloroquine (PLAQUENIL) 200 MG tablet Take 200 mg by mouth daily. 06/05/20   [provider]  mycophenolate (CELLCEPT) 250 MG capsule Take 500 mg by mouth in the morning and at bedtime. 05/22/20 05/22/21  [provider]  pantoprazole (PROTONIX) 40 MG tablet Take 1 tablet (40 mg total) by mouth at bedtime. 06/22/20 07/22/20  Nolberto Hanlon, MD   Physical Exam: Vitals:   03/17/21 1858 03/17/21 2018 03/17/21 2251 03/18/21 0011  BP: 108/60 125/78 135/67 125/64  Pulse: (!) 106 (!) 105 (!) 113 (!) 105  Resp:  17 19 17   Temp: 99.3 F (37.4 C)  100.2 F (37.9 C)   TempSrc: Oral  Oral   SpO2: 98% 98% 96% 95%  Weight:      Height:       Constitutional: appears age-appropriate, frail, NAD, calm, comfortable Eyes: PERRL, lids and conjunctivae normal ENMT: Mucous membranes are moist. Posterior pharynx clear of any exudate or lesions. Age-appropriate dentition. Hearing appropriate Neck: normal, supple, no masses, no thyromegaly Respiratory: clear to auscultation bilaterally, no wheezing, no crackles. Normal respiratory effort. No accessory muscle use.  Cardiovascular: Regular  rate and rhythm, no murmurs / rubs / gallops. No extremity edema. 2+ pedal pulses. No carotid bruits.  Abdomen: no tenderness, no masses palpated, no hepatosplenomegaly. Bowel sounds positive.  Musculoskeletal: no clubbing / cyanosis. No joint deformity upper and lower extremities. Good ROM, no contractures, no atrophy. Normal muscle tone.  Skin: no rashes, lesions, ulcers. No induration Neurologic: Sensation intact. Strength 5/5 in all 4.  Psychiatric: Normal judgment and insight. Alert and oriented x 3. Normal mood.   EKG: independently reviewed, showing sinus tachycardia with rate of 110, QTc 424  Chest x-ray on Admission: I personally reviewed and I agree with radiologist reading as below.  DG Chest 2 View  Result Date: 03/17/2021 CLINICAL DATA:  Shortness of breath. Tested positive for COVID 2 weeks ago. EXAM: CHEST - 2 VIEW COMPARISON:  Radiographs 06/11/2020. FINDINGS: The heart size and mediastinal contours are stable without definite adenopathy. There is interval improved aeration of the retrocardiac left lower lobe with residual patchy airspace disease and blunting of the left costophrenic angle. There is also patchy airspace opacity projecting over the right upper lobe. No consolidation, large pleural effusion or pneumothorax. There are degenerative changes within the spine. IMPRESSION: Patchy right upper lobe and left basilar airspace opacities which could reflect pneumonia. Followup PA and lateral chest X-ray is recommended in 3-4 weeks following appropriate therapy to ensure resolution and exclude underlying malignancy. Electronically Signed   By: Richardean Sale M.D.   On: 03/17/2021  16:05    Labs on Admission: I have personally reviewed following labs  CBC: Recent Labs  Lab 03/17/21 1523  WBC 10.4  HGB 11.2*  HCT 35.7*  MCV 96.7  PLT 615   Basic Metabolic Panel: Recent Labs  Lab 03/17/21 1523 03/17/21 2133  NA 129*  --   K 3.4*  --   CL 90*  --   CO2 24  --    GLUCOSE 109*  --   BUN 40*  --   CREATININE 4.79*  --   CALCIUM 8.6*  --   MG  --  2.0  PHOS  --  2.5   GFR: Estimated Creatinine Clearance: 11.1 mL/min (A) (by C-G formula based on SCr of 4.79 mg/dL (H)).  Urine analysis:    Component Value Date/Time   COLORURINE YELLOW (A) 06/10/2020 0606   APPEARANCEUR HAZY (A) 06/10/2020 0606   LABSPEC 1.011 06/10/2020 0606   PHURINE 5.0 06/10/2020 0606   GLUCOSEU 50 (A) 06/10/2020 0606   HGBUR MODERATE (A) 06/10/2020 0606   BILIRUBINUR NEGATIVE 06/10/2020 0606   KETONESUR NEGATIVE 06/10/2020 0606   PROTEINUR >=300 (A) 06/10/2020 0606   NITRITE NEGATIVE 06/10/2020 0606   LEUKOCYTESUR NEGATIVE 06/10/2020 0606   Dr. Tobie Poet [facility]  If 7PM-7AM, please contact overnight-coverage provider If 7AM-7PM, please contact day coverage provider [email or web address]  03/18/2021, 12:26 AM

## 2021-03-17 NOTE — ED Provider Notes (Signed)
[facility] Provider Note    Event Date/Time   First MD Initiated Contact with Patient 03/17/21 2012     (approximate)   History   Chief Complaint Shortness of Breath   HPI  Carol Hicks is a 56 y.o. female with past medical history of hypertension and ESRD on HD (TTS) who presents to the ED complaining of shortness of breath.  History is limited as patient is Spanish-speaking only and history obtained via video interpreter [phone].  Patient reports that she has been dealing with a productive cough for almost 2 weeks.  She describes thick greenish sputum along with subjective fevers and chills.  She initially tested positive for COVID-19 on January 3 and was prescribed Paxlovid by her PCP.  She states she has continued to feel worse since then with increasing malaise and soreness in her chest when she coughs.  She reports feeling mildly short of breath, particularly with exertion.  She has not had any nausea, vomiting, or diarrhea.  She has continued to attend her usual dialysis treatments, last received a full run of dialysis yesterday.     Physical Exam   Triage Vital Signs: ED Triage Vitals  Enc Vitals Group     BP 03/17/21 1521 (!) 124/59     Pulse Rate 03/17/21 1521 (!) 112     Resp 03/17/21 1521 (!) 22     Temp 03/17/21 1521 (!) 100.4 F (38 C)     Temp Source 03/17/21 1521 Oral     SpO2 03/17/21 1521 98 %     Weight 03/17/21 1522 130 lb (59 kg)     Height 03/17/21 1522 5\' 1"  (1.549 m)     Head Circumference --      Peak Flow --      Pain Score 03/17/21 1522 0     Pain Loc --      Pain Edu? --      Excl. in [place]? --     Most recent vital signs: Vitals:   03/17/21 1858 03/17/21 2018  BP: 108/60 125/78  Pulse: (!) 106 (!) 105  Resp:  17  Temp: 99.3 F (37.4 C)   SpO2: 98% 98%    Constitutional: Alert and oriented. Eyes: Conjunctivae are normal. Head: Atraumatic. Nose: No congestion/rhinnorhea. Mouth/Throat: Mucous membranes are  moist.  Cardiovascular: Tachycardic, regular rhythm. Grossly normal heart sounds.  2+ radial pulses bilaterally.  Left upper extremity AV fistula with palpable thrill. Respiratory: Mildly tachypneic with normal respiratory effort.  No retractions. Lungs CTAB. Gastrointestinal: Soft and nontender. No distention. Genitourinary: Deferred. Musculoskeletal: No lower extremity tenderness nor edema.  Neurologic:  Normal speech and language. No gross focal neurologic deficits are appreciated.    ED Results / Procedures / Treatments   Labs (all labs ordered are listed, but only abnormal results are displayed) Labs Reviewed  CBC - Abnormal; Notable for the following components:      Result Value   RBC 3.69 (*)    Hemoglobin 11.2 (*)    HCT 35.7 (*)    All other components within normal limits  BASIC METABOLIC PANEL - Abnormal; Notable for the following components:   Sodium 129 (*)    Potassium 3.4 (*)    Chloride 90 (*)    Glucose, Bld 109 (*)    BUN 40 (*)    Creatinine, Ser 4.79 (*)    Calcium 8.6 (*)    GFR, Estimated 10 (*)    All other components within  normal limits  RESP PANEL BY RT-PCR (FLU A&B, COVID) ARPGX2  CULTURE, BLOOD (ROUTINE X 2)  CULTURE, BLOOD (ROUTINE X 2)  LACTIC ACID, PLASMA  LACTIC ACID, PLASMA  PROCALCITONIN     EKG  ED ECG REPORT I, Blake Divine, the attending physician, personally viewed and interpreted this ECG.   Date: 03/17/2021  EKG Time: 15:32  Rate: 110  Rhythm: sinus tachycardia  Axis: Normal  Intervals:none  ST&T Change: Inferior T wave inversions  RADIOLOGY Chest x-ray reviewed by me with right upper lobe and left lower lobe infiltrates concerning for pneumonia.  PROCEDURES:  Critical Care performed: Yes, see critical care procedure note(s)  .Critical Care Performed by: Blake Divine, MD Authorized by: Blake Divine, MD   Critical care provider statement:    Critical care time (minutes):  45   Critical care time was  exclusive of:  Separately billable procedures and treating other patients and teaching time   Critical care was necessary to treat or prevent imminent or life-threatening deterioration of the following conditions:  Sepsis   Critical care was time spent personally by me on the following activities:  Development of treatment plan with patient or surrogate, discussions with consultants, evaluation of patient's response to treatment, examination of patient, ordering and review of laboratory studies, ordering and review of radiographic studies, ordering and performing treatments and interventions, pulse oximetry, re-evaluation of patient's condition and review of old charts   I assumed direction of critical care for this patient from another provider in my specialty: no     Care discussed with: admitting provider     MEDICATIONS ORDERED IN ED: Medications  lactated ringers bolus 1,000 mL (has no administration in time range)  cefTRIAXone (ROCEPHIN) 1 g in sodium chloride 0.9 % 100 mL IVPB (has no administration in time range)  azithromycin (ZITHROMAX) 500 mg in sodium chloride 0.9 % 250 mL IVPB (has no administration in time range)  acetaminophen (TYLENOL) tablet 650 mg (650 mg Oral Given 03/17/21 1526)     IMPRESSION / MDM / [place] / ED COURSE  I reviewed the triage vital signs and the nursing notes.                              56 y.o. female with past medical history of hypertension and ESRD on HD (TTS) who presents to the ED with ongoing productive cough, subjective fevers, and soreness in her chest since testing positive for COVID-19 on January 3.  Differential diagnosis includes, but is not limited to, COVID-19, influenza, pneumonia, hypervolemia, sepsis.  Patient is in no acute distress, maintaining O2 sats on room air, however she meets sepsis criteria based off of her heart rate fever at the time of arrival.  Symptoms may be related to viral syndrome such as COVID-19 and Care  Everywhere confirms that she tested positive for COVID-19 on January 3.  However, her COVID-19 test is negative today and chest x-ray reviewed by me with multifocal infiltrates concerning for pneumonia.  Given her worsening symptoms, I am concerned for bacterial pneumonia and we will start on Rocephin and azithromycin.  Labs today are remarkable for mild hyponatremia and renal function stable compared to previous, CBC shows stable hemoglobin with no significant leukocytosis.  We will add on procalcitonin but given patient's immunocompromise status, case discussed with hospitalist for admission.       FINAL CLINICAL IMPRESSION(S) / ED DIAGNOSES   Final diagnoses:  Sepsis  without acute organ dysfunction, due to unspecified organism [facility])  Community acquired pneumonia, unspecified laterality  ESRD on dialysis [facility])     Rx / DC Orders   ED Discharge Orders     None        Note:  This document was prepared using Dragon voice recognition software and may include unintentional dictation errors.   Blake Divine, MD 03/17/21 2146

## 2021-03-17 NOTE — ED Provider Triage Note (Addendum)
°  Emergency Medicine Provider Triage Evaluation Note  Carol Hicks , a 55 y.o.female,  was evaluated in triage.  Pt complains of shortness of breath.  Patient states that she tested positive for COVID 2 weeks ago.  She was given Paxil over the pretreatment, however she has failed to improve.  She currently reports sinus congestion, chest pain, shortness of breath, and fevers.  Denies abdominal pain, urinary symptoms, flank pain, nausea/vomiting, or headaches.   Review of Systems  Positive: Chest pain, shortness of breath, fever Negative: Denies headaches, urinary symptoms, vomiting  Physical Exam   Vitals:   03/17/21 1521  BP: (!) 124/59  Pulse: (!) 112  Resp: (!) 22  Temp: (!) 100.4 F (38 C)  SpO2: 98%   Gen:   Awake, no distress   Resp:  Normal effort  MSK:   Moves extremities without difficulty  Other:  Audible cough and congestion.  Medical Decision Making  Given the patient's initial medical screening exam, the following diagnostic evaluation has been ordered. The patient will be placed in the appropriate treatment space, once one is available, to complete the evaluation and treatment. I have discussed the plan of care with the patient and I have advised the patient that an ED physician or mid-level practitioner will reevaluate their condition after the test results have been received, as the results may give them additional insight into the type of treatment they may need.    Diagnostics: Labs, EKG, CXR, respiratory panel  Treatments: Acetaminophen.   Teodoro Spray, [place] 03/17/21 [place], [place], [place] 03/17/21 1526

## 2021-03-18 ENCOUNTER — Encounter: Payer: Self-pay | Admitting: Internal Medicine

## 2021-03-18 DIAGNOSIS — N186 End stage renal disease: Secondary | ICD-10-CM | POA: Diagnosis present

## 2021-03-18 DIAGNOSIS — R54 Age-related physical debility: Secondary | ICD-10-CM | POA: Diagnosis present

## 2021-03-18 DIAGNOSIS — J189 Pneumonia, unspecified organism: Principal | ICD-10-CM | POA: Diagnosis present

## 2021-03-18 DIAGNOSIS — Z970 Presence of artificial eye: Secondary | ICD-10-CM | POA: Diagnosis not present

## 2021-03-18 DIAGNOSIS — Z992 Dependence on renal dialysis: Secondary | ICD-10-CM | POA: Diagnosis not present

## 2021-03-18 DIAGNOSIS — D631 Anemia in chronic kidney disease: Secondary | ICD-10-CM | POA: Diagnosis present

## 2021-03-18 DIAGNOSIS — N2581 Secondary hyperparathyroidism of renal origin: Secondary | ICD-10-CM | POA: Diagnosis present

## 2021-03-18 DIAGNOSIS — Z20822 Contact with and (suspected) exposure to covid-19: Secondary | ICD-10-CM | POA: Diagnosis present

## 2021-03-18 DIAGNOSIS — M329 Systemic lupus erythematosus, unspecified: Secondary | ICD-10-CM | POA: Diagnosis present

## 2021-03-18 DIAGNOSIS — Z681 Body mass index (BMI) 19 or less, adult: Secondary | ICD-10-CM | POA: Diagnosis not present

## 2021-03-18 DIAGNOSIS — I120 Hypertensive chronic kidney disease with stage 5 chronic kidney disease or end stage renal disease: Secondary | ICD-10-CM | POA: Diagnosis present

## 2021-03-18 DIAGNOSIS — E871 Hypo-osmolality and hyponatremia: Secondary | ICD-10-CM | POA: Diagnosis present

## 2021-03-18 DIAGNOSIS — Z28310 Unvaccinated for covid-19: Secondary | ICD-10-CM | POA: Diagnosis not present

## 2021-03-18 DIAGNOSIS — Z79899 Other long term (current) drug therapy: Secondary | ICD-10-CM | POA: Diagnosis not present

## 2021-03-18 DIAGNOSIS — Z8616 Personal history of COVID-19: Secondary | ICD-10-CM | POA: Diagnosis not present

## 2021-03-18 DIAGNOSIS — M3214 Glomerular disease in systemic lupus erythematosus: Secondary | ICD-10-CM | POA: Diagnosis present

## 2021-03-18 DIAGNOSIS — Z79624 Long term (current) use of inhibitors of nucleotide synthesis: Secondary | ICD-10-CM | POA: Diagnosis not present

## 2021-03-18 LAB — CBC
HCT: 32.5 % — ABNORMAL LOW (ref 36.0–46.0)
Hemoglobin: 10 g/dL — ABNORMAL LOW (ref 12.0–15.0)
MCH: 30 pg (ref 26.0–34.0)
MCHC: 30.8 g/dL (ref 30.0–36.0)
MCV: 97.6 fL (ref 80.0–100.0)
Platelets: 214 10*3/uL (ref 150–400)
RBC: 3.33 MIL/uL — ABNORMAL LOW (ref 3.87–5.11)
RDW: 13 % (ref 11.5–15.5)
WBC: 9.1 10*3/uL (ref 4.0–10.5)
nRBC: 0 % (ref 0.0–0.2)

## 2021-03-18 LAB — BASIC METABOLIC PANEL
Anion gap: 13 (ref 5–15)
BUN: 43 mg/dL — ABNORMAL HIGH (ref 6–20)
CO2: 22 mmol/L (ref 22–32)
Calcium: 8.1 mg/dL — ABNORMAL LOW (ref 8.9–10.3)
Chloride: 96 mmol/L — ABNORMAL LOW (ref 98–111)
Creatinine, Ser: 4.97 mg/dL — ABNORMAL HIGH (ref 0.44–1.00)
GFR, Estimated: 10 mL/min — ABNORMAL LOW (ref >60)
Glucose, Bld: 83 mg/dL (ref 70–99)
Potassium: 3.6 mmol/L (ref 3.5–5.1)
Sodium: 131 mmol/L — ABNORMAL LOW (ref 135–145)

## 2021-03-18 LAB — MRSA NEXT GEN BY PCR, NASAL: MRSA by PCR Next Gen: NOT DETECTED

## 2021-03-18 LAB — SODIUM, URINE, RANDOM: Sodium, Ur: 42 mmol/L

## 2021-03-18 LAB — STREP PNEUMONIAE URINARY ANTIGEN: Strep Pneumo Urinary Antigen: NEGATIVE

## 2021-03-18 LAB — OSMOLALITY, URINE: Osmolality, Ur: 199 mOsm/kg — ABNORMAL LOW (ref 300–900)

## 2021-03-18 MED ORDER — SODIUM CHLORIDE 0.9 % IV SOLN: 500.0000 mg | INTRAVENOUS | Status: DC

## 2021-03-18 MED ORDER — PANTOPRAZOLE SODIUM 40 MG PO TBEC
40.0000 mg | DELAYED_RELEASE_TABLET | Freq: Every day | ORAL | Status: DC
  Administered 2021-03-18: 40 mg via ORAL
  Filled 2021-03-18: qty 1

## 2021-03-18 MED ORDER — HYDROXYCHLOROQUINE SULFATE 200 MG PO TABS
200.0000 mg | ORAL_TABLET | Freq: Every day | ORAL | Status: DC
  Administered 2021-03-18 – 2021-03-19 (×2): 200 mg via ORAL
  Filled 2021-03-18 (×3): qty 1

## 2021-03-18 MED ORDER — SODIUM CHLORIDE 0.9 % IV SOLN: 2.0000 g | INTRAVENOUS | Status: DC

## 2021-03-18 MED ORDER — ERGOCALCIFEROL 1.25 MG (50000 UT) PO CAPS: 50000.0000 [IU] | ORAL_CAPSULE | ORAL | Status: DC

## 2021-03-18 MED ORDER — SEVELAMER CARBONATE 800 MG PO TABS
1600.0000 mg | ORAL_TABLET | Freq: Three times a day (TID) | ORAL | Status: DC
  Administered 2021-03-18 – 2021-03-19 (×3): 1600 mg via ORAL
  Filled 2021-03-18 (×5): qty 2

## 2021-03-18 MED ORDER — MYCOPHENOLATE MOFETIL 250 MG PO CAPS
500.0000 mg | ORAL_CAPSULE | Freq: Two times a day (BID) | ORAL | Status: DC
  Administered 2021-03-18 – 2021-03-19 (×2): 500 mg via ORAL
  Filled 2021-03-18 (×4): qty 2

## 2021-03-18 MED ORDER — FERROUS SULFATE 325 (65 FE) MG PO TABS
325.0000 mg | ORAL_TABLET | ORAL | Status: DC
  Administered 2021-03-18: 325 mg via ORAL
  Filled 2021-03-18 (×3): qty 1

## 2021-03-18 MED ORDER — SODIUM CHLORIDE 0.9 % IV SOLN
500.0000 mg | INTRAVENOUS | Status: DC
  Administered 2021-03-18: 500 mg via INTRAVENOUS
  Filled 2021-03-18: qty 5
  Filled 2021-03-18: qty 500

## 2021-03-18 MED ORDER — CHLORHEXIDINE GLUCONATE CLOTH 2 % EX PADS
6.0000 | MEDICATED_PAD | Freq: Every day | CUTANEOUS | Status: DC
  Administered 2021-03-19: 6 via TOPICAL
  Filled 2021-03-18 (×2): qty 6

## 2021-03-18 MED ORDER — GUAIFENESIN-DM 100-10 MG/5ML PO SYRP: 5.0000 mL | ORAL_SOLUTION | ORAL | Status: DC | PRN

## 2021-03-18 NOTE — Progress Notes (Signed)
[facility]  ROUNDING NOTE   Subjective:   Carol Hicks is a 56 y.o. female with a medical history of lupus nephritis and end-stage renal disease on dialysis.  Patient presents to the emergency department with complaints of cough and congestion.  She has been admitted for Community acquired pneumonia [J18.9] CAP (community acquired pneumonia) [J18.9]   Patient is known to our clinic and receives her outpatient treatments at [facility], supervised by [facility] physicians.  She receives her treatment on a TTS schedule.  Last treatment was received on Tuesday, she received a full treatment.  Patient states she had a fever of 102 at home along with cough and congestion.  Reports green sputum with cough.  Currently denies nausea, vomiting, and diarrhea.  Labs include sodium 129, potassium 3.4, glucose 109, BUN 40, and creatinine 4.79 with GFR 10.  Chest x-ray shows patchy right upper lobe and left basilar opacities, possibly pneumonia.  We have been consulted to provide dialysis during this admission   Objective:  Vital signs in last 24 hours:  Temp:  [99.3 F (37.4 C)-100.4 F (38 C)] 100.2 F (37.9 C) (01/11 2251) Pulse Rate:  [95-113] 103 (01/12 1137) Resp:  [17-23] 23 (01/12 1137) BP: (108-135)/(59-78) 122/77 (01/12 1130) SpO2:  [90 %-100 %] 100 % (01/12 1137) Weight:  [59 kg] 59 kg (01/11 1522)  Weight change:  Filed Weights   03/17/21 1522  Weight: 59 kg    Intake/Output: I/O last 3 completed shifts: In: 250 [IV Piggyback:250] Out: -    Intake/Output this shift:  No intake/output data recorded.  Physical Exam: General: NAD, resting in bed  Head: Normocephalic, atraumatic. Moist oral mucosal membranes  Eyes: Anicteric  Lungs:  Clear to auscultation, normal effort  Heart: Regular rate and rhythm  Abdomen:  Soft, nontender,   Extremities:  1+ peripheral edema.  Neurologic: Nonfocal, moving all four extremities  Skin: No lesions  Access: LUE aVF     Basic Metabolic Panel: Recent Labs  Lab 03/17/21 1523 03/17/21 2133 03/18/21 0513  NA 129*  --  131*  K 3.4*  --  3.6  CL 90*  --  96*  CO2 24  --  22  GLUCOSE 109*  --  83  BUN 40*  --  43*  CREATININE 4.79*  --  4.97*  CALCIUM 8.6*  --  8.1*  MG  --  2.0  --   PHOS  --  2.5  --     Liver Function Tests: No results for input(s): AST, ALT, ALKPHOS, BILITOT, PROT, ALBUMIN in the last 168 hours. No results for input(s): LIPASE, AMYLASE in the last 168 hours. No results for input(s): AMMONIA in the last 168 hours.  CBC: Recent Labs  Lab 03/17/21 1523 03/18/21 0513  WBC 10.4 9.1  HGB 11.2* 10.0*  HCT 35.7* 32.5*  MCV 96.7 97.6  PLT 264 214    Cardiac Enzymes: No results for input(s): CKTOTAL, CKMB, CKMBINDEX, TROPONINI in the last 168 hours.  BNP: Invalid input(s): POCBNP  CBG: No results for input(s): GLUCAP in the last 168 hours.  Microbiology: Results for orders placed or performed during the hospital encounter of 03/17/21  Resp Panel by RT-PCR (Flu A&B, Covid) Nasopharyngeal Swab     Status: None   Collection Time: 03/17/21  3:23 PM   Specimen: Nasopharyngeal Swab; Nasopharyngeal(NP) swabs in vial transport medium  Result Value Ref Range Status   SARS Coronavirus 2 by RT PCR NEGATIVE NEGATIVE Final  Comment: (NOTE) SARS-CoV-2 target nucleic acids are NOT DETECTED.  The SARS-CoV-2 RNA is generally detectable in upper respiratory specimens during the acute phase of infection. The lowest concentration of SARS-CoV-2 viral copies this assay can detect is 138 copies/mL. A negative result does not preclude SARS-Cov-2 infection and should not be used as the sole basis for treatment or other patient management decisions. A negative result may occur with  improper specimen collection/handling, submission of specimen other than nasopharyngeal swab, presence of viral mutation(s) within the areas targeted by this assay, and inadequate number of  viral copies(<138 copies/mL). A negative result must be combined with clinical observations, patient history, and epidemiological information. The expected result is Negative.  Fact Sheet for Patients:  EntrepreneurPulse.com.au  Fact Sheet for Healthcare Providers:  IncredibleEmployment.be  This test is no t yet approved or cleared by the [place] FDA and  has been authorized for detection and/or diagnosis of SARS-CoV-2 by FDA under an Emergency Use Authorization (EUA). This EUA will remain  in effect (meaning this test can be used) for the duration of the COVID-19 declaration under Section 564(b)(1) of the Act, 21 U.S.C.section 360bbb-3(b)(1), unless the authorization is terminated  or revoked sooner.       Influenza A by PCR NEGATIVE NEGATIVE Final   Influenza B by PCR NEGATIVE NEGATIVE Final    Comment: (NOTE) The Xpert Xpress SARS-CoV-2/FLU/RSV plus assay is intended as an aid in the diagnosis of influenza from Nasopharyngeal swab specimens and should not be used as a sole basis for treatment. Nasal washings and aspirates are unacceptable for Xpert Xpress SARS-CoV-2/FLU/RSV testing.  Fact Sheet for Patients: EntrepreneurPulse.com.au  Fact Sheet for Healthcare Providers: IncredibleEmployment.be  This test is not yet approved or cleared by the [place] FDA and has been authorized for detection and/or diagnosis of SARS-CoV-2 by FDA under an Emergency Use Authorization (EUA). This EUA will remain in effect (meaning this test can be used) for the duration of the COVID-19 declaration under Section 564(b)(1) of the Act, 21 U.S.C. section 360bbb-3(b)(1), unless the authorization is terminated or revoked.  Performed at [facility], [address] [place], [place] [address]   Culture, blood (routine x 2)     Status: None (Preliminary result)   Collection Time: 03/17/21  9:46 PM    Specimen: BLOOD  Result Value Ref Range Status   Specimen Description BLOOD RW  Final   Special Requests BOTTLES DRAWN AEROBIC AND ANAEROBIC BCLV  Final   Culture   Final    NO GROWTH < 12 HOURS Performed at [facility], [address], [place], [place] [address]    Report Status PENDING  Incomplete  Culture, blood (routine x 2)     Status: None (Preliminary result)   Collection Time: 03/17/21  9:46 PM   Specimen: BLOOD  Result Value Ref Range Status   Specimen Description BLOOD RFOA  Final   Special Requests BOTTLES DRAWN AEROBIC AND ANAEROBIC BCLV  Final   Culture   Final    NO GROWTH < 12 HOURS Performed at [facility], [address] [place], [place] [address]    Report Status PENDING  Incomplete  MRSA Next Gen by PCR, Nasal     Status: None   Collection Time: 03/18/21 12:10 AM   Specimen: Urine, Clean Catch; Nasal Swab  Result Value Ref Range Status   MRSA by PCR Next Gen NOT DETECTED NOT DETECTED Final    Comment: (NOTE) The GeneXpert MRSA Assay (FDA approved  for NASAL specimens only), is one component of a comprehensive MRSA colonization surveillance program. It is not intended to diagnose MRSA infection nor to guide or monitor treatment for MRSA infections. Test performance is not FDA approved in patients less than 74 years old. Performed at [facility], [address] [place], [place] [address]     Coagulation Studies: No results for input(s): LABPROT, INR in the last 72 hours.  Urinalysis: No results for input(s): COLORURINE, LABSPEC, PHURINE, GLUCOSEU, HGBUR, BILIRUBINUR, KETONESUR, PROTEINUR, UROBILINOGEN, NITRITE, LEUKOCYTESUR in the last 72 hours.  Invalid input(s): APPERANCEUR    Imaging: DG Chest 2 View  Result Date: 03/17/2021 CLINICAL DATA:  Shortness of breath. Tested positive for COVID 2 weeks ago. EXAM: CHEST - 2 VIEW COMPARISON:  Radiographs 06/11/2020. FINDINGS: The heart size and mediastinal contours are stable  without definite adenopathy. There is interval improved aeration of the retrocardiac left lower lobe with residual patchy airspace disease and blunting of the left costophrenic angle. There is also patchy airspace opacity projecting over the right upper lobe. No consolidation, large pleural effusion or pneumothorax. There are degenerative changes within the spine. IMPRESSION: Patchy right upper lobe and left basilar airspace opacities which could reflect pneumonia. Followup PA and lateral chest X-ray is recommended in 3-4 weeks following appropriate therapy to ensure resolution and exclude underlying malignancy. Electronically Signed   By: Richardean Sale M.D.   On: 03/17/2021 16:05     Medications:    azithromycin     cefTRIAXone (ROCEPHIN)  IV      amLODipine  10 mg Oral Daily   Chlorhexidine Gluconate Cloth  6 each Topical Q0600   ferrous sulfate  325 mg Oral QODAY   heparin  5,000 Units Subcutaneous Q8H   hydroxychloroquine  200 mg Oral Daily   mycophenolate  500 mg Oral BID   pantoprazole  40 mg Oral QHS   sevelamer carbonate  1,600 mg Oral TID with meals   acetaminophen **OR** acetaminophen, guaiFENesin-dextromethorphan, ondansetron **OR** ondansetron (ZOFRAN) IV  Assessment/ Plan:  Ms. NOHELIA VALENZA is a 56 y.o.  female with a medical history of lupus nephritis and end-stage renal disease on dialysis.  Patient presents to the emergency department with complaints of cough and congestion.  She has been admitted for Community acquired pneumonia [J18.9] CAP (community acquired pneumonia) [J18.9]   [facility]/TTS/LUE AVF/49.5 kg  End-stage renal disease on hemodialysis.  Will maintain outpatient schedule during this admission, if possible.  Plan to dialyze patient today to maintain outpatient schedule.  UF goal 2 L as tolerated.  Next treatment scheduled for Saturday.  2. Anemia of chronic kidney disease Lab Results  Component Value Date   HGB 10.0 (L) 03/18/2021    Hemoglobin  within acceptable range, will continue to monitor  3. Secondary Hyperparathyroidism: Lab Results  Component Value Date   PTH 175 (H) 06/15/2020   CALCIUM 8.1 (L) 03/18/2021   PHOS 2.5 03/17/2021    Calcium and phosphorus within acceptable range.  Continue sevelamer with meals  4.  Community-acquired pneumonia seen on chest x-ray. -currently prescribed IV azithromycin and ceftriaxone.   LOS: 0 Yaslin Kirtley 1/12/202312:33 PM

## 2021-03-18 NOTE — Progress Notes (Signed)
Patient attends an unscheduled 3-hour treatment, having presented to the ED with concerns of cough and congestion. LLA AVF cannulates without difficulty, maintains prescribed BFR. Challenged UF and able to removed 2.5 liters.  Labs drawn, no meds given. Patient is stable, VSS, afebrile, transferred to assigned room, primary nurse given report.

## 2021-03-18 NOTE — Progress Notes (Signed)
PROGRESS NOTE    Carol Hicks  JHE:[id number] DOB: 05-03-1965 DOA: 03/17/2021 PCP: Tracie Harrier, MD    Brief Narrative:  56 y.o. female with medical history significant for end-stage renal disease on hemodialysis, lupus nephritis, lupus, who presents emergency department for chief concerns of cough and congestion.   At bedside, she was able to tell me her name, her age, current location of hospital.   She reports that she has been having cough, congestion, and chest pain with cough. These symptoms have been Saturday. She endorses fever at home of 102. She took tylenol for the fever at home. She endorses productive cough that is green and that this started at the same time that she started coughing and having congestion.   Assessment & Plan:   Principal Problem:   Community acquired pneumonia Active Problems:   Hyponatremia   Lupus ([place])   ESRD (end stage renal disease) ([place])   Lupus nephritis ([place])   Personal history of COVID-19   CAP (community acquired pneumonia)  Community-acquired pneumonia Cough and congestion Hemodynamically stable Does not meet sepsis criteria Plan: Continue ceftriaxone Continue azithromycin Monitor infectious work-up Monitor vitals and fever curve Supplemental oxygen if necessary   # Hyponatremia Mild, asymptomatic Checking urine Legionella   # End-stage renal disease on hemodialysis Nephrology consulted TTS schedule Plan for hemodialysis today in house   # Lupus with lupus nephritis resumed home mycophenolate 500 mg in the morning and at bedtime -Hydroxychloroquine 200 mg p.o. daily resumed   # Patient tested positive for COVID on 03/09/2021 - Patient is not vaccinated for COVID-19 -Tested negative on 1/11  DVT prophylaxis: SQ heparin Code Status: Full Family Communication: Daughter at bedside 1/12 Disposition Plan: Status is: Inpatient  Remains inpatient appropriate because: CAP.  ESRD on hemodialysis.  Will need 24 to 48  hours of IV antibiotics       Level of care: Med-Surg  Consultants:  Nephrology  Procedures:  None  Antimicrobials: Ceftriaxone Azithromycin   Subjective: Seen and examined.  History conducted in [place].  Patient endorses slight improvement in cough and shortness of breath over interval.  No pain complaints.  Objective: Vitals:   03/18/21 0854 03/18/21 0856 03/18/21 1136 03/18/21 1137  BP: 119/63     Pulse: (!) 105 (!) 107 (!) 105 (!) 103  Resp: 20 18 (!) 22 (!) 23  Temp:      TempSrc:      SpO2: 90% 92% 100% 100%  Weight:      Height:        Intake/Output Summary (Last 24 hours) at 03/18/2021 1156 Last data filed at 03/18/2021 0005 Gross per 24 hour  Intake 250 ml  Output --  Net 250 ml   Filed Weights   03/17/21 1522  Weight: 59 kg    Examination:  General exam: No acute distress.  Appears fatigued Respiratory system: Scattered crackles bilaterally.  Normal work of breathing.  Room air Cardiovascular system: S1-S2, tachycardic, no murmurs, no pedal edema Gastrointestinal system: Soft, NT/ND, normal bowel sounds Central nervous system: Alert and oriented. No focal neurological deficits. Extremities: Symmetric 5 x 5 power.  Left upper extremity AVF Skin: No rashes, lesions or ulcers Psychiatry: Judgement and insight appear normal. Mood & affect appropriate.     Data Reviewed: I have personally reviewed following labs and imaging studies  CBC: Recent Labs  Lab 03/17/21 1523 03/18/21 0513  WBC 10.4 9.1  HGB 11.2* 10.0*  HCT 35.7* 32.5*  MCV 96.7 97.6  PLT  264 637   Basic Metabolic Panel: Recent Labs  Lab 03/17/21 1523 03/17/21 2133 03/18/21 0513  NA 129*  --  131*  K 3.4*  --  3.6  CL 90*  --  96*  CO2 24  --  22  GLUCOSE 109*  --  83  BUN 40*  --  43*  CREATININE 4.79*  --  4.97*  CALCIUM 8.6*  --  8.1*  MG  --  2.0  --   PHOS  --  2.5  --    GFR: Estimated Creatinine Clearance: 10.7 mL/min (A) (by C-G formula based on SCr of  4.97 mg/dL (H)). Liver Function Tests: No results for input(s): AST, ALT, ALKPHOS, BILITOT, PROT, ALBUMIN in the last 168 hours. No results for input(s): LIPASE, AMYLASE in the last 168 hours. No results for input(s): AMMONIA in the last 168 hours. Coagulation Profile: No results for input(s): INR, PROTIME in the last 168 hours. Cardiac Enzymes: No results for input(s): CKTOTAL, CKMB, CKMBINDEX, TROPONINI in the last 168 hours. BNP (last 3 results) No results for input(s): PROBNP in the last 8760 hours. HbA1C: No results for input(s): HGBA1C in the last 72 hours. CBG: No results for input(s): GLUCAP in the last 168 hours. Lipid Profile: No results for input(s): CHOL, HDL, LDLCALC, TRIG, CHOLHDL, LDLDIRECT in the last 72 hours. Thyroid Function Tests: No results for input(s): TSH, T4TOTAL, FREET4, T3FREE, THYROIDAB in the last 72 hours. Anemia Panel: No results for input(s): VITAMINB12, FOLATE, FERRITIN, TIBC, IRON, RETICCTPCT in the last 72 hours. Sepsis Labs: Recent Labs  Lab 03/17/21 1523 03/17/21 2146  PROCALCITON 0.41  --   LATICACIDVEN 0.9 1.0    Recent Results (from the past 240 hour(s))  Resp Panel by RT-PCR (Flu A&B, Covid) Nasopharyngeal Swab     Status: None   Collection Time: 03/17/21  3:23 PM   Specimen: Nasopharyngeal Swab; Nasopharyngeal(NP) swabs in vial transport medium  Result Value Ref Range Status   SARS Coronavirus 2 by RT PCR NEGATIVE NEGATIVE Final    Comment: (NOTE) SARS-CoV-2 target nucleic acids are NOT DETECTED.  The SARS-CoV-2 RNA is generally detectable in upper respiratory specimens during the acute phase of infection. The lowest concentration of SARS-CoV-2 viral copies this assay can detect is 138 copies/mL. A negative result does not preclude SARS-Cov-2 infection and should not be used as the sole basis for treatment or other patient management decisions. A negative result may occur with  improper specimen collection/handling, submission of  specimen other than nasopharyngeal swab, presence of viral mutation(s) within the areas targeted by this assay, and inadequate number of viral copies(<138 copies/mL). A negative result must be combined with clinical observations, patient history, and epidemiological information. The expected result is Negative.  Fact Sheet for Patients:  EntrepreneurPulse.com.au  Fact Sheet for Healthcare Providers:  IncredibleEmployment.be  This test is no t yet approved or cleared by the [place] FDA and  has been authorized for detection and/or diagnosis of SARS-CoV-2 by FDA under an Emergency Use Authorization (EUA). This EUA will remain  in effect (meaning this test can be used) for the duration of the COVID-19 declaration under Section 564(b)(1) of the Act, 21 U.S.C.section 360bbb-3(b)(1), unless the authorization is terminated  or revoked sooner.       Influenza A by PCR NEGATIVE NEGATIVE Final   Influenza B by PCR NEGATIVE NEGATIVE Final    Comment: (NOTE) The Xpert Xpress SARS-CoV-2/FLU/RSV plus assay is intended as an aid in the diagnosis of influenza from  Nasopharyngeal swab specimens and should not be used as a sole basis for treatment. Nasal washings and aspirates are unacceptable for Xpert Xpress SARS-CoV-2/FLU/RSV testing.  Fact Sheet for Patients: EntrepreneurPulse.com.au  Fact Sheet for Healthcare Providers: IncredibleEmployment.be  This test is not yet approved or cleared by the [place] FDA and has been authorized for detection and/or diagnosis of SARS-CoV-2 by FDA under an Emergency Use Authorization (EUA). This EUA will remain in effect (meaning this test can be used) for the duration of the COVID-19 declaration under Section 564(b)(1) of the Act, 21 U.S.C. section 360bbb-3(b)(1), unless the authorization is terminated or revoked.  Performed at [facility], [address] [place], [place] [address]   Culture, blood (routine x 2)     Status: None (Preliminary result)   Collection Time: 03/17/21  9:46 PM   Specimen: BLOOD  Result Value Ref Range Status   Specimen Description BLOOD RW  Final   Special Requests BOTTLES DRAWN AEROBIC AND ANAEROBIC BCLV  Final   Culture   Final    NO GROWTH < 12 HOURS Performed at [facility], [address], [place], [place] [address]    Report Status PENDING  Incomplete  Culture, blood (routine x 2)     Status: None (Preliminary result)   Collection Time: 03/17/21  9:46 PM   Specimen: BLOOD  Result Value Ref Range Status   Specimen Description BLOOD RFOA  Final   Special Requests BOTTLES DRAWN AEROBIC AND ANAEROBIC BCLV  Final   Culture   Final    NO GROWTH < 12 HOURS Performed at [facility], [address] [place], [place] [address]    Report Status PENDING  Incomplete  MRSA Next Gen by PCR, Nasal     Status: None   Collection Time: 03/18/21 12:10 AM   Specimen: Urine, Clean Catch; Nasal Swab  Result Value Ref Range Status   MRSA by PCR Next Gen NOT DETECTED NOT DETECTED Final    Comment: (NOTE) The GeneXpert MRSA Assay (FDA approved for NASAL specimens only), is one component of a comprehensive MRSA colonization surveillance program. It is not intended to diagnose MRSA infection nor to guide or monitor treatment for MRSA infections. Test performance is not FDA approved in patients less than 64 years old. Performed at [facility], [address], [place], [place] [address]          Radiology Studies: DG Chest 2 View  Result Date: 03/17/2021 CLINICAL DATA:  Shortness of breath. Tested positive for COVID 2 weeks ago. EXAM: CHEST - 2 VIEW COMPARISON:  Radiographs 06/11/2020. FINDINGS: The heart size and mediastinal contours are stable without definite adenopathy. There is interval improved aeration of the retrocardiac left lower lobe with residual patchy airspace disease and  blunting of the left costophrenic angle. There is also patchy airspace opacity projecting over the right upper lobe. No consolidation, large pleural effusion or pneumothorax. There are degenerative changes within the spine. IMPRESSION: Patchy right upper lobe and left basilar airspace opacities which could reflect pneumonia. Followup PA and lateral chest X-ray is recommended in 3-4 weeks following appropriate therapy to ensure resolution and exclude underlying malignancy. Electronically Signed   By: Richardean Sale M.D.   On: 03/17/2021 16:05        Scheduled Meds:  amLODipine  10 mg Oral Daily   Chlorhexidine Gluconate Cloth  6 each Topical Q0600   ferrous sulfate  325 mg Oral QODAY   heparin  5,000 Units Subcutaneous  Q8H   hydroxychloroquine  200 mg Oral Daily   mycophenolate  500 mg Oral BID   pantoprazole  40 mg Oral QHS   Continuous Infusions:  azithromycin     cefTRIAXone (ROCEPHIN)  IV       LOS: 0 days    Time spent: 45 minutes    Sidney Ace, MD [facility]   If 7PM-7AM, please contact night-coverage  03/18/2021, 11:56 AM

## 2021-03-19 DIAGNOSIS — J189 Pneumonia, unspecified organism: Secondary | ICD-10-CM | POA: Diagnosis not present

## 2021-03-19 MED ORDER — ORAL CARE MOUTH RINSE
15.0000 mL | Freq: Two times a day (BID) | OROMUCOSAL | Status: DC
  Administered 2021-03-19: 15 mL via OROMUCOSAL

## 2021-03-19 MED ORDER — BENZONATATE 100 MG PO CAPS: 100.0000 mg | ORAL_CAPSULE | Freq: Three times a day (TID) | ORAL | 0 refills | Status: AC | PRN

## 2021-03-19 MED ORDER — PANTOPRAZOLE SODIUM 40 MG PO TBEC: 40.0000 mg | DELAYED_RELEASE_TABLET | Freq: Every day | ORAL | 0 refills | Status: AC

## 2021-03-19 MED ORDER — LEVOFLOXACIN 500 MG PO TABS: 500.0000 mg | ORAL_TABLET | ORAL | 0 refills | Status: AC

## 2021-03-19 NOTE — Progress Notes (Signed)
[facility]  ROUNDING NOTE   Subjective:   Carol Hicks is a 56 y.o. female with a medical history of lupus nephritis and end-stage renal disease on dialysis.  Patient presents to the emergency department with complaints of cough and congestion.  She has been admitted for Community acquired pneumonia [J18.9] CAP (community acquired pneumonia) [J18.9] ESRD on dialysis ([place]) [N18.6, Z99.2] Community acquired pneumonia, unspecified laterality [J18.9] Sepsis without acute organ dysfunction, due to unspecified organism [facility]) [A41.9]   Patient is known to our clinic and receives her outpatient treatments at [facility], supervised by [facility] physicians.  She receives her treatment on a TTS schedule.    Patient seen sitting at side of bed, eating breakfast Alert and oriented States cough has improved but still remains, nonproductive Denies shortness of breath on exertion, 3 L [place] Tolerating meals   Objective:  Vital signs in last 24 hours:  Temp:  [98 F (36.7 C)-100.1 F (37.8 C)] 98 F (36.7 C) (01/13 0751) Pulse Rate:  [87-110] 87 (01/13 0751) Resp:  [14-24] 18 (01/13 0751) BP: (100-122)/(52-92) 100/52 (01/13 0751) SpO2:  [94 %-100 %] 94 % (01/13 0751) Weight:  [47.8 kg] 47.8 kg (01/12 1700)  Weight change: -8.198 kg Filed Weights   03/17/21 1522 03/18/21 1137 03/18/21 1700  Weight: 59 kg 50.8 kg 47.8 kg    Intake/Output: I/O last 3 completed shifts: In: 5 [P.O.:120; IV Piggyback:600] Out: 2500 [Other:2500]   Intake/Output this shift:  Total I/O In: 120 [P.O.:120] Out: -   Physical Exam: General: NAD, sitting at bedside  Head: Normocephalic, atraumatic. Moist oral mucosal membranes  Eyes: Anicteric  Lungs:  Basilar crackles, normal effort  Heart: Regular rate and rhythm  Abdomen:  Soft, nontender,   Extremities: Trace peripheral edema.  Neurologic: Nonfocal, moving all four extremities  Skin: No lesions  Access: LUE aVF    Basic Metabolic  Panel: Recent Labs  Lab 03/17/21 1523 03/17/21 2133 03/18/21 0513  NA 129*  --  131*  K 3.4*  --  3.6  CL 90*  --  96*  CO2 24  --  22  GLUCOSE 109*  --  83  BUN 40*  --  43*  CREATININE 4.79*  --  4.97*  CALCIUM 8.6*  --  8.1*  MG  --  2.0  --   PHOS  --  2.5  --      Liver Function Tests: No results for input(s): AST, ALT, ALKPHOS, BILITOT, PROT, ALBUMIN in the last 168 hours. No results for input(s): LIPASE, AMYLASE in the last 168 hours. No results for input(s): AMMONIA in the last 168 hours.  CBC: Recent Labs  Lab 03/17/21 1523 03/18/21 0513  WBC 10.4 9.1  HGB 11.2* 10.0*  HCT 35.7* 32.5*  MCV 96.7 97.6  PLT 264 214     Cardiac Enzymes: No results for input(s): CKTOTAL, CKMB, CKMBINDEX, TROPONINI in the last 168 hours.  BNP: Invalid input(s): POCBNP  CBG: No results for input(s): GLUCAP in the last 168 hours.  Microbiology: Results for orders placed or performed during the hospital encounter of 03/17/21  Resp Panel by RT-PCR (Flu A&B, Covid) Nasopharyngeal Swab     Status: None   Collection Time: 03/17/21  3:23 PM   Specimen: Nasopharyngeal Swab; Nasopharyngeal(NP) swabs in vial transport medium  Result Value Ref Range Status   SARS Coronavirus 2 by RT PCR NEGATIVE NEGATIVE Final    Comment: (NOTE) SARS-CoV-2 target nucleic acids are NOT DETECTED.  The SARS-CoV-2  RNA is generally detectable in upper respiratory specimens during the acute phase of infection. The lowest concentration of SARS-CoV-2 viral copies this assay can detect is 138 copies/mL. A negative result does not preclude SARS-Cov-2 infection and should not be used as the sole basis for treatment or other patient management decisions. A negative result may occur with  improper specimen collection/handling, submission of specimen other than nasopharyngeal swab, presence of viral mutation(s) within the areas targeted by this assay, and inadequate number of viral copies(<138 copies/mL). A  negative result must be combined with clinical observations, patient history, and epidemiological information. The expected result is Negative.  Fact Sheet for Patients:  EntrepreneurPulse.com.au  Fact Sheet for Healthcare Providers:  IncredibleEmployment.be  This test is no t yet approved or cleared by the [place] FDA and  has been authorized for detection and/or diagnosis of SARS-CoV-2 by FDA under an Emergency Use Authorization (EUA). This EUA will remain  in effect (meaning this test can be used) for the duration of the COVID-19 declaration under Section 564(b)(1) of the Act, 21 U.S.C.section 360bbb-3(b)(1), unless the authorization is terminated  or revoked sooner.       Influenza A by PCR NEGATIVE NEGATIVE Final   Influenza B by PCR NEGATIVE NEGATIVE Final    Comment: (NOTE) The Xpert Xpress SARS-CoV-2/FLU/RSV plus assay is intended as an aid in the diagnosis of influenza from Nasopharyngeal swab specimens and should not be used as a sole basis for treatment. Nasal washings and aspirates are unacceptable for Xpert Xpress SARS-CoV-2/FLU/RSV testing.  Fact Sheet for Patients: EntrepreneurPulse.com.au  Fact Sheet for Healthcare Providers: IncredibleEmployment.be  This test is not yet approved or cleared by the [place] FDA and has been authorized for detection and/or diagnosis of SARS-CoV-2 by FDA under an Emergency Use Authorization (EUA). This EUA will remain in effect (meaning this test can be used) for the duration of the COVID-19 declaration under Section 564(b)(1) of the Act, 21 U.S.C. section 360bbb-3(b)(1), unless the authorization is terminated or revoked.  Performed at [facility], [address] [place], [place] [address]   Culture, blood (routine x 2)     Status: None (Preliminary result)   Collection Time: 03/17/21  9:46 PM   Specimen: BLOOD  Result Value Ref  Range Status   Specimen Description BLOOD RW  Final   Special Requests BOTTLES DRAWN AEROBIC AND ANAEROBIC BCLV  Final   Culture   Final    NO GROWTH 2 DAYS Performed at [facility], [address], [place], [place] [address]    Report Status PENDING  Incomplete  Culture, blood (routine x 2)     Status: None (Preliminary result)   Collection Time: 03/17/21  9:46 PM   Specimen: BLOOD  Result Value Ref Range Status   Specimen Description BLOOD RFOA  Final   Special Requests BOTTLES DRAWN AEROBIC AND ANAEROBIC BCLV  Final   Culture   Final    NO GROWTH 2 DAYS Performed at [facility], [address], [place],  [address]    Report Status PENDING  Incomplete  MRSA Next Gen by PCR, Nasal     Status: None   Collection Time: 03/18/21 12:10 AM   Specimen: Urine, Clean Catch; Nasal Swab  Result Value Ref Range Status   MRSA by PCR Next Gen NOT DETECTED NOT DETECTED Final    Comment: (NOTE) The GeneXpert MRSA Assay (FDA approved for NASAL specimens only), is one component of a comprehensive MRSA colonization surveillance program.  It is not intended to diagnose MRSA infection nor to guide or monitor treatment for MRSA infections. Test performance is not FDA approved in patients less than 80 years old. Performed at [facility], [address] [place], [place] [address]     Coagulation Studies: No results for input(s): LABPROT, INR in the last 72 hours.  Urinalysis: No results for input(s): COLORURINE, LABSPEC, PHURINE, GLUCOSEU, HGBUR, BILIRUBINUR, KETONESUR, PROTEINUR, UROBILINOGEN, NITRITE, LEUKOCYTESUR in the last 72 hours.  Invalid input(s): APPERANCEUR    Imaging: DG Chest 2 View  Result Date: 03/17/2021 CLINICAL DATA:  Shortness of breath. Tested positive for COVID 2 weeks ago. EXAM: CHEST - 2 VIEW COMPARISON:  Radiographs 06/11/2020. FINDINGS: The heart size and mediastinal contours are stable without definite adenopathy. There is  interval improved aeration of the retrocardiac left lower lobe with residual patchy airspace disease and blunting of the left costophrenic angle. There is also patchy airspace opacity projecting over the right upper lobe. No consolidation, large pleural effusion or pneumothorax. There are degenerative changes within the spine. IMPRESSION: Patchy right upper lobe and left basilar airspace opacities which could reflect pneumonia. Followup PA and lateral chest X-ray is recommended in 3-4 weeks following appropriate therapy to ensure resolution and exclude underlying malignancy. Electronically Signed   By: Richardean Sale M.D.   On: 03/17/2021 16:05     Medications:    azithromycin Stopped (03/18/21 2237)   cefTRIAXone (ROCEPHIN)  IV Stopped (03/18/21 2341)    amLODipine  10 mg Oral Daily   Chlorhexidine Gluconate Cloth  6 each Topical Q0600   ferrous sulfate  325 mg Oral QODAY   heparin  5,000 Units Subcutaneous Q8H   hydroxychloroquine  200 mg Oral Daily   mouth rinse  15 mL Mouth Rinse BID   mycophenolate  500 mg Oral BID   pantoprazole  40 mg Oral QHS   sevelamer carbonate  1,600 mg Oral TID with meals   acetaminophen **OR** acetaminophen, guaiFENesin-dextromethorphan, ondansetron **OR** ondansetron (ZOFRAN) IV  Assessment/ Plan:  Ms. Carol Hicks is a 56 y.o.  female with a medical history of lupus nephritis and end-stage renal disease on dialysis.  Patient presents to the emergency department with complaints of cough and congestion.  She has been admitted for Community acquired pneumonia [J18.9] CAP (community acquired pneumonia) [J18.9] ESRD on dialysis ([place]) [N18.6, Z99.2] Community acquired pneumonia, unspecified laterality [J18.9] Sepsis without acute organ dysfunction, due to unspecified organism ([place]) [A41.9]   [facility]/TTS/LUE AVF/49.5 kg  End-stage renal disease on hemodialysis.  Will maintain outpatient schedule during this admission, if possible.  Received dialysis  yesterday, UF goal 2.5 L achieved.  Next treatment scheduled for Saturday.  2. Anemia of chronic kidney disease Lab Results  Component Value Date   HGB 10.0 (L) 03/18/2021    Hemoglobin at goal  3. Secondary Hyperparathyroidism: Lab Results  Component Value Date   PTH 175 (H) 06/15/2020   CALCIUM 8.1 (L) 03/18/2021   PHOS 2.5 03/17/2021    We will continue to monitor bone mineral metabolism during this admission. Continue sevelamer with meals  4.  Community-acquired pneumonia seen on chest x-ray. -currently prescribed IV azithromycin and ceftriaxone.   LOS: 1 Valyn Latchford 1/13/202311:38 AM

## 2021-03-19 NOTE — Progress Notes (Signed)
Patient discharged to home via transport with no complications. Discharge instructions given to patient and family with no complications. Medications sent to [facility] with no complications. [id number] Removed.

## 2021-03-19 NOTE — Discharge Summary (Signed)
Physician Discharge Summary  Carol Hicks EXH:[id number] DOB: 1965/10/09 DOA: 03/17/2021  PCP: Tracie Harrier, MD  Admit date: 03/17/2021 Discharge date: 03/19/2021  Admitted From: Home Disposition: Home  Recommendations for Outpatient Follow-up:  Follow up with PCP in 1-2 weeks Follow-up with nephrology as directed  Home Health: No Equipment/Devices: None  Discharge Condition: Stable CODE STATUS: Full Diet recommendation: Renal with fluid restrict  Brief/Interim Summary: 56 y.o. female with medical history significant for end-stage renal disease on hemodialysis, lupus nephritis, lupus, who presents emergency department for chief concerns of cough and congestion.   At bedside, she was able to tell me her name, her age, current location of hospital.   She reports that she has been having cough, congestion, and chest pain with cough. These symptoms have been Saturday. She endorses fever at home of 102. She took tylenol for the fever at home. She endorses productive cough that is green and that this started at the same time that she started coughing and having congestion.  Symptoms improved at time of discharge.  Cough improved.  Afebrile over interval.  Weaned off supplemental oxygen.  Stable for discharge at this time.  Will discharge home and patient can resume her normal HD schedule.  Next HD for 1/14.  At time of discharge will recommend Levaquin 500 mg p.o. every 48 hours.  To be taken after HD on dialysis days.  As needed [employer] also prescribed.    Discharge Diagnoses:  Principal Problem:   Community acquired pneumonia Active Problems:   Hyponatremia   Lupus ([place])   ESRD (end stage renal disease) ([place])   Lupus nephritis ([place])   Personal history of COVID-19   CAP (community acquired pneumonia)  Community-acquired pneumonia Cough and congestion Hemodynamically stable Does not meet sepsis criteria Weaned off supplemental oxygen Plan: Discharge home.   Transition to p.o. Levaquin.  500 mg p.o. every 48 hours.  As needed [employer]   # Hyponatremia Mild, asymptomatic    # End-stage renal disease on hemodialysis Nephrology consulted TTS schedule Underwent successful HD on 1/12 Can resume outpatient regimen, Next HD 1/14   # Lupus with lupus nephritis resumed home mycophenolate 500 mg in the morning and at bedtime -Hydroxychloroquine 200 mg p.o. daily resumed   # Patient tested positive for COVID on 03/09/2021 - Patient is not vaccinated for COVID-19 -Tested negative on 1/11  Discharge Instructions  Discharge Instructions     Diet - low sodium heart healthy   Complete by: As directed    Increase activity slowly   Complete by: As directed       Allergies as of 03/19/2021   No Known Allergies      Medication List     STOP taking these medications    ergocalciferol 1.25 MG (50000 UT) capsule Commonly known as: VITAMIN D2   Iron 325 (65 Fe) MG Tabs       TAKE these medications    acetaminophen 500 MG tablet Commonly known as: TYLENOL Take 500 mg by mouth every 6 (six) hours as needed.   amLODipine 10 MG tablet Commonly known as: NORVASC Take 1 tablet by mouth daily.   benzonatate 100 MG capsule Commonly known as: Tessalon Perles Take 1 capsule (100 mg total) by mouth 3 (three) times daily as needed for cough.   guaiFENesin-dextromethorphan 100-10 MG/5ML syrup Commonly known as: ROBITUSSIN DM Take 5 mLs by mouth every 4 (four) hours as needed for cough.   hydroxychloroquine 200 MG tablet Commonly known as:  PLAQUENIL Take 200 mg by mouth every other day.   levofloxacin 500 MG tablet Commonly known as: Levaquin Take 1 tablet (500 mg total) by mouth every other day for 6 days. On dialysis days, take AFTER completing dialysis   mycophenolate 250 MG capsule Commonly known as: CELLCEPT Take 500 mg by mouth in the morning and at bedtime.   pantoprazole 40 MG tablet Commonly known as:  PROTONIX Take 1 tablet (40 mg total) by mouth at bedtime.   sevelamer carbonate 800 MG tablet Commonly known as: RENVELA Take 1,600 mg by mouth 3 (three) times daily.        No Known Allergies  Consultations: Nephrology   Procedures/Studies: DG Chest 2 View  Result Date: 03/17/2021 CLINICAL DATA:  Shortness of breath. Tested positive for COVID 2 weeks ago. EXAM: CHEST - 2 VIEW COMPARISON:  Radiographs 06/11/2020. FINDINGS: The heart size and mediastinal contours are stable without definite adenopathy. There is interval improved aeration of the retrocardiac left lower lobe with residual patchy airspace disease and blunting of the left costophrenic angle. There is also patchy airspace opacity projecting over the right upper lobe. No consolidation, large pleural effusion or pneumothorax. There are degenerative changes within the spine. IMPRESSION: Patchy right upper lobe and left basilar airspace opacities which could reflect pneumonia. Followup PA and lateral chest X-ray is recommended in 3-4 weeks following appropriate therapy to ensure resolution and exclude underlying malignancy. Electronically Signed   By: Richardean Sale M.D.   On: 03/17/2021 16:05      Subjective: Seen and examined on the day of discharge.  Stable no distress.  Cough improved.  Stable for discharge home  Discharge Exam: Vitals:   03/19/21 0330 03/19/21 0751  BP: 106/62 (!) 100/52  Pulse: 90 87  Resp: 19 18  Temp: 98.9 F (37.2 C) 98 F (36.7 C)  SpO2: 96% 94%   Vitals:   03/18/21 2005 03/18/21 2311 03/19/21 0330 03/19/21 0751  BP: 122/64  106/62 (!) 100/52  Pulse: (!) 110  90 87  Resp: 18  19 18   Temp: 100.1 F (37.8 C) 99.1 F (37.3 C) 98.9 F (37.2 C) 98 F (36.7 C)  TempSrc:  Oral    SpO2: 97%  96% 94%  Weight:      Height:        General: Pt is alert, awake, not in acute distress Cardiovascular: RRR, S1/S2 +, no rubs, no gallops Respiratory: CTA bilaterally, no wheezing, no  rhonchi Abdominal: Soft, NT, ND, bowel sounds + Extremities: no edema, no cyanosis    The results of significant diagnostics from this hospitalization (including imaging, microbiology, ancillary and laboratory) are listed below for reference.     Microbiology: Recent Results (from the past 240 hour(s))  Resp Panel by RT-PCR (Flu A&B, Covid) Nasopharyngeal Swab     Status: None   Collection Time: 03/17/21  3:23 PM   Specimen: Nasopharyngeal Swab; Nasopharyngeal(NP) swabs in vial transport medium  Result Value Ref Range Status   SARS Coronavirus 2 by RT PCR NEGATIVE NEGATIVE Final    Comment: (NOTE) SARS-CoV-2 target nucleic acids are NOT DETECTED.  The SARS-CoV-2 RNA is generally detectable in upper respiratory specimens during the acute phase of infection. The lowest concentration of SARS-CoV-2 viral copies this assay can detect is 138 copies/mL. A negative result does not preclude SARS-Cov-2 infection and should not be used as the sole basis for treatment or other patient management decisions. A negative result may occur with  improper specimen collection/handling, submission  of specimen other than nasopharyngeal swab, presence of viral mutation(s) within the areas targeted by this assay, and inadequate number of viral copies(<138 copies/mL). A negative result must be combined with clinical observations, patient history, and epidemiological information. The expected result is Negative.  Fact Sheet for Patients:  EntrepreneurPulse.com.au  Fact Sheet for Healthcare Providers:  IncredibleEmployment.be  This test is no t yet approved or cleared by the [place] FDA and  has been authorized for detection and/or diagnosis of SARS-CoV-2 by FDA under an Emergency Use Authorization (EUA). This EUA will remain  in effect (meaning this test can be used) for the duration of the COVID-19 declaration under Section 564(b)(1) of the Act,  21 U.S.C.section 360bbb-3(b)(1), unless the authorization is terminated  or revoked sooner.       Influenza A by PCR NEGATIVE NEGATIVE Final   Influenza B by PCR NEGATIVE NEGATIVE Final    Comment: (NOTE) The Xpert Xpress SARS-CoV-2/FLU/RSV plus assay is intended as an aid in the diagnosis of influenza from Nasopharyngeal swab specimens and should not be used as a sole basis for treatment. Nasal washings and aspirates are unacceptable for Xpert Xpress SARS-CoV-2/FLU/RSV testing.  Fact Sheet for Patients: EntrepreneurPulse.com.au  Fact Sheet for Healthcare Providers: IncredibleEmployment.be  This test is not yet approved or cleared by the [place] FDA and has been authorized for detection and/or diagnosis of SARS-CoV-2 by FDA under an Emergency Use Authorization (EUA). This EUA will remain in effect (meaning this test can be used) for the duration of the COVID-19 declaration under Section 564(b)(1) of the Act, 21 U.S.C. section 360bbb-3(b)(1), unless the authorization is terminated or revoked.  Performed at [facility], [address] [place], [place] [address]   Culture, blood (routine x 2)     Status: None (Preliminary result)   Collection Time: 03/17/21  9:46 PM   Specimen: BLOOD  Result Value Ref Range Status   Specimen Description BLOOD RW  Final   Special Requests BOTTLES DRAWN AEROBIC AND ANAEROBIC BCLV  Final   Culture   Final    NO GROWTH 2 DAYS Performed at [facility], [address] [place], [place] [address]    Report Status PENDING  Incomplete  Culture, blood (routine x 2)     Status: None (Preliminary result)   Collection Time: 03/17/21  9:46 PM   Specimen: BLOOD  Result Value Ref Range Status   Specimen Description BLOOD RFOA  Final   Special Requests BOTTLES DRAWN AEROBIC AND ANAEROBIC BCLV  Final   Culture   Final    NO GROWTH 2 DAYS Performed at [facility], [address], [place], [place] [address]    Report Status PENDING  Incomplete  MRSA Next Gen by PCR, Nasal     Status: None   Collection Time: 03/18/21 12:10 AM   Specimen: Urine, Clean Catch; Nasal Swab  Result Value Ref Range Status   MRSA by PCR Next Gen NOT DETECTED NOT DETECTED Final    Comment: (NOTE) The GeneXpert MRSA Assay (FDA approved for NASAL specimens only), is one component of a comprehensive MRSA colonization surveillance program. It is not intended to diagnose MRSA infection nor to guide or monitor treatment for MRSA infections. Test performance is not FDA approved in patients less than 55 years old. Performed at [facility], [address] [place],  [address]      Labs: BNP (last 3 results) No results for input(s): BNP in the last 8760 hours. Basic  Metabolic Panel: Recent Labs  Lab 03/17/21 1523 03/17/21 2133 03/18/21 0513  NA 129*  --  131*  K 3.4*  --  3.6  CL 90*  --  96*  CO2 24  --  22  GLUCOSE 109*  --  83  BUN 40*  --  43*  CREATININE 4.79*  --  4.97*  CALCIUM 8.6*  --  8.1*  MG  --  2.0  --   PHOS  --  2.5  --    Liver Function Tests: No results for input(s): AST, ALT, ALKPHOS, BILITOT, PROT, ALBUMIN in the last 168 hours. No results for input(s): LIPASE, AMYLASE in the last 168 hours. No results for input(s): AMMONIA in the last 168 hours. CBC: Recent Labs  Lab 03/17/21 1523 03/18/21 0513  WBC 10.4 9.1  HGB 11.2* 10.0*  HCT 35.7* 32.5*  MCV 96.7 97.6  PLT 264 214   Cardiac Enzymes: No results for input(s): CKTOTAL, CKMB, CKMBINDEX, TROPONINI in the last 168 hours. BNP: Invalid input(s): POCBNP CBG: No results for input(s): GLUCAP in the last 168 hours. D-Dimer No results for input(s): DDIMER in the last 72 hours. Hgb A1c No results for input(s): HGBA1C in the last 72 hours. Lipid Profile No results for input(s): CHOL, HDL, LDLCALC, TRIG, CHOLHDL, LDLDIRECT in the last 72 hours. Thyroid function studies No results for  input(s): TSH, T4TOTAL, T3FREE, THYROIDAB in the last 72 hours.  Invalid input(s): FREET3 Anemia work up No results for input(s): VITAMINB12, FOLATE, FERRITIN, TIBC, IRON, RETICCTPCT in the last 72 hours. Urinalysis    Component Value Date/Time   COLORURINE YELLOW (A) 06/10/2020 0606   APPEARANCEUR HAZY (A) 06/10/2020 0606   LABSPEC 1.011 06/10/2020 0606   PHURINE 5.0 06/10/2020 0606   GLUCOSEU 50 (A) 06/10/2020 0606   HGBUR MODERATE (A) 06/10/2020 0606   BILIRUBINUR NEGATIVE 06/10/2020 0606   KETONESUR NEGATIVE 06/10/2020 0606   PROTEINUR >=300 (A) 06/10/2020 0606   NITRITE NEGATIVE 06/10/2020 0606   LEUKOCYTESUR NEGATIVE 06/10/2020 0606   Sepsis Labs Invalid input(s): PROCALCITONIN,  WBC,  LACTICIDVEN Microbiology Recent Results (from the past 240 hour(s))  Resp Panel by RT-PCR (Flu A&B, Covid) Nasopharyngeal Swab     Status: None   Collection Time: 03/17/21  3:23 PM   Specimen: Nasopharyngeal Swab; Nasopharyngeal(NP) swabs in vial transport medium  Result Value Ref Range Status   SARS Coronavirus 2 by RT PCR NEGATIVE NEGATIVE Final    Comment: (NOTE) SARS-CoV-2 target nucleic acids are NOT DETECTED.  The SARS-CoV-2 RNA is generally detectable in upper respiratory specimens during the acute phase of infection. The lowest concentration of SARS-CoV-2 viral copies this assay can detect is 138 copies/mL. A negative result does not preclude SARS-Cov-2 infection and should not be used as the sole basis for treatment or other patient management decisions. A negative result may occur with  improper specimen collection/handling, submission of specimen other than nasopharyngeal swab, presence of viral mutation(s) within the areas targeted by this assay, and inadequate number of viral copies(<138 copies/mL). A negative result must be combined with clinical observations, patient history, and epidemiological information. The expected result is Negative.  Fact Sheet for Patients:   EntrepreneurPulse.com.au  Fact Sheet for Healthcare Providers:  IncredibleEmployment.be  This test is no t yet approved or cleared by the [place] FDA and  has been authorized for detection and/or diagnosis of SARS-CoV-2 by FDA under an Emergency Use Authorization (EUA). This EUA will remain  in effect (meaning this test can be used)  for the duration of the COVID-19 declaration under Section 564(b)(1) of the Act, 21 U.S.C.section 360bbb-3(b)(1), unless the authorization is terminated  or revoked sooner.       Influenza A by PCR NEGATIVE NEGATIVE Final   Influenza B by PCR NEGATIVE NEGATIVE Final    Comment: (NOTE) The Xpert Xpress SARS-CoV-2/FLU/RSV plus assay is intended as an aid in the diagnosis of influenza from Nasopharyngeal swab specimens and should not be used as a sole basis for treatment. Nasal washings and aspirates are unacceptable for Xpert Xpress SARS-CoV-2/FLU/RSV testing.  Fact Sheet for Patients: EntrepreneurPulse.com.au  Fact Sheet for Healthcare Providers: IncredibleEmployment.be  This test is not yet approved or cleared by the [place] FDA and has been authorized for detection and/or diagnosis of SARS-CoV-2 by FDA under an Emergency Use Authorization (EUA). This EUA will remain in effect (meaning this test can be used) for the duration of the COVID-19 declaration under Section 564(b)(1) of the Act, 21 U.S.C. section 360bbb-3(b)(1), unless the authorization is terminated or revoked.  Performed at [facility], [address] [place], [place] [address]   Culture, blood (routine x 2)     Status: None (Preliminary result)   Collection Time: 03/17/21  9:46 PM   Specimen: BLOOD  Result Value Ref Range Status   Specimen Description BLOOD RW  Final   Special Requests BOTTLES DRAWN AEROBIC AND ANAEROBIC BCLV  Final   Culture   Final    NO GROWTH 2 DAYS Performed at  [facility], [address] [place], [place] [address]    Report Status PENDING  Incomplete  Culture, blood (routine x 2)     Status: None (Preliminary result)   Collection Time: 03/17/21  9:46 PM   Specimen: BLOOD  Result Value Ref Range Status   Specimen Description BLOOD RFOA  Final   Special Requests BOTTLES DRAWN AEROBIC AND ANAEROBIC BCLV  Final   Culture   Final    NO GROWTH 2 DAYS Performed at [facility], [address] [place], [place] [address]    Report Status PENDING  Incomplete  MRSA Next Gen by PCR, Nasal     Status: None   Collection Time: 03/18/21 12:10 AM   Specimen: Urine, Clean Catch; Nasal Swab  Result Value Ref Range Status   MRSA by PCR Next Gen NOT DETECTED NOT DETECTED Final    Comment: (NOTE) The GeneXpert MRSA Assay (FDA approved for NASAL specimens only), is one component of a comprehensive MRSA colonization surveillance program. It is not intended to diagnose MRSA infection nor to guide or monitor treatment for MRSA infections. Test performance is not FDA approved in patients less than 15 years old. Performed at [facility], [address] [place], [place] [address]      Time coordinating discharge: Over 30 minutes  SIGNED:   Sidney Ace, MD  [facility] 03/19/2021, 3:26 PM Pager   If 7PM-7AM, please contact night-coverage

## 2021-03-19 NOTE — Plan of Care (Signed)
Problem: Activity: Goal: Ability to tolerate increased activity will improve Outcome: Completed/Met   Problem: Clinical Measurements: Goal: Ability to maintain a body temperature in the normal range will improve Outcome: Completed/Met   Problem: Respiratory: Goal: Ability to maintain adequate ventilation will improve Outcome: Completed/Met Goal: Ability to maintain a clear airway will improve Outcome: Completed/Met   Problem: Education: Goal: Knowledge of General Education information will improve Description: Including pain rating scale, medication(s)/side effects and non-pharmacologic comfort measures Outcome: Completed/Met   Problem: Health Behavior/Discharge Planning: Goal: Ability to manage health-related needs will improve Outcome: Completed/Met   Problem: Clinical Measurements: Goal: Ability to maintain clinical measurements within normal limits will improve Outcome: Completed/Met Goal: Will remain free from infection Outcome: Completed/Met Goal: Diagnostic test results will improve Outcome: Completed/Met Goal: Respiratory complications will improve Outcome: Completed/Met Goal: Cardiovascular complication will be avoided Outcome: Completed/Met   Problem: Activity: Goal: Risk for activity intolerance will decrease Outcome: Completed/Met   Problem: Nutrition: Goal: Adequate nutrition will be maintained Outcome: Completed/Met   Problem: Coping: Goal: Level of anxiety will decrease Outcome: Completed/Met   Problem: Elimination: Goal: Will not experience complications related to bowel motility Outcome: Completed/Met Goal: Will not experience complications related to urinary retention Outcome: Completed/Met   Problem: Pain Managment: Goal: General experience of comfort will improve Outcome: Completed/Met   Problem: Safety: Goal: Ability to remain free from injury will improve Outcome: Completed/Met   Problem: Skin Integrity: Goal: Risk for impaired  skin integrity will decrease Outcome: Completed/Met

## 2021-03-22 LAB — CULTURE, BLOOD (ROUTINE X 2)
Culture: NO GROWTH
Culture: NO GROWTH

## 2021-03-22 LAB — LEGIONELLA PNEUMOPHILA SEROGP 1 UR AG: L. pneumophila Serogp 1 Ur Ag: NEGATIVE

## 2021-04-13 ENCOUNTER — Encounter (HOSPITAL_COMMUNITY): Payer: Self-pay | Admitting: Radiology

## 2021-05-30 DIAGNOSIS — Z01818 Encounter for other preprocedural examination: Secondary | ICD-10-CM | POA: Insufficient documentation

## 2021-06-03 ENCOUNTER — Other Ambulatory Visit: Payer: BC Managed Care – PPO

## 2021-06-03 ENCOUNTER — Ambulatory Visit (LOCAL_COMMUNITY_HEALTH_CENTER): Payer: BC Managed Care – PPO

## 2021-06-03 ENCOUNTER — Telehealth: Payer: Self-pay

## 2021-06-03 VITALS — Ht 62.0 in | Wt 113.0 lb

## 2021-06-03 DIAGNOSIS — R7612 Nonspecific reaction to cell mediated immunity measurement of gamma interferon antigen response without active tuberculosis: Secondary | ICD-10-CM

## 2021-06-03 NOTE — Telephone Encounter (Signed)
TC from [facility] provider re: +QFT.  Patient on renal transplant list and needs LTBI tx. TB  RN will call patient.Aileen Fass, RN  ? ?Provider Dr. Servando Salina ? ?

## 2021-06-03 NOTE — Progress Notes (Signed)
04/21/21 [facility] CXR- care everywhere ?08/07/20 +QFT [facility]  ?End stage renal ?Referral sent via [place] ? ?EPI completed via phone today.  Discussed LTBI vs Active TB and treatment regimens. Patient scheduled to come in tomorrow morning for baseline labs.LTBI tx is recommended per Dr. Servando Salina [facility]). Patient is awaiting renal transplant. Aileen Fass, RN  ? ?Interpreter [place] ?Daughter assisted patient by reading medications to TB RN  ?Daughter's # [phone] ? ? ? ?

## 2021-06-04 ENCOUNTER — Other Ambulatory Visit: Payer: BC Managed Care – PPO

## 2021-06-04 VITALS — Wt 111.0 lb

## 2021-06-04 DIAGNOSIS — R7612 Nonspecific reaction to cell mediated immunity measurement of gamma interferon antigen response without active tuberculosis: Secondary | ICD-10-CM

## 2021-06-04 LAB — HM HIV SCREENING LAB: HM HIV Screening: NEGATIVE

## 2021-06-04 NOTE — Progress Notes (Addendum)
In clinic for lab only today.  Discussed with patient and husband extensively re: TB and LTBI treatment regimens.  Patient thinks she wants to do Rifampin x 4 months.  ?Informed Dr. Lolita Lenz will call next week with results and will go from there.Aileen Fass, RN  ? ?Per patient, it is best to call daughters phone  ?[phone] ?

## 2021-06-05 LAB — CBC WITH DIFFERENTIAL/PLATELET
Basophils Absolute: 0 10*3/uL (ref 0.0–0.2)
Basos: 1 %
EOS (ABSOLUTE): 0.1 10*3/uL (ref 0.0–0.4)
Eos: 2 %
Hematocrit: 33.2 % — ABNORMAL LOW (ref 34.0–46.6)
Hemoglobin: 10.9 g/dL — ABNORMAL LOW (ref 11.1–15.9)
Immature Grans (Abs): 0 10*3/uL (ref 0.0–0.1)
Immature Granulocytes: 0 %
Lymphocytes Absolute: 1.5 10*3/uL (ref 0.7–3.1)
Lymphs: 43 %
MCH: 32 pg (ref 26.6–33.0)
MCHC: 32.8 g/dL (ref 31.5–35.7)
MCV: 97 fL (ref 79–97)
Monocytes Absolute: 0.4 10*3/uL (ref 0.1–0.9)
Monocytes: 11 %
Neutrophils Absolute: 1.5 10*3/uL (ref 1.4–7.0)
Neutrophils: 43 %
Platelets: 180 10*3/uL (ref 150–450)
RBC: 3.41 x10E6/uL — ABNORMAL LOW (ref 3.77–5.28)
RDW: 14.9 % (ref 11.7–15.4)
WBC: 3.5 10*3/uL (ref 3.4–10.8)

## 2021-06-05 LAB — COMPREHENSIVE METABOLIC PANEL
ALT: 12 IU/L (ref 0–32)
AST: 16 IU/L (ref 0–40)
Albumin/Globulin Ratio: 2.2 (ref 1.2–2.2)
Albumin: 5 g/dL — ABNORMAL HIGH (ref 3.8–4.9)
Alkaline Phosphatase: 128 IU/L — ABNORMAL HIGH (ref 44–121)
BUN/Creatinine Ratio: 7 — ABNORMAL LOW (ref 9–23)
BUN: 35 mg/dL — ABNORMAL HIGH (ref 6–24)
Bilirubin Total: 0.4 mg/dL (ref 0.0–1.2)
CO2: 25 mmol/L (ref 20–29)
Calcium: 9 mg/dL (ref 8.7–10.2)
Chloride: 90 mmol/L — ABNORMAL LOW (ref 96–106)
Creatinine, Ser: 4.75 mg/dL — ABNORMAL HIGH (ref 0.57–1.00)
Globulin, Total: 2.3 g/dL (ref 1.5–4.5)
Glucose: 80 mg/dL (ref 70–99)
Potassium: 4.7 mmol/L (ref 3.5–5.2)
Sodium: 140 mmol/L (ref 134–144)
Total Protein: 7.3 g/dL (ref 6.0–8.5)
eGFR: 10 mL/min/{1.73_m2} — ABNORMAL LOW (ref >59)

## 2021-06-07 ENCOUNTER — Other Ambulatory Visit: Payer: BC Managed Care – PPO

## 2021-06-10 ENCOUNTER — Telehealth: Payer: Self-pay | Admitting: Surgery

## 2021-06-10 DIAGNOSIS — R7612 Nonspecific reaction to cell mediated immunity measurement of gamma interferon antigen response without active tuberculosis: Secondary | ICD-10-CM

## 2021-06-10 NOTE — Telephone Encounter (Signed)
Tried cell and then home phone w [place] interpreter. Husband (presumably) answered home phone but then hung up on [place] before interpreter could explain why we were calling. Left voicemail about setting up appt for next week for start tx LTBI. ? ?Will try again on Monday to discuss tx for LTBI and setup appt. ? ?Leigh Aurora, MD  ?

## 2021-06-10 NOTE — Telephone Encounter (Signed)
Daughter texted back about missed called.  ? ?Daughter is main point of contact for this patient, [place] speaking, [phone] cell. ? ?Family decided on appt Friday 4/14 8:20am for start Rifampin tx LTBI. Pt [place] prefers daily treatment, undergoes HD Tu Th Sat in mornings, take all medications after HD.  ? ?Leigh Aurora, MD  ?

## 2021-06-14 ENCOUNTER — Other Ambulatory Visit: Payer: Self-pay | Admitting: Surgery

## 2021-06-14 NOTE — Progress Notes (Addendum)
Patient speaks spanish, daughter is best contact, speaks english; husband may be with patient, spanish speaking. ? ?+QFT: 08/07/20 ?CXR: negative  04/21/2021 ?EPI: 06/03/2021 ? ?HIV: pending  ?Syphilis: pending  ?CBC: abnormal, patient is ESRD on HD in AM on T TH Sat; CBC will be abnormal and checked weekly at HD clinic ?Renal numbers abnormal as expected ?LFTs: ALT/AST and TB wnl; slight elevation in AlkP ok  ? ?Tuberculosis treatment orders  ?All patients are to be monitored per [place] and county TB policies.   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 Treat for latent TB per the following: ? ?Rifampin 600mg  daily by mouth x 4 months per Dr. Ernestina Patches.  ? ?Patient's family/patient instructed to take medication after HD. At present, patient always takes meds in PM, since HD is in AM. ? ?Baseline labs have been obtained, no need for labs at start visit.  ?Draw LFTs at all subsequent visits.  ? ?Leigh Aurora, MD  ?

## 2021-06-18 ENCOUNTER — Ambulatory Visit (LOCAL_COMMUNITY_HEALTH_CENTER): Payer: BC Managed Care – PPO

## 2021-06-18 VITALS — Wt 114.0 lb

## 2021-06-18 DIAGNOSIS — R7612 Nonspecific reaction to cell mediated immunity measurement of gamma interferon antigen response without active tuberculosis: Secondary | ICD-10-CM

## 2021-06-18 MED ORDER — RIFAMPIN 300 MG PO CAPS: 600.0000 mg | ORAL_CAPSULE | Freq: Every day | ORAL | 0 refills | Status: AC

## 2021-06-18 NOTE — Progress Notes (Signed)
In Nurse Clinic with daughter. Daughter speaks [place]. Jerilynn Mages [place] interpreter today. Here for LTBI / TB Med Start / Rifampin #1 ? ?Pt awaiting renal transplant and is on dialysis 3x/week. F-u by [facility]. Motivated to complete LTBI tx.  ? ?Latent vs Active TB info sheet given and reviewed. ?Rifampin info sheet given and reviewed. ?LTBI consent signed. ?ROI signed. Consult Dr Westley Foots who says no need to fax PCP letter at this time to pt's provider as she will be contacting pt's provider.  ?TB contact card given. ? ?Rifampin 300 mg #60 dispensed per order by Dr Lubertha Sayres dated 06/14/2021. Instructions explained to take 2 capsules daily by mouth for 4 months.  ?Advised to contact [facility] with questions, concerns, side effects.  ? ?Next TB med appt given 07/16/2021 at 8:20 am, has reminder. Josie Saunders, RN ? ?

## 2021-06-25 ENCOUNTER — Telehealth: Payer: Self-pay | Admitting: Surgery

## 2021-06-25 DIAGNOSIS — R7612 Nonspecific reaction to cell mediated immunity measurement of gamma interferon antigen response without active tuberculosis: Secondary | ICD-10-CM

## 2021-06-25 NOTE — Telephone Encounter (Signed)
Spoke with Servando Salina, MD ID fellow at [facility]. [phone] (cell?) ? ?Informed her Carol Hicks started treatment for LTBI with Rifampin 600mg  daily for 4 months. ? ?We will check LFTs monthly, her CBCs should be checked at her HD visits regularly. ? ?She will follow up with their pharmacist regarding any changes to mycophenolate dosing since RIF will decrease its effectiveness. She said she will contact me if anything we need to do on our end. ? ?Leigh Aurora, MD  ?

## 2021-07-16 ENCOUNTER — Ambulatory Visit (LOCAL_COMMUNITY_HEALTH_CENTER): Payer: Self-pay

## 2021-07-16 VITALS — Wt 113.0 lb

## 2021-07-16 DIAGNOSIS — R7612 Nonspecific reaction to cell mediated immunity measurement of gamma interferon antigen response without active tuberculosis: Secondary | ICD-10-CM | POA: Insufficient documentation

## 2021-07-16 MED ORDER — RIFAMPIN 300 MG PO CAPS: 600.0000 mg | ORAL_CAPSULE | Freq: Every day | ORAL | 0 refills | Status: AC

## 2021-07-17 LAB — HEPATIC FUNCTION PANEL
ALT: 10 IU/L (ref 0–32)
AST: 12 IU/L (ref 0–40)
Albumin: 4.6 g/dL (ref 3.8–4.9)
Alkaline Phosphatase: 144 IU/L — ABNORMAL HIGH (ref 44–121)
Bilirubin Total: 0.3 mg/dL (ref 0.0–1.2)
Bilirubin, Direct: 0.13 mg/dL (ref 0.00–0.40)
Total Protein: 7 g/dL (ref 6.0–8.5)

## 2021-07-19 NOTE — Progress Notes (Signed)
Patient in clinic this am with her daughter for #2 Rifampin. Daughter reports that patient's BP has been up somewhat compared to her usual readings. States BP is typically in the 140s and has had several days that BP was in the 160s. Denies any changes in meds or dialysis.  Discussed readings with Dr. Lolita Lenz, no significant concerns at this time. TB RN instructed patient and daughter if readings continue to be elevated touch base with PCP.  ?Rifampin 300mg  #60 dispensed per Lolita Lenz order. Aileen Fass, RN   ? ?Next TB appt 08/13/21 ?

## 2021-08-13 ENCOUNTER — Ambulatory Visit (LOCAL_COMMUNITY_HEALTH_CENTER): Payer: Self-pay

## 2021-08-13 VITALS — Wt 111.5 lb

## 2021-08-13 DIAGNOSIS — R7612 Nonspecific reaction to cell mediated immunity measurement of gamma interferon antigen response without active tuberculosis: Secondary | ICD-10-CM

## 2021-08-13 MED ORDER — RIFAMPIN 300 MG PO CAPS: 600.0000 mg | ORAL_CAPSULE | Freq: Every day | ORAL | 0 refills | Status: AC

## 2021-08-13 NOTE — Progress Notes (Signed)
In Nurse [facility] for LTBI / TB Med / Rifampin #3. Pt's husband and daughter (who speaks [place]) present for appt. [employer] [phone] used when needed today.  Voices no concerns today. Reports BP is better this month and readings are in the 130's/140's.  No changes in medical condition/no changes in meds. 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   Pt explains she has not missed any Rifampin and takes as prescribed. Has 8 pills remaining in current bottle. RN counseled to complete current Rifampin bottle as prescribed, then may start Rifampin bottle #3.   Rifampin 300 mg #60 dispensed today (Rifampin #3) per order Dr Lenna Sciara Nunez/ Dr Lubertha Sayres order. Instructions reviewed. Questions answered and reports understanding.  Advised to contact [facility] with concerns, questions, side effects. TB coord contact card and Rifampin info sheet given.   RN walked pt to lab for LFT's. Next TB Med appt for TB med completion/Rifampin #4 scheduled 09/10/21 at 8:20 am. Has reminder. Josie Saunders, RN

## 2021-08-14 LAB — HEPATIC FUNCTION PANEL
ALT: 11 IU/L (ref 0–32)
AST: 19 IU/L (ref 0–40)
Albumin: 4.5 g/dL (ref 3.8–4.9)
Alkaline Phosphatase: 156 IU/L — ABNORMAL HIGH (ref 44–121)
Bilirubin Total: 0.3 mg/dL (ref 0.0–1.2)
Bilirubin, Direct: <0.1 mg/dL (ref 0.00–0.40)
Total Protein: 7.1 g/dL (ref 6.0–8.5)

## 2021-09-10 ENCOUNTER — Ambulatory Visit (LOCAL_COMMUNITY_HEALTH_CENTER): Payer: Self-pay | Admitting: Surgery

## 2021-09-10 VITALS — Wt 110.5 lb

## 2021-09-10 DIAGNOSIS — R7612 Nonspecific reaction to cell mediated immunity measurement of gamma interferon antigen response without active tuberculosis: Secondary | ICD-10-CM

## 2021-09-10 MED ORDER — RIFAMPIN 300 MG PO CAPS: 600.0000 mg | ORAL_CAPSULE | Freq: Every day | ORAL | 0 refills | Status: AC

## 2021-09-10 NOTE — Progress Notes (Signed)
Language: Spanish; Language line    Patient has been taking Rifampin 600 mg daily for 3 months for LTBI treatment.  Patient is doing well on current medication regimen. No n/v/f/c, eating normally, no concerning weight loss. Patient reports taking medications daily as prescribed, patient has 6 more days of pills.  Dispensed #4 and final month of Rifampin for LTBI tx. Dispensed #60, 300 mg capsules.   Patient walked to lab for LFTs, will follow up.  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 If patient is asked by employer, school, or other institution to take a TB test, patient should supply proof of treatment completion and/or obtain a TB Screening at the health department or at patient's PCP. This information was also provided in writing in the patient's completion letter which was given to the patient today along with a TB treatment completion card.   A completion letter was also sent by fax to the patient's PCP/other providers as follows:  Dr. Oneal [facility] Fax: [phone]  All completion letters and completion card were sent for scanning into Epic.  Patient was advised to contact [facility]/TB control phone for any concerning symptoms or questions.    Leigh Aurora, MD

## 2021-09-11 LAB — HEPATIC FUNCTION PANEL
ALT: 7 IU/L (ref 0–32)
AST: 16 IU/L (ref 0–40)
Albumin: 4.5 g/dL (ref 3.8–4.9)
Alkaline Phosphatase: 149 IU/L — ABNORMAL HIGH (ref 44–121)
Bilirubin Total: 0.2 mg/dL (ref 0.0–1.2)
Bilirubin, Direct: <0.1 mg/dL (ref 0.00–0.40)
Total Protein: 6.9 g/dL (ref 6.0–8.5)

## 2021-11-04 ENCOUNTER — Other Ambulatory Visit: Payer: Self-pay | Admitting: Internal Medicine

## 2021-11-04 DIAGNOSIS — Z1231 Encounter for screening mammogram for malignant neoplasm of breast: Secondary | ICD-10-CM

## 2021-12-01 ENCOUNTER — Ambulatory Visit
Admission: RE | Admit: 2021-12-01 | Discharge: 2021-12-01 | Disposition: A | Discharge status: Home / Self Care | Payer: BC Managed Care – PPO | Source: Ambulatory Visit | Attending: Internal Medicine | Admitting: Internal Medicine

## 2021-12-01 DIAGNOSIS — Z1231 Encounter for screening mammogram for malignant neoplasm of breast: Secondary | ICD-10-CM | POA: Diagnosis not present

## 2022-01-03 ENCOUNTER — Encounter (INDEPENDENT_AMBULATORY_CARE_PROVIDER_SITE_OTHER): Payer: Self-pay

## 2022-05-22 IMAGING — MG DIGITAL DIAGNOSTIC BILAT W/ TOMO W/ CAD
6 of 11 series · 6 of 35 positions shown · non-contrast
Comparison: Previous exam(s).

CLINICAL DATA: Palpable lump in the right breast

EXAM:
DIGITAL DIAGNOSTIC BILATERAL MAMMOGRAM WITH TOMOSYNTHESIS AND CAD;
ULTRASOUND RIGHT BREAST LIMITED
TECHNIQUE: Bilateral digital diagnostic mammography and breast tomosynthesis
was performed. The images were evaluated with computer-aided
detection.; Targeted ultrasound examination of the right breast was
performed

[R MLO synth-2D]
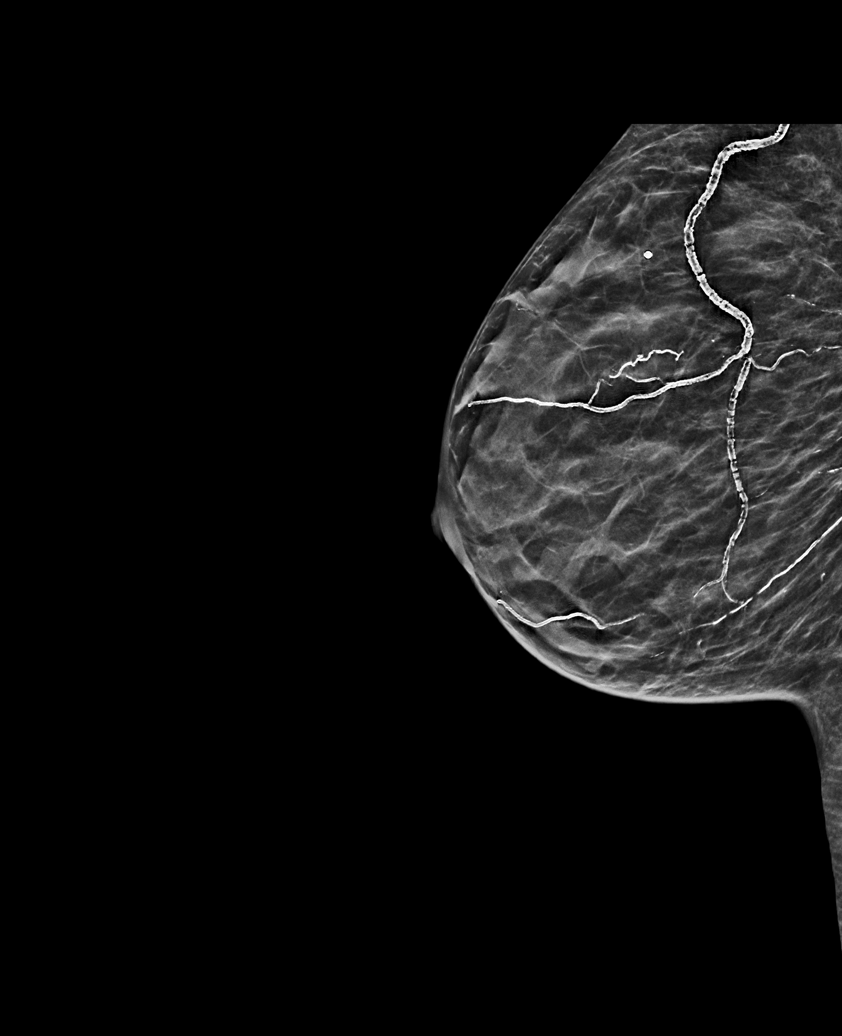

[L CC synth-2D]
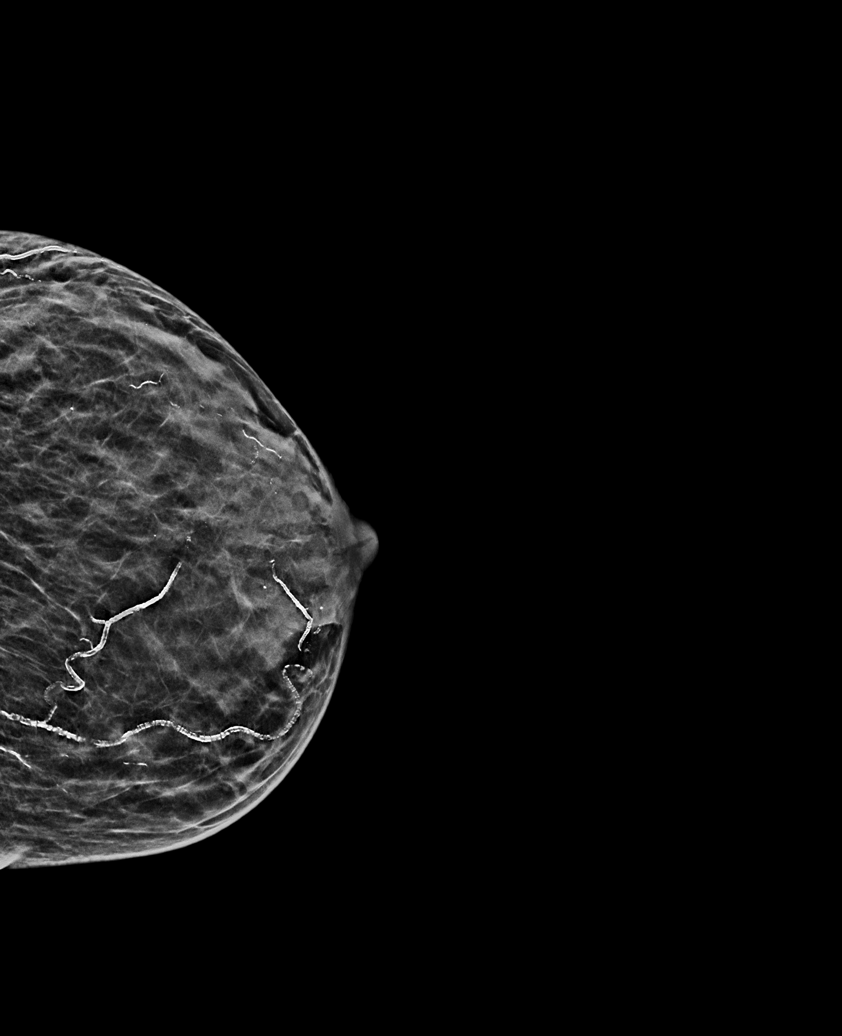

[R CC synth-2D]
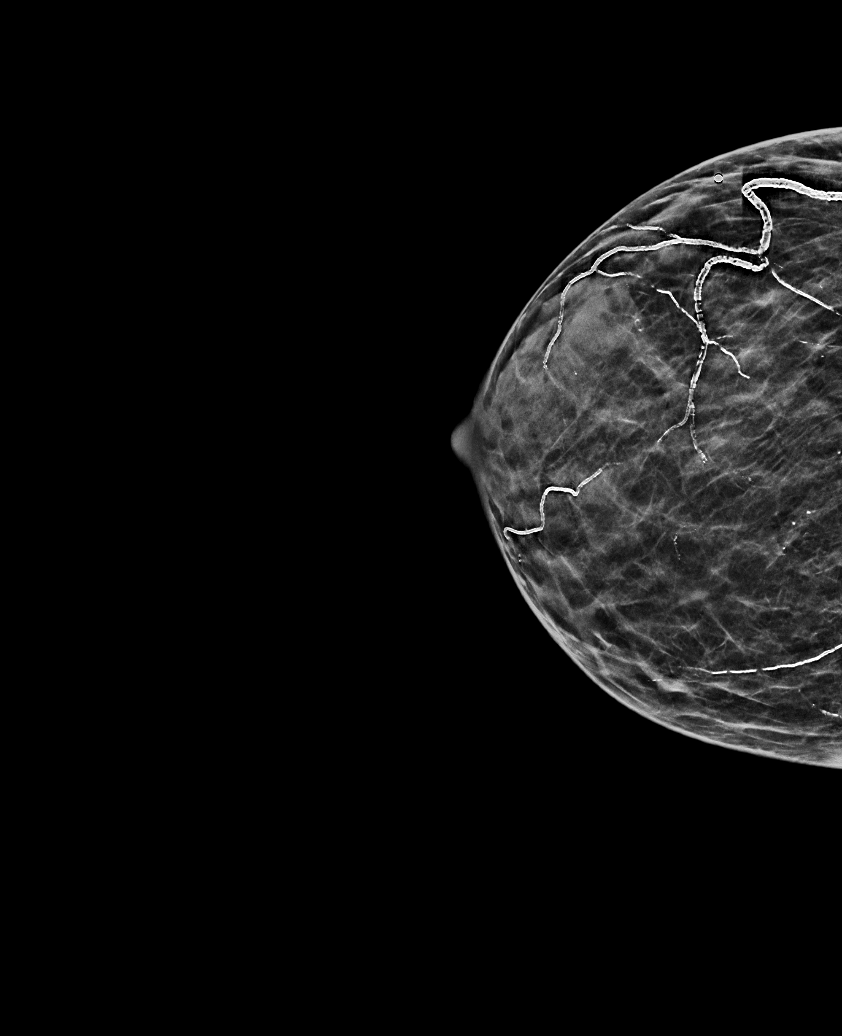

[L XCCL synth-2D]
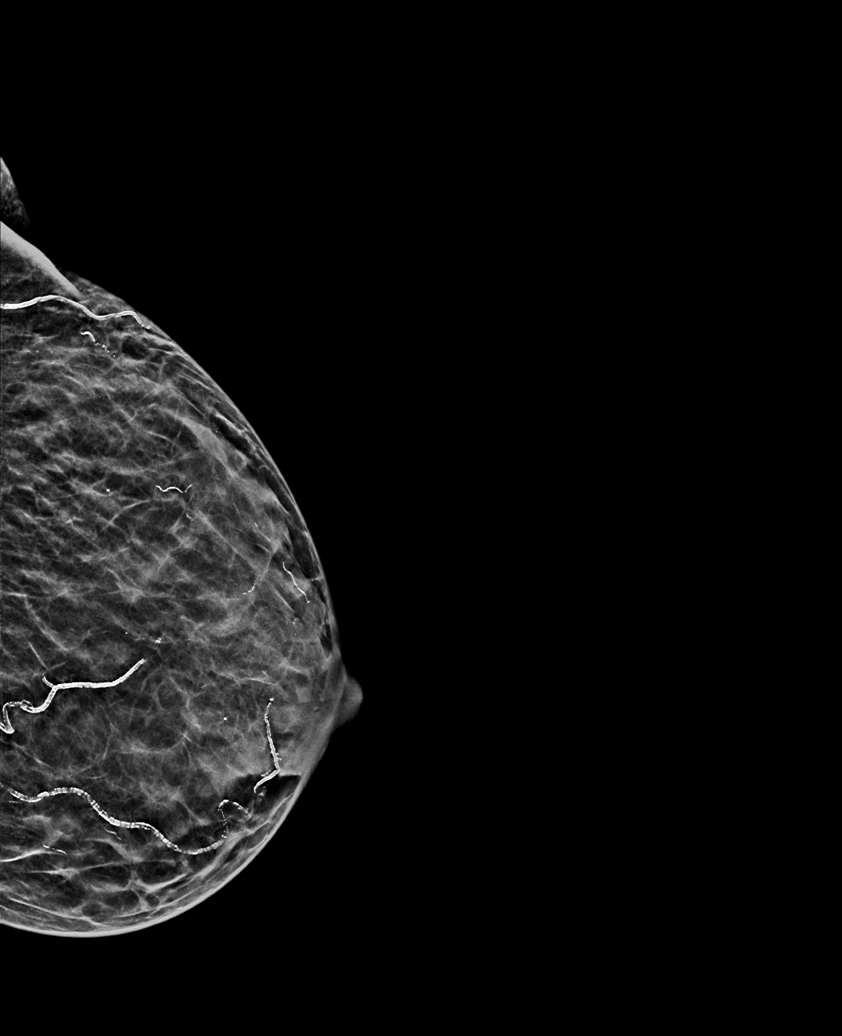

[L MLO synth-2D]
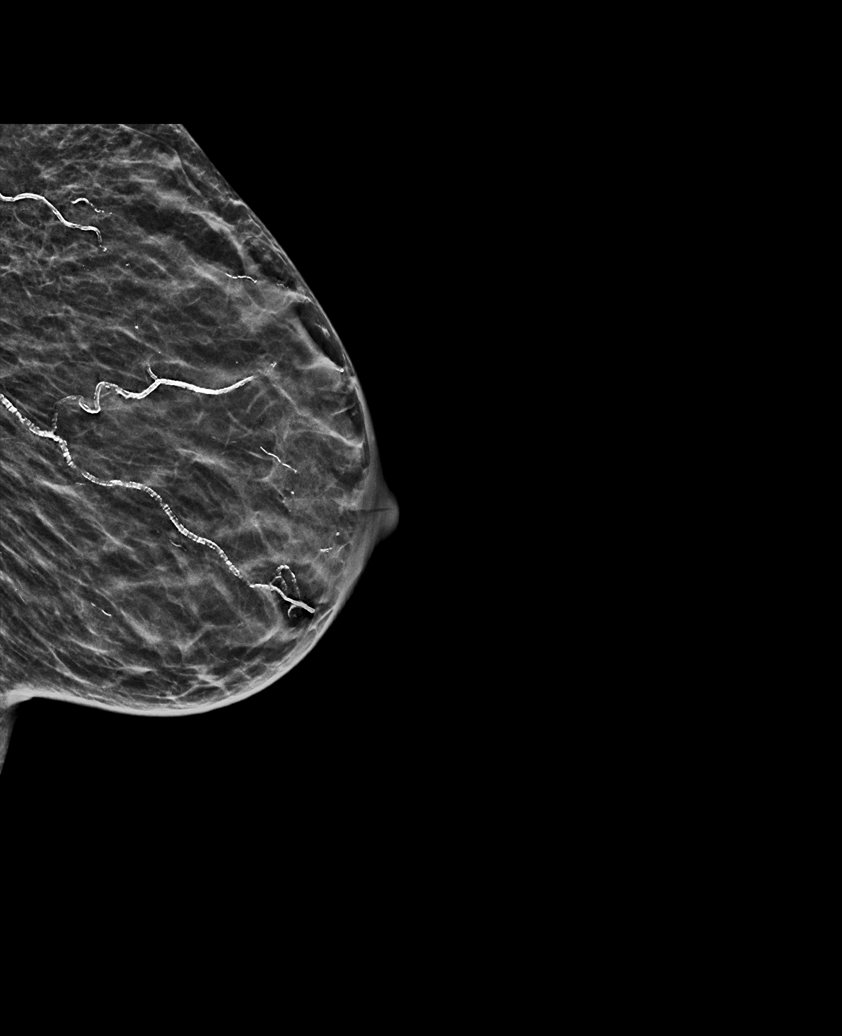

[L CC tomo · tomo slice 21/42.0]
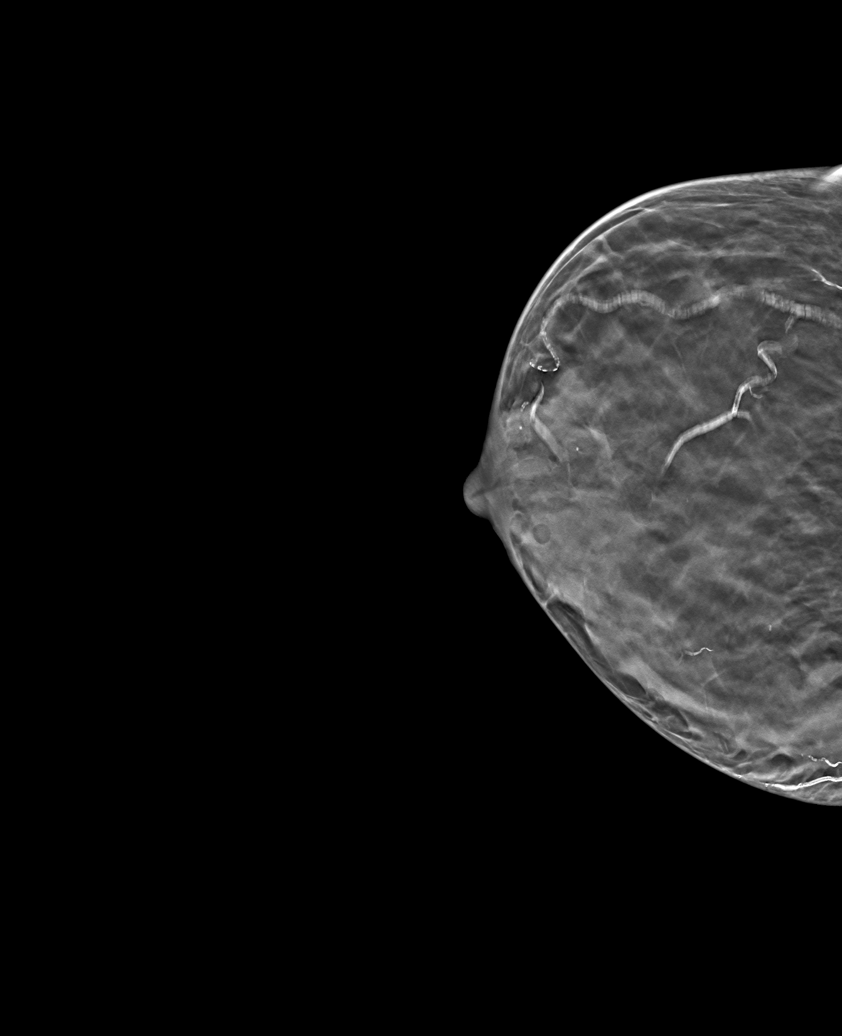

[6 of 35 positions shown; findings below may reference images not displayed]

ACR Breast Density Category c: The breast tissue is heterogeneously
dense, which may obscure small masses.
FINDINGS: No suspicious masses, calcifications, or distortion are identified
in either breast.

On physical exam, no suspicious lumps are identified.

Targeted ultrasound is performed, showing dense glandular tissue
with no suspicious mass in the region of the palpable lump felt by
the patient's physician.
IMPRESSION: No mammographic or sonographic evidence of malignancy.

RECOMMENDATION:
Treatment of the lump felt by the patient's physician should be
based on clinical and physical exam given lack of imaging findings.
Recommend annual screening mammography.

I have discussed the findings and recommendations with the patient.
If applicable, a reminder letter will be sent to the patient
regarding the next appointment.

BI-RADS CATEGORY  1: Negative.

## 2022-09-09 IMAGING — CR DG CHEST 2V
2 series · 2 of 2 positions shown · non-contrast
Comparison: Radiographs 06/11/2020.

CLINICAL DATA: Shortness of breath. Tested positive for COVID 2
weeks ago.

EXAM:
CHEST - 2 VIEW

[chest pa]
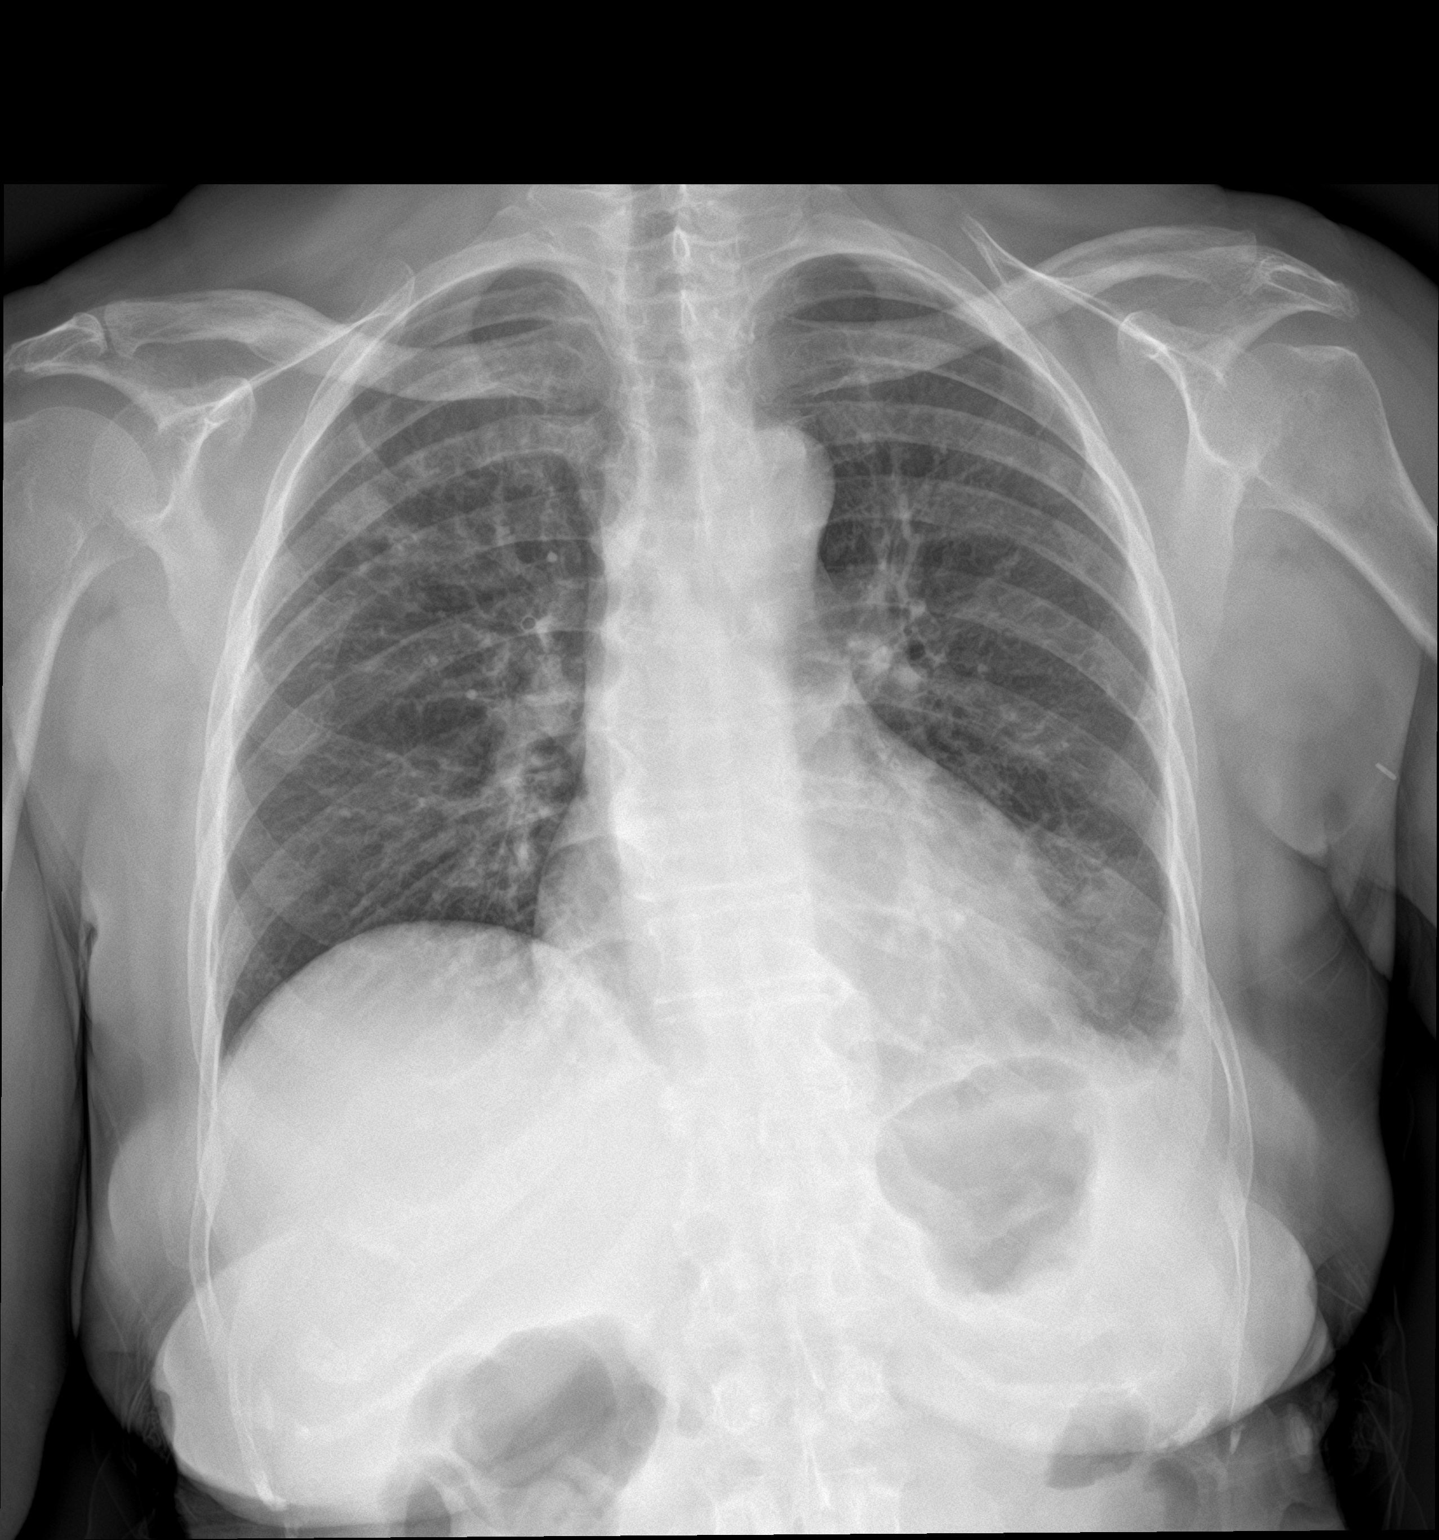

[chest lat]
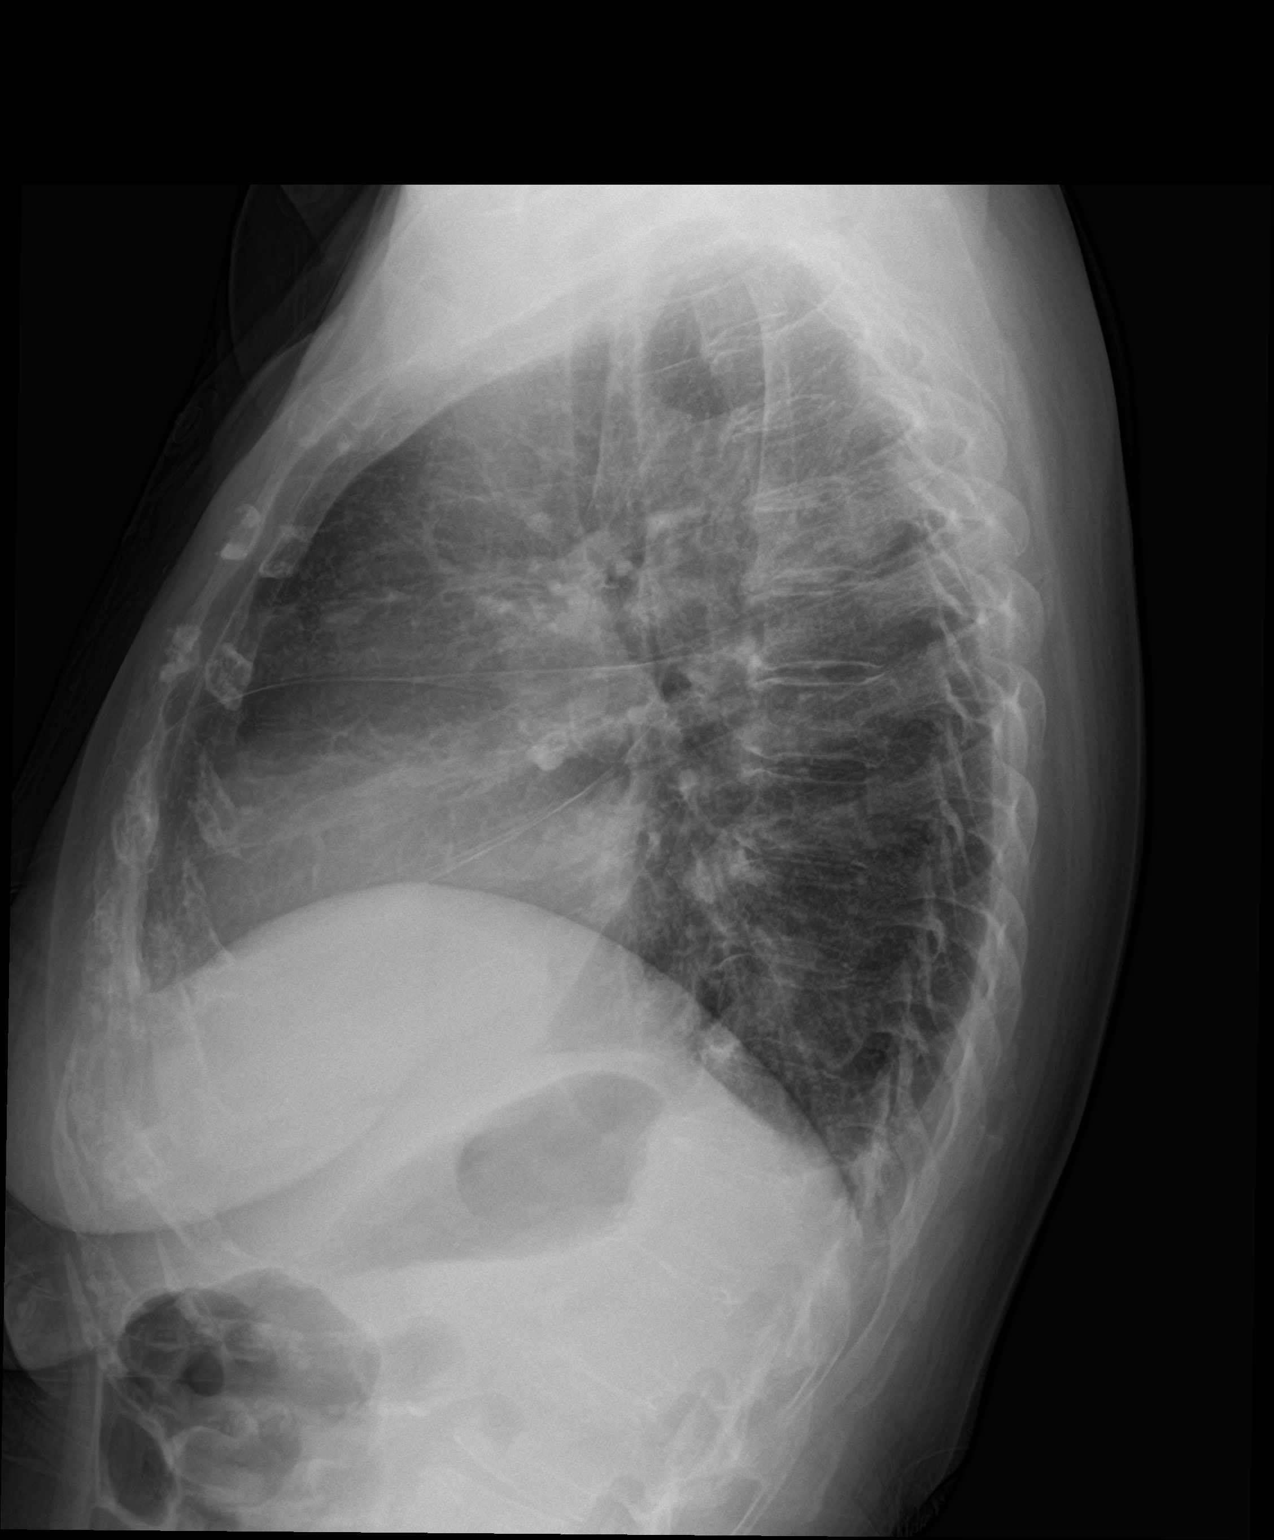

[2 of 2 positions shown; findings below may reference images not displayed]

FINDINGS: The heart size and mediastinal contours are stable without definite
adenopathy. There is interval improved aeration of the retrocardiac
left lower lobe with residual patchy airspace disease and blunting
of the left costophrenic angle. There is also patchy airspace
opacity projecting over the right upper lobe. No consolidation,
large pleural effusion or pneumothorax. There are degenerative
changes within the spine.
IMPRESSION: Patchy right upper lobe and left basilar airspace opacities which
could reflect pneumonia. Followup PA and lateral chest X-ray is
recommended in 3-4 weeks following appropriate therapy to ensure
resolution and exclude underlying malignancy.

## 2022-12-07 ENCOUNTER — Other Ambulatory Visit: Payer: Self-pay

## 2022-12-07 DIAGNOSIS — Z1231 Encounter for screening mammogram for malignant neoplasm of breast: Secondary | ICD-10-CM

## 2022-12-28 ENCOUNTER — Ambulatory Visit
Admission: RE | Admit: 2022-12-28 | Discharge: 2022-12-28 | Disposition: A | Discharge status: Home / Self Care | Payer: BC Managed Care – PPO | Source: Ambulatory Visit

## 2022-12-28 DIAGNOSIS — Z1231 Encounter for screening mammogram for malignant neoplasm of breast: Secondary | ICD-10-CM | POA: Insufficient documentation

## 2023-12-08 ENCOUNTER — Other Ambulatory Visit: Payer: Self-pay | Admitting: Internal Medicine

## 2023-12-08 DIAGNOSIS — Z1231 Encounter for screening mammogram for malignant neoplasm of breast: Secondary | ICD-10-CM

## 2024-01-10 ENCOUNTER — Ambulatory Visit

## 2024-02-19 ENCOUNTER — Ambulatory Visit

## 2024-03-25 ENCOUNTER — Ambulatory Visit
Admission: RE | Admit: 2024-03-25 | Discharge: 2024-03-25 | Disposition: A | Payer: Self-pay | Source: Ambulatory Visit | Attending: Internal Medicine | Admitting: Internal Medicine

## 2024-03-25 DIAGNOSIS — Z1231 Encounter for screening mammogram for malignant neoplasm of breast: Secondary | ICD-10-CM | POA: Diagnosis present
